# Patient Record
Sex: Female | Born: 1955 | Race: White | Hispanic: No | Marital: Married | Smoking: Never smoker
Health system: Southern US, Community
[De-identification: ages and names within clinical notes are randomized; demographics above are authoritative.]

## PROBLEM LIST (undated history)

## (undated) DIAGNOSIS — R0989 Other specified symptoms and signs involving the circulatory and respiratory systems: Secondary | ICD-10-CM

## (undated) DIAGNOSIS — J3089 Other allergic rhinitis: Secondary | ICD-10-CM

## (undated) DIAGNOSIS — I119 Hypertensive heart disease without heart failure: Secondary | ICD-10-CM

## (undated) DIAGNOSIS — E7800 Pure hypercholesterolemia, unspecified: Secondary | ICD-10-CM

## (undated) DIAGNOSIS — I10 Essential (primary) hypertension: Secondary | ICD-10-CM

## (undated) DIAGNOSIS — I5032 Chronic diastolic (congestive) heart failure: Secondary | ICD-10-CM

## (undated) DIAGNOSIS — D638 Anemia in other chronic diseases classified elsewhere: Secondary | ICD-10-CM

## (undated) HISTORY — PX: ABDOMINAL HYSTERECTOMY: SHX81

## (undated) HISTORY — PX: SHOULDER ARTHROSCOPY: SHX128

---

## 2017-05-12 ENCOUNTER — Observation Stay (HOSPITAL_BASED_OUTPATIENT_CLINIC_OR_DEPARTMENT_OTHER)
Admission: EM | Admit: 2017-05-12 | Discharge: 2017-05-13 | Disposition: A | Discharge status: Home / Self Care | Payer: 59 | Attending: Internal Medicine | Admitting: Internal Medicine

## 2017-05-12 ENCOUNTER — Encounter (HOSPITAL_BASED_OUTPATIENT_CLINIC_OR_DEPARTMENT_OTHER): Payer: Self-pay | Admitting: Emergency Medicine

## 2017-05-12 ENCOUNTER — Emergency Department (HOSPITAL_BASED_OUTPATIENT_CLINIC_OR_DEPARTMENT_OTHER): Payer: 59

## 2017-05-12 DIAGNOSIS — I119 Hypertensive heart disease without heart failure: Secondary | ICD-10-CM

## 2017-05-12 DIAGNOSIS — I110 Hypertensive heart disease with heart failure: Secondary | ICD-10-CM | POA: Diagnosis not present

## 2017-05-12 DIAGNOSIS — I509 Heart failure, unspecified: Secondary | ICD-10-CM

## 2017-05-12 DIAGNOSIS — R079 Chest pain, unspecified: Secondary | ICD-10-CM | POA: Diagnosis present

## 2017-05-12 DIAGNOSIS — D649 Anemia, unspecified: Secondary | ICD-10-CM | POA: Insufficient documentation

## 2017-05-12 DIAGNOSIS — K219 Gastro-esophageal reflux disease without esophagitis: Secondary | ICD-10-CM | POA: Diagnosis not present

## 2017-05-12 DIAGNOSIS — Z79899 Other long term (current) drug therapy: Secondary | ICD-10-CM | POA: Diagnosis not present

## 2017-05-12 DIAGNOSIS — R739 Hyperglycemia, unspecified: Secondary | ICD-10-CM | POA: Insufficient documentation

## 2017-05-12 DIAGNOSIS — R011 Cardiac murmur, unspecified: Secondary | ICD-10-CM | POA: Diagnosis not present

## 2017-05-12 DIAGNOSIS — R946 Abnormal results of thyroid function studies: Secondary | ICD-10-CM | POA: Diagnosis not present

## 2017-05-12 DIAGNOSIS — J9601 Acute respiratory failure with hypoxia: Secondary | ICD-10-CM | POA: Diagnosis not present

## 2017-05-12 DIAGNOSIS — I5031 Acute diastolic (congestive) heart failure: Secondary | ICD-10-CM | POA: Diagnosis not present

## 2017-05-12 DIAGNOSIS — E785 Hyperlipidemia, unspecified: Secondary | ICD-10-CM | POA: Diagnosis not present

## 2017-05-12 DIAGNOSIS — I517 Cardiomegaly: Secondary | ICD-10-CM

## 2017-05-12 DIAGNOSIS — I10 Essential (primary) hypertension: Secondary | ICD-10-CM | POA: Diagnosis present

## 2017-05-12 DIAGNOSIS — R0603 Acute respiratory distress: Secondary | ICD-10-CM | POA: Insufficient documentation

## 2017-05-12 HISTORY — DX: Pure hypercholesterolemia, unspecified: E78.00

## 2017-05-12 HISTORY — DX: Other allergic rhinitis: J30.89

## 2017-05-12 HISTORY — DX: Hypertensive heart disease without heart failure: I11.9

## 2017-05-12 HISTORY — DX: Essential (primary) hypertension: I10

## 2017-05-12 LAB — CBC
HCT: 31.9 % — ABNORMAL LOW (ref 36.0–46.0)
Hemoglobin: 10.6 g/dL — ABNORMAL LOW (ref 12.0–15.0)
MCH: 29.2 pg (ref 26.0–34.0)
MCHC: 33.2 g/dL (ref 30.0–36.0)
MCV: 87.9 fL (ref 78.0–100.0)
PLATELETS: 394 10*3/uL (ref 150–400)
RBC: 3.63 MIL/uL — AB (ref 3.87–5.11)
RDW: 13.5 % (ref 11.5–15.5)
WBC: 7.2 10*3/uL (ref 4.0–10.5)

## 2017-05-12 LAB — BASIC METABOLIC PANEL
ANION GAP: 10 (ref 5–15)
BUN: 24 mg/dL — ABNORMAL HIGH (ref 6–20)
CHLORIDE: 102 mmol/L (ref 101–111)
CO2: 26 mmol/L (ref 22–32)
Calcium: 10.1 mg/dL (ref 8.9–10.3)
Creatinine, Ser: 0.81 mg/dL (ref 0.44–1.00)
GFR calc non Af Amer: >60 mL/min (ref >60)
GLUCOSE: 112 mg/dL — AB (ref 65–99)
Potassium: 3.5 mmol/L (ref 3.5–5.1)
Sodium: 138 mmol/L (ref 135–145)

## 2017-05-12 LAB — D-DIMER, QUANTITATIVE: D-Dimer, Quant: 0.42 ug/mL-FEU (ref 0.00–0.50)

## 2017-05-12 LAB — BRAIN NATRIURETIC PEPTIDE: B NATRIURETIC PEPTIDE 5: 92.3 pg/mL (ref 0.0–100.0)

## 2017-05-12 LAB — TROPONIN I

## 2017-05-12 MED ORDER — FUROSEMIDE 10 MG/ML IJ SOLN
20.0000 mg | Freq: Once | INTRAMUSCULAR | Status: AC
  Administered 2017-05-12: 20 mg via INTRAVENOUS
  Filled 2017-05-12: qty 2

## 2017-05-12 NOTE — ED Triage Notes (Addendum)
Sent from [facility]. Intermittent centralized chest pain x 1 week with fatigue, exertional SOB. Reports nausea this morning. 3 hour flight 1 month ago, non smoker.

## 2017-05-12 NOTE — ED Notes (Signed)
ED Provider at bedside. 

## 2017-05-12 NOTE — ED Provider Notes (Signed)
[facility] Provider Note   CSN: [id number] Arrival date & time: 05/12/17  1714     History   Chief Complaint Chief Complaint  Patient presents with  . Chest Pain    HPI Hannah Colon is a 61 y.o. female.  Patient is a 61 yo female with PMH HTN, HLD who presents from PCP office with c/o SOB and CP. States that has over the past 1.5 weeks she has noticed that she has been getting more "tired and winded" and at the same time started having central intermittent chest pain that would go up to her throat that she describes as a burning sensation. Her chest pain is brought on after drinking coffee or alcohol and relieved with tums. Her SOB has been progressively worsening, she has been unable to walk short distances without significant dyspnea that is relieved by rest. States has some difficulty laying flat. No palpitations. No wheezing. Has no known h/o lung conditions, does not use inhalers and not on oxygen at home.      Past Medical History:  Diagnosis Date  . High cholesterol   . Hypertension     There are no active problems to display for this patient.   Past Surgical History:  Procedure Laterality Date  . ABDOMINAL HYSTERECTOMY    . SHOULDER ARTHROSCOPY Right     OB History    No data available       Home Medications    Prior to Admission medications   Medication Sig Start Date End Date Taking? Authorizing Provider  atorvastatin (LIPITOR) 20 MG tablet Take 20 mg by mouth daily.   Yes [provider]  hydrochlorothiazide (HYDRODIURIL) 25 MG tablet Take 25 mg by mouth daily.   Yes [provider]  losartan (COZAAR) 100 MG tablet Take 100 mg by mouth daily.   Yes [provider]    Family History No family history on file.  Maternal uncle - MI Maternal uncle - CVA Mother - heart murmur, asthma MGM - asthma   Social History Social History  Substance Use Topics  . Smoking status: Never Smoker  . Smokeless tobacco:  Never Used  . Alcohol use No  EtOH 4 drinks per week Recreational drug use - none    Allergies   Penicillins   Review of Systems Review of Systems  Constitutional: Positive for fatigue. Negative for chills, diaphoresis and fever.  HENT: Negative for congestion, rhinorrhea and sinus pressure.   Respiratory: Positive for shortness of breath. Negative for chest tightness and wheezing.   Cardiovascular: Positive for chest pain. Negative for palpitations.  Gastrointestinal: Negative for abdominal distention, abdominal pain, constipation, diarrhea, nausea and vomiting.  Genitourinary: Negative for dysuria and hematuria.     Physical Exam Updated Vital Signs BP 138/74   Pulse 78   Temp 98.5 F (36.9 C) (Oral)   Resp 18   Ht 5' (1.524 m)   Wt 63.5 kg (140 lb)   SpO2 97%   BMI 27.34 kg/m   Physical Exam  Constitutional: She is oriented to person, place, and time. She appears well-developed and well-nourished. No distress.  HENT:  Head: Normocephalic and atraumatic.  Nose: Nose normal.  Mouth/Throat: Oropharynx is clear and moist.  Eyes: Pupils are equal, round, and reactive to light. Conjunctivae and EOM are normal.  Neck: Normal range of motion. Neck supple. JVD present.  Cardiovascular: Normal rate, regular rhythm, normal heart sounds and intact distal pulses.   No murmur heard. Pulmonary/Chest: Effort normal and  breath sounds normal. She has no wheezes.  On 2L O2 via nasal cannula. + crackles bilaterally.  Abdominal: Soft. Bowel sounds are normal. She exhibits no distension and no mass. There is no tenderness. There is no rebound and no guarding.  Musculoskeletal: Normal range of motion. She exhibits no edema.  Neurological: She is alert and oriented to person, place, and time.  Skin: Skin is warm and dry. Capillary refill takes less than 2 seconds.  Psychiatric: She has a normal mood and affect.     ED Treatments / Results  Labs (all labs ordered are listed, but  only abnormal results are displayed) Labs Reviewed  BASIC METABOLIC PANEL - Abnormal; Notable for the following:       Result Value   Glucose, Bld 112 (*)    BUN 24 (*)    All other components within normal limits  CBC - Abnormal; Notable for the following:    RBC 3.63 (*)    Hemoglobin 10.6 (*)    HCT 31.9 (*)    All other components within normal limits  TROPONIN I  D-DIMER, QUANTITATIVE (NOT AT [facility])  BRAIN NATRIURETIC PEPTIDE  TROPONIN I  TROPONIN I  TROPONIN I  BASIC METABOLIC PANEL    EKG  EKG Interpretation None       Radiology Dg Chest 2 View  Result Date: 05/12/2017 CLINICAL DATA:  Chest pain and shortness of breath for 1 week. EXAM: CHEST  2 VIEW COMPARISON:  None. FINDINGS: Upper limits normal heart size noted. Mild interstitial prominence is present. There is no evidence of focal airspace disease, pulmonary edema, suspicious pulmonary nodule/mass, pleural effusion, or pneumothorax. No acute bony abnormalities are identified. IMPRESSION: Mild nonspecific interstitial prominence of uncertain chronicity. Upper limits normal heart size. Electronically Signed   By: Harmon PierJeffrey  Hu M.D.   On: 05/12/2017 18:03    Procedures Procedures (including critical care time)  Medications Ordered in ED Medications  furosemide (LASIX) injection 20 mg (not administered)     Initial Impression / Assessment and Plan / ED Course  I have reviewed the triage vital signs and the nursing notes.  Pertinent labs & imaging results that were available during my care of the patient were reviewed by me and considered in my medical decision making (see chart for details).   Patient is a 61 yo F here with SOB and CP.  She is hypoxic, desat to 89%, requiring 2L O2 via nasal cannula. Has b/l crackles, JVD, new heart murmur and elevated BNP c/w new onset CHF. EKG showing LVH and troponin neg. No concern for ACS. Chest pain appears to be reflux given burning quality and brought on by stomach  irritants (i.e. Caffeine an ETOH) and relieved by antacids. Ddimer neg so no concern for PE. Hospitalist team consulted for admission and accepted. Patient and husband initially requesting transfer via private vehicle so was offered choice of admission with transfer via [facility] or discharge home with outpatient follow up. Patient and husband opted for transfer via [facility] and admission to hospital since she is unable to ambulate to restroom here without dyspnea.   Final Clinical Impressions(s) / ED Diagnoses   Final diagnoses:  Acute congestive heart failure, unspecified heart failure type (HCC)  Gastroesophageal reflux disease, esophagitis presence not specified    New Prescriptions New Prescriptions   No medications on file      Leland HerYoo, Elsia J, DO 05/12/17 2054    Tilden Fossaees, Elizabeth, MD 05/14/17 1538

## 2017-05-12 NOTE — H&P (Signed)
History and Physical    Hannah KandBarbara Shimamoto ZOX:[id number] DOB: [birth year] DOA: 05/12/2017  PCP: Burnis MedinFulbright, Virginia E, PA-C Consultants:  [facility]; [facility] Patient coming from: Home - lives with husband and daughter; Jackey LogeOK: Husband, [phone]  Chief Complaint: SOB  HPI: Hannah Colon is a 61 y.o. female with medical history significant of HTN and HLD presenting with about 10 days of SOB.  Her chest started hurting when she would breathe real hard. Lives in an apartment and by the time she gets to the car or by the time she gets from the car into a building she is out of breath.  Se has to sit to catch her breath for 10-15 minutes for the last 3 days.  Very tired for about 1 1/2 weeks.  Came home from work last Thursday and went straight to bed.  She does describe substernal chest tightness when she breathes deeply or while she is exerting herself.  [facility]  She did not wear the O2 to ambulate to the bathroom and felt the need for it when she got back to bed.  Chest pressure has been on and off throughout.  She takes Tums when it bothers her with intermittent relief.  Went to PCP today and [facility]   ED Course: 89% on RA, new heart murmur, elevated BNP c/w new HCF.  EKG with LVH, troponin negative.  No concern for ACS.  Chest pain thought to be reflux.  D-dimer negative.  Review of Systems: As per HPI; otherwise review of systems reviewed and negative.   Ambulatory Status:   Ambulates without assistance  Past Medical History:  Diagnosis Date  . Environmental and seasonal allergies   . High cholesterol   . Hypertension     Past Surgical History:  Procedure Laterality Date  . ABDOMINAL HYSTERECTOMY    . SHOULDER ARTHROSCOPY Right     Social History   Social History  . Marital status: Married    Spouse name: N/A  . Number of children: N/A  . Years of education: N/A   Occupational History  . accounting    Social History Main Topics  .  Smoking status: Never Smoker  . Smokeless tobacco: Never Used  . Alcohol use 2.4 - 3.0 oz/week    4 - 5 Glasses of wine per week  . Drug use: No  . Sexual activity: Not on file   Other Topics Concern  . Not on file   Social History Narrative  . No narrative on file    Allergies  Allergen Reactions  . Penicillins Hives and Rash    Family History  Problem Relation Age of Onset  . Dementia Mother   . Asthma Maternal Grandmother   . Heart failure Maternal Uncle     Prior to Admission medications   Medication Sig Start Date End Date Taking? Authorizing Provider  atorvastatin (LIPITOR) 20 MG tablet Take 20 mg by mouth daily.   Yes [provider]  hydrochlorothiazide (HYDRODIURIL) 25 MG tablet Take 25 mg by mouth daily.   Yes [provider]  losartan (COZAAR) 100 MG tablet Take 100 mg by mouth daily.   Yes [provider]    Physical Exam: Vitals:   05/12/17 1800 05/12/17 1830 05/12/17 1930 05/12/17 2258  BP: 138/74 134/72 (!) 152/71 126/68  Pulse: 78 78 89 94  Resp: 18 (!) 22 14 16   Temp:    98.2 F (36.8 C)  TempSrc:  Oral  SpO2: 97% 99% 97% 95%  Weight:    62.6 kg (138 lb 0.1 oz)  Height:    5' (1.524 m)     General: Appears calm and comfortable and is NAD, on [place] O2 Eyes:  EOMI, normal lids, iris ENT:  grossly normal hearing, lips & tongue, mmm; appropriate dentition Neck:  no LAD, masses or thyromegaly; no carotid bruits.  Minimal radiation of murmur into right neck. Cardiovascular:  RRR, very loud 4-5/6 systolic murmur, no r/g. No LE edema.  Respiratory:   CTA bilaterally with no wheezes/rales/rhonchi.  Normal respiratory effort. Abdomen:  soft, NT, ND, NABS Back:   normal alignment, no CVAT Skin:  no rash or induration seen on limited exam Musculoskeletal:  grossly normal tone BUE/BLE, good ROM, no bony abnormality Lower extremity:  No LE edema.  Limited foot exam with no ulcerations.  2+ distal pulses. Psychiatric:  grossly  normal mood and affect, speech fluent and appropriate, AOx3 Neurologic:  CN 2-12 grossly intact, moves all extremities in coordinated fashion, sensation intact    Radiological Exams on Admission: Dg Chest 2 View  Result Date: 05/12/2017 CLINICAL DATA:  Chest pain and shortness of breath for 1 week. EXAM: CHEST  2 VIEW COMPARISON:  None. FINDINGS: Upper limits normal heart size noted. Mild interstitial prominence is present. There is no evidence of focal airspace disease, pulmonary edema, suspicious pulmonary nodule/mass, pleural effusion, or pneumothorax. No acute bony abnormalities are identified. IMPRESSION: Mild nonspecific interstitial prominence of uncertain chronicity. Upper limits normal heart size. Electronically Signed   By: Harmon Pier M.D.   On: 05/12/2017 18:03    EKG: Independently reviewed.  NSR with rate 92; LVH with no evidence of acute ischemia   Labs on Admission: I have personally reviewed the available labs and imaging studies at the time of the admission.  Pertinent labs:   Glucose 112 BUN 24/Creatinine 0.81/GFR >60 Hgb 10.6 D-dimer 0.42 Troponin <0.03 BNP 92.3   Assessment/Plan Principal Problem:   Acute respiratory failure with hypoxia (HCC) Active Problems:   Chest pain on exertion   Essential hypertension   Hyperlipidemia   Hyperglycemia   Heart murmur   Acute respiratory failure with hypoxia in conjunction with chest pain and prominent new murmur -Patient without smoking history or prior h/o respiratory failure presenting with worsening SOB and hypoxia  -Normal WBC count, no fever; will not give antibiotics at this time -Borderline BNP -Initial EKG with LVH -CXR with possible increased interstitial prominence -Also with chest pain that may be exertional in nature, substernal, comes and goes spontaneously -Very loud murmur that patient reports has never been described in the past -Ddx includes ACS/CAD; CHF (more likely diastolic dysfunction,  particularly given lack of LE edema and high-normal BNP); and valvular heart disease -Will place in observation status with telemetry -Will request echocardiogram -Will start ASA -Will continue Losartan -No beta blocker due to possible CHF decompensation -CHF order set utilized; may need CHF team consult but will hold until Echo results are available -Cardiology consultation in AM - request placed through [employer] message, patient NPO after midnight -Was given Lasix 20 mg x 1 in ER and will repeat with 20 mg q12h for now -Continue [place] O2 for now -Normal kidney function at this time, will follow -Repeat EKG in AM -Will r/o with serial troponins although doubt ACS based on symptoms and 10 days' duration of symptoms  HTN -Continue Losartan and HCTZ -Given concurrent Lasix dosing, closely monitor renal function and consider holding  HCTZ if creatinine rises  HLD -Continue Lipitor 20 mg for now -Check FLP  Hyperglycemia -May be stress response -Will follow with fasting AM labs and check A1c  DVT prophylaxis:  Lovenox Code Status: Full - confirmed with patient/family Family Communication: Husband present throughout evaluation Disposition Plan:  Home once clinically improved Consults called: Cardiology via [employer] message  Admission status: It is my clinical opinion that referral for OBSERVATION is reasonable and necessary in this patient based on the above information provided. The aforementioned taken together are felt to place the patient at high risk for further clinical deterioration. However it is anticipated that the patient may be medically stable for discharge from the hospital within 24 to 48 hours.    Jonah Blue MD [facility]  If note is complete, please contact covering daytime or nighttime physician. [email or web address] Password [email or web address]  05/13/2017, 12:27 AM

## 2017-05-13 ENCOUNTER — Observation Stay (HOSPITAL_BASED_OUTPATIENT_CLINIC_OR_DEPARTMENT_OTHER): Payer: 59

## 2017-05-13 ENCOUNTER — Encounter (HOSPITAL_COMMUNITY): Payer: Self-pay | Admitting: Cardiovascular Disease

## 2017-05-13 ENCOUNTER — Other Ambulatory Visit: Payer: Self-pay | Admitting: Student

## 2017-05-13 DIAGNOSIS — I5031 Acute diastolic (congestive) heart failure: Secondary | ICD-10-CM

## 2017-05-13 DIAGNOSIS — E7800 Pure hypercholesterolemia, unspecified: Secondary | ICD-10-CM | POA: Diagnosis not present

## 2017-05-13 DIAGNOSIS — R011 Cardiac murmur, unspecified: Secondary | ICD-10-CM | POA: Diagnosis present

## 2017-05-13 DIAGNOSIS — E785 Hyperlipidemia, unspecified: Secondary | ICD-10-CM | POA: Diagnosis present

## 2017-05-13 DIAGNOSIS — I10 Essential (primary) hypertension: Secondary | ICD-10-CM | POA: Diagnosis present

## 2017-05-13 DIAGNOSIS — R079 Chest pain, unspecified: Secondary | ICD-10-CM

## 2017-05-13 DIAGNOSIS — I361 Nonrheumatic tricuspid (valve) insufficiency: Secondary | ICD-10-CM

## 2017-05-13 DIAGNOSIS — J9601 Acute respiratory failure with hypoxia: Secondary | ICD-10-CM | POA: Diagnosis present

## 2017-05-13 DIAGNOSIS — R739 Hyperglycemia, unspecified: Secondary | ICD-10-CM | POA: Diagnosis present

## 2017-05-13 DIAGNOSIS — I517 Cardiomegaly: Secondary | ICD-10-CM | POA: Diagnosis not present

## 2017-05-13 DIAGNOSIS — I110 Hypertensive heart disease with heart failure: Secondary | ICD-10-CM | POA: Diagnosis not present

## 2017-05-13 DIAGNOSIS — K219 Gastro-esophageal reflux disease without esophagitis: Secondary | ICD-10-CM | POA: Diagnosis not present

## 2017-05-13 DIAGNOSIS — I119 Hypertensive heart disease without heart failure: Secondary | ICD-10-CM

## 2017-05-13 HISTORY — DX: Hypertensive heart disease without heart failure: I11.9

## 2017-05-13 LAB — HIV ANTIBODY (ROUTINE TESTING W REFLEX): HIV SCREEN 4TH GENERATION: NONREACTIVE

## 2017-05-13 LAB — LIPID PANEL
Cholesterol: 173 mg/dL (ref 0–200)
HDL: 48 mg/dL (ref >40)
LDL CALC: 70 mg/dL (ref 0–99)
TRIGLYCERIDES: 275 mg/dL — AB (ref <150)
Total CHOL/HDL Ratio: 3.6 RATIO
VLDL: 55 mg/dL — ABNORMAL HIGH (ref 0–40)

## 2017-05-13 LAB — TROPONIN I

## 2017-05-13 LAB — HEMOGLOBIN A1C
HEMOGLOBIN A1C: 5.2 % (ref 4.8–5.6)
MEAN PLASMA GLUCOSE: 102.54 mg/dL

## 2017-05-13 LAB — T4, FREE: FREE T4: 0.92 ng/dL (ref 0.61–1.12)

## 2017-05-13 LAB — ECHOCARDIOGRAM COMPLETE
HEIGHTINCHES: 60 in
WEIGHTICAEL: 2229.29 [oz_av]

## 2017-05-13 LAB — TSH: TSH: 5.387 u[IU]/mL — AB (ref 0.350–4.500)

## 2017-05-13 MED ORDER — FLUTICASONE PROPIONATE 50 MCG/ACT NA SUSP
1.0000 | Freq: Every day | NASAL | Status: DC
  Administered 2017-05-13: 1 via NASAL
  Filled 2017-05-13: qty 16

## 2017-05-13 MED ORDER — AZELASTINE HCL 0.1 % NA SOLN
1.0000 | Freq: Two times a day (BID) | NASAL | Status: DC
  Administered 2017-05-13 (×2): 1 via NASAL
  Filled 2017-05-13: qty 30

## 2017-05-13 MED ORDER — FUROSEMIDE 10 MG/ML IJ SOLN
20.0000 mg | Freq: Two times a day (BID) | INTRAMUSCULAR | Status: DC
  Administered 2017-05-13: 20 mg via INTRAVENOUS
  Filled 2017-05-13: qty 2

## 2017-05-13 MED ORDER — ENOXAPARIN SODIUM 40 MG/0.4ML ~~LOC~~ SOLN
40.0000 mg | SUBCUTANEOUS | Status: DC
  Administered 2017-05-13: 40 mg via SUBCUTANEOUS
  Filled 2017-05-13: qty 0.4

## 2017-05-13 MED ORDER — SODIUM CHLORIDE 0.9% FLUSH: 3.0000 mL | INTRAVENOUS | Status: DC | PRN

## 2017-05-13 MED ORDER — SODIUM CHLORIDE 0.9% FLUSH
3.0000 mL | Freq: Two times a day (BID) | INTRAVENOUS | Status: DC
  Administered 2017-05-13 (×2): 3 mL via INTRAVENOUS

## 2017-05-13 MED ORDER — ADULT MULTIVITAMIN W/MINERALS CH
1.0000 | ORAL_TABLET | Freq: Every day | ORAL | Status: DC
  Administered 2017-05-13: 1 via ORAL
  Filled 2017-05-13: qty 1

## 2017-05-13 MED ORDER — ATORVASTATIN CALCIUM 20 MG PO TABS: 20.0000 mg | ORAL_TABLET | Freq: Every day | ORAL | Status: DC

## 2017-05-13 MED ORDER — METOPROLOL TARTRATE 25 MG PO TABS: 25.0000 mg | ORAL_TABLET | Freq: Two times a day (BID) | ORAL | 30 refills | Status: DC

## 2017-05-13 MED ORDER — METOPROLOL TARTRATE 25 MG PO TABS
25.0000 mg | ORAL_TABLET | Freq: Two times a day (BID) | ORAL | Status: DC
  Administered 2017-05-13: 25 mg via ORAL
  Filled 2017-05-13: qty 1

## 2017-05-13 MED ORDER — LOSARTAN POTASSIUM 50 MG PO TABS
100.0000 mg | ORAL_TABLET | Freq: Every day | ORAL | Status: DC
  Administered 2017-05-13: 100 mg via ORAL
  Filled 2017-05-13: qty 2

## 2017-05-13 MED ORDER — HYDROCHLOROTHIAZIDE 25 MG PO TABS
25.0000 mg | ORAL_TABLET | Freq: Every day | ORAL | Status: DC
  Administered 2017-05-13: 25 mg via ORAL
  Filled 2017-05-13: qty 1

## 2017-05-13 MED ORDER — ONDANSETRON HCL 4 MG/2ML IJ SOLN: 4.0000 mg | Freq: Four times a day (QID) | INTRAMUSCULAR | Status: DC | PRN

## 2017-05-13 MED ORDER — SODIUM CHLORIDE 0.9 % IV SOLN: 250.0000 mL | INTRAVENOUS | Status: DC | PRN

## 2017-05-13 MED ORDER — ASPIRIN 81 MG PO TBEC: 81.0000 mg | DELAYED_RELEASE_TABLET | Freq: Every day | ORAL | 0 refills | Status: AC

## 2017-05-13 MED ORDER — ASPIRIN EC 81 MG PO TBEC
81.0000 mg | DELAYED_RELEASE_TABLET | Freq: Every day | ORAL | Status: DC
  Administered 2017-05-13: 81 mg via ORAL
  Filled 2017-05-13: qty 1

## 2017-05-13 MED ORDER — ACETAMINOPHEN 325 MG PO TABS: 650.0000 mg | ORAL_TABLET | ORAL | Status: DC | PRN

## 2017-05-13 NOTE — Discharge Summary (Signed)
Hannah Colon, is a 61 y.o. female  DOB 01-10-1956  MRN [id number].  Admission date:  05/12/2017  Admitting Physician  Briscoe Deutscher, MD  Discharge Date:  05/13/2017   Primary MD  Burnis Medin, PA-C  Recommendations for primary care physician for things to follow:  - Agent had outpatient stress test scheduled by cardiology.   Admission Diagnosis  Gastroesophageal reflux disease, esophagitis presence not specified [K21.9] Acute congestive heart failure, unspecified heart failure type (HCC) [I50.9]   Discharge Diagnosis  Gastroesophageal reflux disease, esophagitis presence not specified [K21.9] Acute congestive heart failure, unspecified heart failure type (HCC) [I50.9]    Principal Problem:   Acute respiratory failure with hypoxia (HCC) Active Problems:   Chest pain on exertion   Hyperlipidemia   Hyperglycemia   Heart murmur   Acute diastolic heart failure (HCC)   LVH (left ventricular hypertrophy)   Hypertensive heart disease      Past Medical History:  Diagnosis Date  . Environmental and seasonal allergies   . High cholesterol   . Hypertension   . Hypertensive heart disease 05/13/2017    Past Surgical History:  Procedure Laterality Date  . ABDOMINAL HYSTERECTOMY    . SHOULDER ARTHROSCOPY Right        History of present illness and  Hospital Course:     Kindly see H&P for history of present illness and admission details, please review complete Labs, Consult reports and Test reports for all details in brief  HPI  from the history and physical done on the day of admission 05/12/2017  HPI: Hannah Colon is a 61 y.o. female with medical history significant of HTN and HLD presenting with about 10 days of SOB.  Her chest started hurting when she would breathe real hard. Lives in an apartment and by the time she gets to the car or by the time she gets from the car into a  building she is out of breath.  Se has to sit to catch her breath for 10-15 minutes for the last 3 days.  Very tired for about 1 1/2 weeks.  Came home from work last Thursday and went straight to bed.  She does describe substernal chest tightness when she breathes deeply or while she is exerting herself.  She was 89% at [facility].  She did not wear the O2 to ambulate to the bathroom and felt the need for it when she got back to bed.  Chest pressure has been on and off throughout.  She takes Tums when it bothers her with intermittent relief.  Went to PCP today and they sent her to [facility].   ED Course: 89% on RA, new heart murmur, elevated BNP c/w new HCF.  EKG with LVH, troponin negative.  No concern for ACS.  Chest pain thought to be reflux.  D-dimer negative.  Review of Systems: As per HPI; otherwise review of systems reviewed and negative.   Hospital Course   Chest pain - Cardiology input greatly appreciated, she is troponins negative 3, EKG  not concerning for ischemia, her 2-D echo showing normal systolic function, in addition to cardiology to start on low-dose beta blocker, continue with aspirin, and they have scheduled for stress test as an outpatient.  Hypertension - Opinion with home medication losartan and hydrochlorothiazide, started on beta blocker  Hyperlipidemia - LDL 70, continue with atorvastatin  Elevated TSH - Mildly elevated at 5.3, free T4 within normal limit, no indication to treat, can be followed as an outpatient.      Discharge Condition:  stable   Follow UP  Follow-up Information    Chilton Siandolph, Tiffany, MD Follow up on 05/19/2017.   Specialty:  Cardiology Why:  Appointment for Exercise Myoview Stress Test on 05/19/2017 at 7:30AM (nothing to eat or drink after midnight and do not take Lopressor 24 hours prior to the test). Follow-up with Dr. Duke Salviaandolph has been arranged for 06/10/2017 at 8:20AM.  Contact information: [address] [place]  [id number] [phone]             Discharge Instructions  and  Discharge Medications     Discharge Instructions    Discharge instructions    Complete by:  As directed    Follow with Primary MD Hannah Colon, [place] E, PA-C in 7 days   Get CBC, CMP, checked  by Primary MD next visit.    Activity: As tolerated with Full fall precautions use walker/cane & assistance as needed   Disposition Home    Diet: Heart Healthy  , with feeding assistance and aspiration precautions.  For Heart failure patients - Check your Weight same time everyday, if you gain over 2 pounds, or you develop in leg swelling, experience more shortness of breath or chest pain, call your Primary MD immediately. Follow Cardiac Low Salt Diet and 1.5 lit/day fluid restriction.   On your next visit with your primary care physician please Get Medicines reviewed and adjusted.   Please request your Prim.MD to go over all Hospital Tests and Procedure/Radiological results at the follow up, please get all Hospital records sent to your Prim MD by signing hospital release before you go home.   If you experience worsening of your admission symptoms, develop shortness of breath, life threatening emergency, suicidal or homicidal thoughts you must seek medical attention immediately by calling 911 or calling your MD immediately  if symptoms less severe.  You Must read complete instructions/literature along with all the possible adverse reactions/side effects for all the Medicines you take and that have been prescribed to you. Take any new Medicines after you have completely understood and accpet all the possible adverse reactions/side effects.   Do not drive, operating heavy machinery, perform activities at heights, swimming or participation in water activities or provide baby sitting services if your were admitted for syncope or siezures until you have seen by Primary MD or a Neurologist and advised to do so again.  Do not  drive when taking Pain medications.    Do not take more than prescribed Pain, Sleep and Anxiety Medications  Special Instructions: If you have smoked or chewed Tobacco  in the last 2 yrs please stop smoking, stop any regular Alcohol  and or any Recreational drug use.  Wear Seat belts while driving.   Please note  You were cared for by a hospitalist during your hospital stay. If you have any questions about your discharge medications or the care you received while you were in the hospital after you are discharged, you can call the unit and asked  to speak with the hospitalist on call if the hospitalist that took care of you is not available. Once you are discharged, your primary care physician will handle any further medical issues. Please note that NO REFILLS for any discharge medications will be authorized once you are discharged, as it is imperative that you return to your primary care physician (or establish a relationship with a primary care physician if you do not have one) for your aftercare needs so that they can reassess your need for medications and monitor your lab values.   Increase activity slowly    Complete by:  As directed      Allergies as of 05/13/2017      Reactions   Penicillins Hives, Rash      Medication List    TAKE these medications   aspirin 81 MG EC tablet Take 1 tablet (81 mg total) by mouth daily.   atorvastatin 20 MG tablet Commonly known as:  LIPITOR Take 20 mg by mouth daily.   azelastine 0.1 % nasal spray Commonly known as:  ASTELIN Place 1 spray into both nostrils 2 (two) times daily.   fluticasone 50 MCG/ACT nasal spray Commonly known as:  FLONASE Place 1 spray into both nostrils daily.   hydrochlorothiazide 25 MG tablet Commonly known as:  HYDRODIURIL Take 25 mg by mouth daily.   losartan 100 MG tablet Commonly known as:  COZAAR Take 100 mg by mouth daily.   metoprolol tartrate 25 MG tablet Commonly known as:  LOPRESSOR Take 1 tablet  (25 mg total) by mouth 2 (two) times daily.   multivitamin with minerals Tabs tablet Take 1 tablet by mouth daily.         Diet and Activity recommendation: See Discharge Instructions above   Consults obtained - cardiology   Major procedures and Radiology Reports - PLEASE review detailed and final reports for all details, in brief -      Dg Chest 2 View  Result Date: 05/12/2017 CLINICAL DATA:  Chest pain and shortness of breath for 1 week. EXAM: CHEST  2 VIEW COMPARISON:  None. FINDINGS: Upper limits normal heart size noted. Mild interstitial prominence is present. There is no evidence of focal airspace disease, pulmonary edema, suspicious pulmonary nodule/mass, pleural effusion, or pneumothorax. No acute bony abnormalities are identified. IMPRESSION: Mild nonspecific interstitial prominence of uncertain chronicity. Upper limits normal heart size. Electronically Signed   By: Harmon Pier M.D.   On: 05/12/2017 18:03    Micro Results     No results found for this or any previous visit (from the past 240 hour(s)).     Today   Subjective:   Sheril Hammond today has no headache,no further chestpain,no new weakness tingling or numbness, feels much better wants to go home today.   Objective:   Blood pressure 104/77, pulse (!) 104, temperature 97.8 F (36.6 C), temperature source Oral, resp. rate 18, height 5' (1.524 m), weight 63.2 kg (139 lb 5.3 oz), SpO2 97 %.   Intake/Output Summary (Last 24 hours) at 05/13/17 1343 Last data filed at 05/13/17 0600  Gross per 24 hour  Intake                0 ml  Output                0 ml  Net                0 ml    Exam Awake Alert, Oriented x 3, No  new F.N deficits, Normal affect Symmetrical Chest wall movement, Good air movement bilaterally, CTAB RRR,No Gallops,Rubs or new Murmurs, No Parasternal Heave +ve B.Sounds, Abd Soft, Non tender,No rebound -guarding or rigidity. No Cyanosis, Clubbing or edema, No new Rash or  bruise  Data Review   CBC w Diff: Lab Results  Component Value Date   WBC 7.2 05/12/2017   HGB 10.6 (L) 05/12/2017   HCT 31.9 (L) 05/12/2017   PLT 394 05/12/2017    CMP: Lab Results  Component Value Date   NA 138 05/12/2017   K 3.5 05/12/2017   CL 102 05/12/2017   CO2 26 05/12/2017   BUN 24 (H) 05/12/2017   CREATININE 0.81 05/12/2017  .   Total Time in preparing paper work, data evaluation and todays exam - 35 minutes  ELGERGAWY, DAWOOD M.D on 05/13/2017 at 1:43 PM  [facility]   Office  [phone]

## 2017-05-13 NOTE — Consult Note (Signed)
Cardiology Consultation:   Patient ID: Hannah Colon; [id number]; [birth year]   Admit date: 05/12/2017 Date of Consult: 05/13/2017  Primary Care Provider: Burnis MedinFulbright, [place] E, PA-C [facility] Cardiologist: [facility] Dr. Duke Salviaandolph   Patient Profile:   Hannah KandBarbara Colon is a 61 y.o. female with past medical history  of HTN and HLD who is being seen today for the evaluation of chest pain at the request of Dr. Randol KernElgergawy.  History of Present Illness:   [facility]. [facility] for evaluation of chest pain and dyspnea on exertion. She reports having dyspnea on exertion for the past 1.5 weeks, which occurs with walking up the stairs to her apartment or from her car into work. She denies any associated chest discomfort or palpitations with this. No recent orthopnea, PND, or lower extremity edema. She has experienced intermittent episodes of chest pain which occurs at rest. Her pain can last for seconds up to an hour. Pain is typically relieved with TUMS.   She denies any prior history of CAD or prior MI's. No known family history of CAD. Her maternal uncle did have CHF. She denies any prior tobacco use. Does consume 1 glass of wine most days.  O2 saturations were initially at 89% [facility] and she was placed on 2L [place] with improvement into the upper 90's. She denies any pain at this current time. Reports breathing is at baseline.   Initial labs show WBC of 7.2, Hgb 10.6, platelets 394. Na+ 138, K+ 3.5, creatinine 0.81. BNP 92.3. D-dimer 0.42. Initial troponin negative with repeat values pending. CXR showing no acute abnormalities. EKG shows NSR, HR 92, with LVH and nonspecific ST changes along inferior and lateral leads (likely secondary to repol abnormalities (no prior tracings are available for comparison).    Past Medical History:  Diagnosis Date  . Environmental and seasonal allergies   . High cholesterol   . Hypertension     Past Surgical History:  Procedure  Laterality Date  . ABDOMINAL HYSTERECTOMY    . SHOULDER ARTHROSCOPY Right      Inpatient Medications: Scheduled Meds: . aspirin EC  81 mg Oral Daily  . atorvastatin  20 mg Oral q1800  . azelastine  1 spray Each Nare BID  . enoxaparin (LOVENOX) injection  40 mg Subcutaneous Q24H  . fluticasone  1 spray Each Nare Daily  . furosemide  20 mg Intravenous Q12H  . hydrochlorothiazide  25 mg Oral Daily  . losartan  100 mg Oral Daily  . multivitamin with minerals  1 tablet Oral Daily  . sodium chloride flush  3 mL Intravenous Q12H   Continuous Infusions: . sodium chloride     PRN Meds: sodium chloride, acetaminophen, ondansetron (ZOFRAN) IV, sodium chloride flush  Allergies:    Allergies  Allergen Reactions  . Penicillins Hives and Rash    Social History:   Social History   Social History  . Marital status: Married    Spouse name: N/A  . Number of children: N/A  . Years of education: N/A   Occupational History  . accounting    Social History Main Topics  . Smoking status: Never Smoker  . Smokeless tobacco: Never Used  . Alcohol use 2.4 - 3.0 oz/week    4 - 5 Glasses of wine per week  . Drug use: No  . Sexual activity: Not on file   Other Topics Concern  . Not on file   Social History Narrative  .  No narrative on file    Family History:    Family History  Problem Relation Age of Onset  . Dementia Mother   . Asthma Maternal Grandmother   . Heart failure Maternal Uncle      ROS:  Please see the history of present illness.  Review of Systems  Constitution: Negative for chills, decreased appetite and fever.  Cardiovascular: Positive for chest pain and dyspnea on exertion. Negative for irregular heartbeat, leg swelling, near-syncope, palpitations and syncope.  Respiratory: Positive for shortness of breath. Negative for cough, hemoptysis and wheezing.   Endocrine: Positive for heat intolerance.  Musculoskeletal: Negative for muscle weakness and myalgias.    Gastrointestinal: Negative for constipation and diarrhea.  Neurological: Negative for headaches and light-headedness.    All other ROS reviewed and negative.     Physical Exam/Data:   Vitals:   05/12/17 1830 05/12/17 1930 05/12/17 2258 05/13/17 0504  BP: 134/72 (!) 152/71 126/68 99/64  Pulse: 78 89 94 76  Resp: (!) 22 14 16 16   Temp:   98.2 F (36.8 C) 97.8 F (36.6 C)  TempSrc:   Oral Oral  SpO2: 99% 97% 95% 97%  Weight:   138 lb 0.1 oz (62.6 kg) 139 lb 5.3 oz (63.2 kg)  Height:   5' (1.524 m)     Intake/Output Summary (Last 24 hours) at 05/13/17 0830 Last data filed at 05/13/17 0600  Gross per 24 hour  Intake                0 ml  Output                0 ml  Net                0 ml   Filed Weights   05/12/17 1723 05/12/17 2258 05/13/17 0504  Weight: 140 lb (63.5 kg) 138 lb 0.1 oz (62.6 kg) 139 lb 5.3 oz (63.2 kg)   Body mass index is 27.21 kg/m.   General:  Well nourished, well developed Caucasian female appearing in no acute distress.  HEENT: normal Lymph: no adenopathy Neck: no JVD Endocrine:  No thryomegaly Vascular: No carotid bruits; FA pulses 2+ bilaterally without bruits  Cardiac:  normal S1, S2; RRR; 2/6 SEM best appreciated along RUSB.  Lungs:  clear to auscultation bilaterally, no wheezing, rhonchi or rales  Abd: soft, nontender, no hepatomegaly  Ext: no edema Musculoskeletal:  No deformities, BUE and BLE strength normal and equal Skin: warm and dry  Neuro:  CNs 2-12 intact, no focal abnormalities noted Psych:  Normal affect   EKG:  The EKG was personally reviewed and demonstrates:  NSR, HR 92, with LVH and nonspecific ST changes along inferior and lateral leads (likely secondary to repol abnormalities (no prior tracings are available for comparison).    Relevant CV Studies:  None on File  Laboratory Data:  Chemistry  Recent Labs Lab 05/12/17 1734  NA 138  K 3.5  CL 102  CO2 26  GLUCOSE 112*  BUN 24*  CREATININE 0.81  CALCIUM 10.1   GFRNONAA >60  GFRAA >60  ANIONGAP 10    No results for input(s): PROT, ALBUMIN, AST, ALT, ALKPHOS, BILITOT in the last 168 hours. Hematology  Recent Labs Lab 05/12/17 1734  WBC 7.2  RBC 3.63*  HGB 10.6*  HCT 31.9*  MCV 87.9  MCH 29.2  MCHC 33.2  RDW 13.5  PLT 394   Cardiac Enzymes  Recent Labs Lab 05/12/17 1724  TROPONINI <  0.03   No results for input(s): TROPIPOC in the last 168 hours.  BNP  Recent Labs Lab 05/12/17 1734  BNP 92.3    DDimer   Recent Labs Lab 05/12/17 1739  DDIMER 0.42    Radiology/Studies:  Dg Chest 2 View  Result Date: 05/12/2017 CLINICAL DATA:  Chest pain and shortness of breath for 1 week. EXAM: CHEST  2 VIEW COMPARISON:  None. FINDINGS: Upper limits normal heart size noted. Mild interstitial prominence is present. There is no evidence of focal airspace disease, pulmonary edema, suspicious pulmonary nodule/mass, pleural effusion, or pneumothorax. No acute bony abnormalities are identified. IMPRESSION: Mild nonspecific interstitial prominence of uncertain chronicity. Upper limits normal heart size. Electronically Signed   By: Harmon Pier M.D.   On: 05/12/2017 18:03    Assessment and Plan:   1. Dyspnea on Exertion/ Atypical Chest Pain - the patient reports having dyspnea on exertion for the past 1.5 weeks, which occurs with walking up the stairs to her apartment or from her car into work. She denies any associated chest discomfort or palpitations with this. No recent orthopnea, PND, or lower extremity edema. Has experienced chest discomfort at rest which can last for an hour at a time and is typically relieved with TUMS.  -  BNP negative at 92.3. D-dimer 0.42. Initial troponin negative. EKG shows NSR, HR 92, with LVH and nonspecific ST changes along inferior and lateral leads (likely secondary to repol abnormalities (no prior tracings are available for comparison). She does have a SEM on examination.  - STAT Troponin has been ordered as these  were not cycled overnight. Overall, her chest pain seems most consistent with GERD but her new-onset dyspnea on exertion is concerning. An echo has been ordered. I have talked with the technician who will perform her study next so that more information is available. If echo is without acute abnormalities and cardiac enzymes remain negative, consider an outpatient NST. If EF significantly reduced or WMA are noted, she will need to remain inpatient for further ischemic evaluation.   2. HTN - BP has been variable at 99/64 - 161/74 since admission. - continue PTA Losartan 100mg  daily and HCTZ 25mg  daily.   3. HLD - repeat FLP pending. - continue Atorvastatin 20mg  daily.   4. Elevated TSH - TSH at 5.387. She denies any history of hypothyroidism.  - will check Free T4 with AM labs.   5. Anemia - Hgb at 10.6. She denies any evidence of active bleeding.  - per admitting team.  Signed, Ellsworth Lennox, PA-C  05/13/2017 8:30 AM

## 2017-05-13 NOTE — Progress Notes (Signed)
  Echocardiogram 2D Echocardiogram has been performed.  Hannah Colon 05/13/2017, 9:04 AM

## 2017-05-13 NOTE — Care Management Note (Signed)
Case Management Note  Patient Details  Name: Binnie KandBarbara Munter MRN: [id number] Date of Birth: 06-14-1956  Subjective/Objective: 61 y/o f admitted w/Acute resp failure. From home.CM referral for home screen-cardio already following, has pcp, no readmissions. No CM needs.                   Action/Plan:d/c home.   Expected Discharge Date:                  Expected Discharge Plan:  Home/Self Care  In-House Referral:     Discharge planning Services  CM Consult  Post Acute Care Choice:    Choice offered to:     DME Arranged:    DME Agency:     HH Arranged:    HH Agency:     Status of Service:  Completed, signed off  If discussed at [employer] Length of Stay Meetings, dates discussed:    Additional Comments:  Lanier ClamMahabir, Patrisia Faeth, RN 05/13/2017, 11:09 AM

## 2017-05-13 NOTE — Progress Notes (Signed)
D/c to home w/ Dtr ambulating per request.D/c instructions given w/ verbal understanding.

## 2017-05-19 ENCOUNTER — Ambulatory Visit (HOSPITAL_COMMUNITY)
Admission: RE | Admit: 2017-05-19 | Discharge: 2017-05-19 | Disposition: A | Discharge status: Home / Self Care | Payer: 59 | Source: Ambulatory Visit | Attending: Cardiology | Admitting: Cardiology

## 2017-05-19 DIAGNOSIS — R0602 Shortness of breath: Secondary | ICD-10-CM | POA: Insufficient documentation

## 2017-05-19 DIAGNOSIS — R079 Chest pain, unspecified: Secondary | ICD-10-CM | POA: Diagnosis not present

## 2017-05-19 DIAGNOSIS — I2510 Atherosclerotic heart disease of native coronary artery without angina pectoris: Secondary | ICD-10-CM | POA: Diagnosis not present

## 2017-05-19 DIAGNOSIS — I10 Essential (primary) hypertension: Secondary | ICD-10-CM | POA: Insufficient documentation

## 2017-05-19 DIAGNOSIS — R0609 Other forms of dyspnea: Secondary | ICD-10-CM | POA: Insufficient documentation

## 2017-05-19 IMAGING — NM NM MISC PROCEDURE
9 series · 54 of 54 positions shown · non-contrast
Comparison: none

[Series 1: rest sax · 6.4mm · 6.40mm/px · 6 of 21 frames shown]
[frame 2/21]
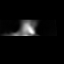
[frame 6/21]
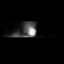
[frame 9/21]
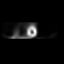
[frame 13/21]
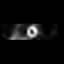
[frame 16/21]
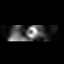
[frame 20/21]
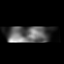

[Series 1: wbr_r-proj_st wbr rest · 6.40mm/px · 6 of 64 frames shown]
[frame 6/64]
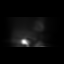
[frame 16/64]
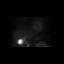
[frame 27/64]
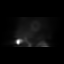
[frame 38/64]
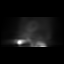
[frame 48/64]
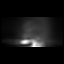
[frame 59/64]
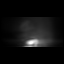

[Series 1: wbr rest · 6.40mm/px · 6 of 64 frames shown]
[frame 6/64]
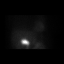
[frame 16/64]
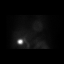
[frame 27/64]
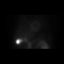
[frame 38/64]
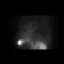
[frame 48/64]
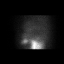
[frame 59/64]
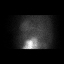

[Series 2: wbr stress-gsp · 6.40mm/px · 6 of 512 frames shown]
[frame 43/512]
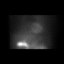
[frame 128/512]
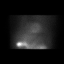
[frame 214/512]
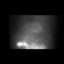
[frame 299/512]
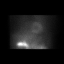
[frame 384/512]
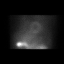
[frame 470/512]
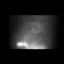

[Series 2: stress sax gs · 6.4mm · 6.40mm/px · 6 of 176 frames shown]
[frame 15/176]
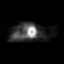
[frame 44/176]
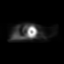
[frame 74/176]
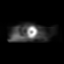
[frame 103/176]
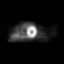
[frame 132/176]
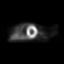
[frame 162/176]
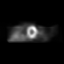

[Series 2: wbr_s-proj_st wbr stress-gsp · 6.40mm/px · 6 of 512 frames shown]
[frame 43/512]
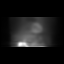
[frame 128/512]
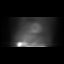
[frame 214/512]
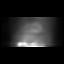
[frame 299/512]
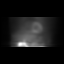
[frame 384/512]
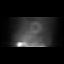
[frame 470/512]
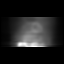

[Series 2: stress sax · 6.4mm · 6.40mm/px · 6 of 22 frames shown]
[frame 2/22]
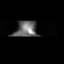
[frame 6/22]
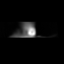
[frame 10/22]
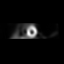
[frame 13/22]
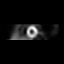
[frame 17/22]
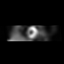
[frame 21/22]
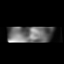

[Series 3: wbr stress-sum-em · 6.40mm/px · 6 of 64 frames shown]
[frame 6/64]
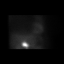
[frame 16/64]
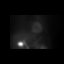
[frame 27/64]
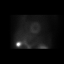
[frame 38/64]
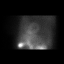
[frame 48/64]
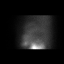
[frame 59/64]
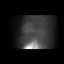

[Series 3: wbr_s-proj_st wbr stress-sum-em · 6.40mm/px · 6 of 64 frames shown]
[frame 6/64]
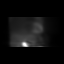
[frame 16/64]
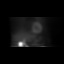
[frame 27/64]
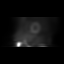
[frame 38/64]
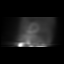
[frame 48/64]
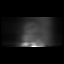
[frame 59/64]
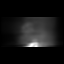

[54 of 54 positions shown; findings below may reference images not displayed]

Canned report from images found in remote index.

Refer to host system for actual result text.

## 2017-05-19 MED ORDER — AMINOPHYLLINE 25 MG/ML IV SOLN
75.0000 mg | Freq: Once | INTRAVENOUS | Status: AC
  Administered 2017-05-19: 75 mg via INTRAVENOUS

## 2017-05-19 MED ORDER — REGADENOSON 0.4 MG/5ML IV SOLN
0.4000 mg | Freq: Once | INTRAVENOUS | Status: AC
  Administered 2017-05-19: 0.4 mg via INTRAVENOUS

## 2017-05-19 MED ORDER — TECHNETIUM TC 99M TETROFOSMIN IV KIT
31.0000 | PACK | Freq: Once | INTRAVENOUS | Status: AC | PRN
  Administered 2017-05-19: 31 via INTRAVENOUS
  Filled 2017-05-19: qty 31

## 2017-05-19 MED ORDER — TECHNETIUM TC 99M TETROFOSMIN IV KIT
10.2000 | PACK | Freq: Once | INTRAVENOUS | Status: AC | PRN
  Administered 2017-05-19: 10.2 via INTRAVENOUS
  Filled 2017-05-19: qty 11

## 2017-05-20 ENCOUNTER — Encounter: Payer: Self-pay | Admitting: Cardiovascular Disease

## 2017-05-20 LAB — MYOCARDIAL PERFUSION IMAGING
CHL CUP NUCLEAR SRS: 0
CHL CUP NUCLEAR SSS: 1
CSEPPHR: 101 {beats}/min
LV dias vol: 101 mL (ref 46–106)
LV sys vol: 31 mL
Rest HR: 80 {beats}/min
SDS: 1
TID: 1.19

## 2017-05-21 ENCOUNTER — Emergency Department (HOSPITAL_COMMUNITY): Payer: 59

## 2017-05-21 ENCOUNTER — Observation Stay (HOSPITAL_COMMUNITY)
Admission: EM | Admit: 2017-05-21 | Discharge: 2017-05-23 | Disposition: A | Discharge status: Home / Self Care | Payer: 59 | Attending: Internal Medicine | Admitting: Internal Medicine

## 2017-05-21 ENCOUNTER — Encounter (HOSPITAL_COMMUNITY): Payer: Self-pay | Admitting: Nurse Practitioner

## 2017-05-21 DIAGNOSIS — Z8249 Family history of ischemic heart disease and other diseases of the circulatory system: Secondary | ICD-10-CM | POA: Insufficient documentation

## 2017-05-21 DIAGNOSIS — R0602 Shortness of breath: Secondary | ICD-10-CM

## 2017-05-21 DIAGNOSIS — Z7982 Long term (current) use of aspirin: Secondary | ICD-10-CM | POA: Insufficient documentation

## 2017-05-21 DIAGNOSIS — Z90710 Acquired absence of both cervix and uterus: Secondary | ICD-10-CM | POA: Diagnosis not present

## 2017-05-21 DIAGNOSIS — Z880 Allergy status to penicillin: Secondary | ICD-10-CM | POA: Diagnosis not present

## 2017-05-21 DIAGNOSIS — Z825 Family history of asthma and other chronic lower respiratory diseases: Secondary | ICD-10-CM | POA: Insufficient documentation

## 2017-05-21 DIAGNOSIS — I5033 Acute on chronic diastolic (congestive) heart failure: Secondary | ICD-10-CM | POA: Diagnosis not present

## 2017-05-21 DIAGNOSIS — R079 Chest pain, unspecified: Secondary | ICD-10-CM | POA: Insufficient documentation

## 2017-05-21 DIAGNOSIS — Z79899 Other long term (current) drug therapy: Secondary | ICD-10-CM | POA: Diagnosis not present

## 2017-05-21 DIAGNOSIS — I2510 Atherosclerotic heart disease of native coronary artery without angina pectoris: Secondary | ICD-10-CM | POA: Diagnosis not present

## 2017-05-21 DIAGNOSIS — E7800 Pure hypercholesterolemia, unspecified: Secondary | ICD-10-CM | POA: Insufficient documentation

## 2017-05-21 DIAGNOSIS — I119 Hypertensive heart disease without heart failure: Secondary | ICD-10-CM | POA: Diagnosis not present

## 2017-05-21 DIAGNOSIS — Z820 Family history of epilepsy and other diseases of the nervous system: Secondary | ICD-10-CM | POA: Insufficient documentation

## 2017-05-21 DIAGNOSIS — J189 Pneumonia, unspecified organism: Secondary | ICD-10-CM | POA: Diagnosis present

## 2017-05-21 DIAGNOSIS — I10 Essential (primary) hypertension: Secondary | ICD-10-CM | POA: Diagnosis not present

## 2017-05-21 DIAGNOSIS — J9601 Acute respiratory failure with hypoxia: Principal | ICD-10-CM | POA: Insufficient documentation

## 2017-05-21 DIAGNOSIS — I5031 Acute diastolic (congestive) heart failure: Secondary | ICD-10-CM | POA: Diagnosis present

## 2017-05-21 LAB — BASIC METABOLIC PANEL
ANION GAP: 11 (ref 5–15)
BUN: 15 mg/dL (ref 6–20)
CALCIUM: 9.3 mg/dL (ref 8.9–10.3)
CO2: 24 mmol/L (ref 22–32)
Chloride: 98 mmol/L — ABNORMAL LOW (ref 101–111)
Creatinine, Ser: 0.76 mg/dL (ref 0.44–1.00)
GFR calc Af Amer: >60 mL/min (ref >60)
GFR calc non Af Amer: >60 mL/min (ref >60)
Glucose, Bld: 121 mg/dL — ABNORMAL HIGH (ref 65–99)
POTASSIUM: 3.7 mmol/L (ref 3.5–5.1)
Sodium: 133 mmol/L — ABNORMAL LOW (ref 135–145)

## 2017-05-21 LAB — CBC
HEMATOCRIT: 30.9 % — AB (ref 36.0–46.0)
HEMOGLOBIN: 10.4 g/dL — AB (ref 12.0–15.0)
MCH: 29.1 pg (ref 26.0–34.0)
MCHC: 33.7 g/dL (ref 30.0–36.0)
MCV: 86.6 fL (ref 78.0–100.0)
Platelets: 399 10*3/uL (ref 150–400)
RBC: 3.57 MIL/uL — ABNORMAL LOW (ref 3.87–5.11)
RDW: 13.5 % (ref 11.5–15.5)
WBC: 18.7 10*3/uL — ABNORMAL HIGH (ref 4.0–10.5)

## 2017-05-21 LAB — POCT I-STAT TROPONIN I: Troponin i, poc: 0 ng/mL (ref 0.00–0.08)

## 2017-05-21 MED ORDER — IOPAMIDOL (ISOVUE-370) INJECTION 76%
100.0000 mL | Freq: Once | INTRAVENOUS | Status: AC | PRN
  Administered 2017-05-21: 65 mL via INTRAVENOUS

## 2017-05-21 MED ORDER — IOPAMIDOL (ISOVUE-370) INJECTION 76%
INTRAVENOUS | Status: AC
  Filled 2017-05-21: qty 100

## 2017-05-21 NOTE — ED Triage Notes (Signed)
Pt states while sitting on her recliner this afternoon, she noticed she is experiencing increased chest pain and shortness of breath. Recently hospitalized for similar symptoms. The chest pain 6/10 she describes is mid chest radiating to the upper back.

## 2017-05-21 NOTE — ED Provider Notes (Signed)
[facility]-EMERGENCY DEPT Provider Note   CSN: [id number] Arrival date & time: 05/21/17  1925     History   Chief Complaint Chief Complaint  Patient presents with  . Chest Pain  . Shortness of Breath    HPI Hannah Colon is a 61 y.o. female.  Patient is a 61 year old female with history of hypertension, high cholesterol, and seasonal allergies. She presents today for evaluation of dyspnea. She has been having issues with tightness in her chest and shortness of breath for the past week. She was seen here and was admitted overnight, then discharged. She underwent an echocardiogram and stress test following discharge, both of which have been unremarkable. This evening she began feeling tight in the chest again and having difficulty breathing and presents for evaluation of this. She denies any fevers, chills, or productive cough. She denies any swelling in her legs or calf pain.   The history is provided by the patient.  Shortness of Breath  This is a new problem. The average episode lasts 1 week. The problem occurs continuously.The problem has been gradually worsening. Associated symptoms include chest pain. Pertinent negatives include no fever, no cough, no sputum production, no leg pain and no leg swelling. She has tried nothing for the symptoms. The treatment provided no relief. Associated medical issues do not include asthma or COPD.    Past Medical History:  Diagnosis Date  . Environmental and seasonal allergies   . High cholesterol   . Hypertension   . Hypertensive heart disease 05/13/2017    Patient Active Problem List   Diagnosis Date Noted  . Acute respiratory failure with hypoxia (HCC) 05/13/2017  . Chest pain on exertion 05/13/2017  . Hyperlipidemia 05/13/2017  . Hyperglycemia 05/13/2017  . Heart murmur 05/13/2017  . Hypertensive heart disease 05/13/2017  . Acute diastolic heart failure (HCC)   . LVH (left ventricular hypertrophy)   . Respiratory distress 05/12/2017     Past Surgical History:  Procedure Laterality Date  . ABDOMINAL HYSTERECTOMY    . SHOULDER ARTHROSCOPY Right     OB History    No data available       Home Medications    Prior to Admission medications   Medication Sig Start Date End Date Taking? Authorizing Provider  aspirin EC 81 MG EC tablet Take 1 tablet (81 mg total) by mouth daily. 05/14/17  Yes Elgergawy, Leana Roe, MD  atorvastatin (LIPITOR) 20 MG tablet Take 20 mg by mouth daily.   Yes [provider]  azelastine (ASTELIN) 0.1 % nasal spray Place 1 spray into both nostrils 2 (two) times daily.   Yes [provider]  fluticasone (FLONASE) 50 MCG/ACT nasal spray Place 1 spray into both nostrils daily.   Yes [provider]  hydrochlorothiazide (HYDRODIURIL) 25 MG tablet Take 25 mg by mouth daily.   Yes [provider]  losartan (COZAAR) 100 MG tablet Take 50 mg by mouth daily.    Yes [provider]  metoprolol tartrate (LOPRESSOR) 25 MG tablet Take 1 tablet (25 mg total) by mouth 2 (two) times daily. 05/13/17  Yes Elgergawy, Leana Roe, MD  Multiple Vitamin (MULTIVITAMIN WITH MINERALS) TABS tablet Take 1 tablet by mouth daily.   Yes [provider]    Family History Family History  Problem Relation Age of Onset  . Dementia Mother   . Asthma Maternal Grandmother   . Heart failure Maternal Uncle     Social History Social History  Substance Use Topics  . Smoking  status: Never Smoker  . Smokeless tobacco: Never Used  . Alcohol use 2.4 - 3.0 oz/week    4 - 5 Glasses of wine per week     Allergies   Penicillins   Review of Systems Review of Systems  Constitutional: Negative for fever.  Respiratory: Positive for shortness of breath. Negative for cough and sputum production.   Cardiovascular: Positive for chest pain. Negative for leg swelling.  All other systems reviewed and are negative.    Physical Exam Updated Vital Signs BP 133/78 (BP Location: Left  Arm)   Pulse (!) 116   Temp 98.9 F (37.2 C) (Oral)   Resp 20   SpO2 (!) 85%   Physical Exam  Constitutional: She is oriented to person, place, and time. She appears well-developed and well-nourished. No distress.  HENT:  Head: Normocephalic and atraumatic.  Neck: Normal range of motion. Neck supple.  Cardiovascular: Normal rate and regular rhythm.  Exam reveals no gallop and no friction rub.   No murmur heard. Pulmonary/Chest: Effort normal and breath sounds normal. No respiratory distress. She has no wheezes.  Abdominal: Soft. Bowel sounds are normal. She exhibits no distension. There is no tenderness.  Musculoskeletal: Normal range of motion. She exhibits no edema.  There is swelling, tenderness, or edema. Homans sign is absent bilaterally.  Neurological: She is alert and oriented to person, place, and time.  Skin: Skin is warm and dry. She is not diaphoretic.  Nursing note and vitals reviewed.    ED Treatments / Results  Labs (all labs ordered are listed, but only abnormal results are displayed) Labs Reviewed  BASIC METABOLIC PANEL - Abnormal; Notable for the following:       Result Value   Sodium 133 (*)    Chloride 98 (*)    Glucose, Bld 121 (*)    All other components within normal limits  CBC - Abnormal; Notable for the following:    WBC 18.7 (*)    RBC 3.57 (*)    Hemoglobin 10.4 (*)    HCT 30.9 (*)    All other components within normal limits  I-STAT TROPONIN, ED  POCT I-STAT TROPONIN I    EKG  EKG Interpretation  Date/Time:  Saturday May 21 2017 19:29:58 EDT Ventricular Rate:  115 PR Interval:    QRS Duration: 98 QT Interval:  317 QTC Calculation: 439 R Axis:   53 Text Interpretation:  Sinus tachycardia , rate faster since last tracing Probable LVH with secondary repol abnrm ST depression, consider ischemia, diffuse lds , new since last tracing Baseline wander in lead(s) II aVF Confirmed by Linwood Dibbles [phone]) on 05/21/2017 8:15:32 PM        Radiology Dg Chest 2 View  Result Date: 05/21/2017 CLINICAL DATA:  Shortness of breath and chest pain EXAM: CHEST  2 VIEW COMPARISON:  None. FINDINGS: The lungs are clear. The heart size and pulmonary vascularity are normal. No adenopathy. There is anterior wedging of the T10 vertebral body, age uncertain. There is midthoracic dextroscoliosis. IMPRESSION: No edema or consolidation. Heart size is within normal limits. There is age uncertain anterior wedging of the T10 vertebral body. Electronically Signed   By: Bretta Bang III M.D.   On: 05/21/2017 20:04    Procedures Procedures (including critical care time)  Medications Ordered in ED Medications - No data to display   Initial Impression / Assessment and Plan / ED Course  I have reviewed the triage vital signs and the nursing notes.  Pertinent  labs & imaging results that were available during my care of the patient were reviewed by me and considered in my medical decision making (see chart for details).  Patient with persistent dyspnea ongoing for the past week. She had extensive cardiac workup this week, however no cause was found. It became worse today and she presents for evaluation of this. She is hypoxic with oxygen saturations of 85% upon presentation. This improves with supplemental oxygen by nasal cannula, however she desaturates when the oxygen is turned off.  My initial concern was for a pulmonary embolism. For this reason, she underwent a CT scan, however no PE was found. It did show bilateral airspace opacities raising the suspicion for pulmonary edema and less likely infection. She does have a white count of 19,000, the significance of which I am uncertain, however this does go along with an infectious cause.  At this point, she remains hypoxic and I do not feel as though she can be safely discharged. I've spoken with Dr. Antionette Char from the hospitalist service who will evaluate and likely admit.  Final Clinical  Impressions(s) / ED Diagnoses   Final diagnoses:  None    New Prescriptions New Prescriptions   No medications on file     Geoffery Lyons, MD 05/22/17 [phone]

## 2017-05-21 NOTE — ED Notes (Signed)
Unable to collect labs at this time patient going to xray 

## 2017-05-22 ENCOUNTER — Encounter (HOSPITAL_COMMUNITY): Payer: Self-pay | Admitting: Family Medicine

## 2017-05-22 DIAGNOSIS — J9601 Acute respiratory failure with hypoxia: Secondary | ICD-10-CM

## 2017-05-22 DIAGNOSIS — R079 Chest pain, unspecified: Secondary | ICD-10-CM | POA: Diagnosis not present

## 2017-05-22 DIAGNOSIS — I5031 Acute diastolic (congestive) heart failure: Secondary | ICD-10-CM | POA: Diagnosis not present

## 2017-05-22 DIAGNOSIS — R0602 Shortness of breath: Secondary | ICD-10-CM | POA: Insufficient documentation

## 2017-05-22 DIAGNOSIS — J189 Pneumonia, unspecified organism: Secondary | ICD-10-CM | POA: Diagnosis not present

## 2017-05-22 LAB — CBC WITH DIFFERENTIAL/PLATELET
BASOS PCT: 1 %
Basophils Absolute: 0.1 10*3/uL (ref 0.0–0.1)
EOS PCT: 2 %
Eosinophils Absolute: 0.2 10*3/uL (ref 0.0–0.7)
HEMATOCRIT: 31.3 % — AB (ref 36.0–46.0)
Hemoglobin: 10.5 g/dL — ABNORMAL LOW (ref 12.0–15.0)
Lymphocytes Relative: 13 %
Lymphs Abs: 1.3 10*3/uL (ref 0.7–4.0)
MCH: 28.6 pg (ref 26.0–34.0)
MCHC: 33.5 g/dL (ref 30.0–36.0)
MCV: 85.3 fL (ref 78.0–100.0)
MONO ABS: 0.5 10*3/uL (ref 0.1–1.0)
MONOS PCT: 5 %
NEUTROS ABS: 8.2 10*3/uL — AB (ref 1.7–7.7)
Neutrophils Relative %: 79 %
Platelets: 392 10*3/uL (ref 150–400)
RBC: 3.67 MIL/uL — ABNORMAL LOW (ref 3.87–5.11)
RDW: 13.5 % (ref 11.5–15.5)
WBC: 10.3 10*3/uL (ref 4.0–10.5)

## 2017-05-22 LAB — STREP PNEUMONIAE URINARY ANTIGEN: Strep Pneumo Urinary Antigen: NEGATIVE

## 2017-05-22 LAB — PROCALCITONIN: PROCALCITONIN: 0.15 ng/mL

## 2017-05-22 LAB — TROPONIN I: Troponin I: <0.03 ng/mL (ref <0.03)

## 2017-05-22 LAB — BRAIN NATRIURETIC PEPTIDE: B NATRIURETIC PEPTIDE 5: 78.1 pg/mL (ref 0.0–100.0)

## 2017-05-22 LAB — HIV ANTIBODY (ROUTINE TESTING W REFLEX): HIV SCREEN 4TH GENERATION: NONREACTIVE

## 2017-05-22 LAB — MRSA PCR SCREENING: MRSA BY PCR: NEGATIVE

## 2017-05-22 MED ORDER — LOSARTAN POTASSIUM 50 MG PO TABS
50.0000 mg | ORAL_TABLET | Freq: Every day | ORAL | Status: DC
  Administered 2017-05-22 – 2017-05-23 (×2): 50 mg via ORAL
  Filled 2017-05-22 (×2): qty 1

## 2017-05-22 MED ORDER — ASPIRIN EC 81 MG PO TBEC
81.0000 mg | DELAYED_RELEASE_TABLET | Freq: Every day | ORAL | Status: DC
  Administered 2017-05-22 – 2017-05-23 (×2): 81 mg via ORAL
  Filled 2017-05-22 (×2): qty 1

## 2017-05-22 MED ORDER — FUROSEMIDE 10 MG/ML IJ SOLN
20.0000 mg | Freq: Once | INTRAMUSCULAR | Status: AC
  Administered 2017-05-22: 20 mg via INTRAVENOUS
  Filled 2017-05-22: qty 4

## 2017-05-22 MED ORDER — LEVOFLOXACIN IN D5W 750 MG/150ML IV SOLN
750.0000 mg | Freq: Every day | INTRAVENOUS | Status: DC
  Administered 2017-05-22 (×2): 750 mg via INTRAVENOUS
  Filled 2017-05-22 (×3): qty 150

## 2017-05-22 MED ORDER — ACETAMINOPHEN 325 MG PO TABS
650.0000 mg | ORAL_TABLET | ORAL | Status: DC | PRN
  Administered 2017-05-22 – 2017-05-23 (×2): 650 mg via ORAL
  Filled 2017-05-22 (×2): qty 2

## 2017-05-22 MED ORDER — ATORVASTATIN CALCIUM 20 MG PO TABS
20.0000 mg | ORAL_TABLET | Freq: Every day | ORAL | Status: DC
  Administered 2017-05-22 – 2017-05-23 (×2): 20 mg via ORAL
  Filled 2017-05-22 (×2): qty 1

## 2017-05-22 MED ORDER — SODIUM CHLORIDE 0.9 % IV SOLN: 250.0000 mL | INTRAVENOUS | Status: DC | PRN

## 2017-05-22 MED ORDER — METOPROLOL TARTRATE 25 MG PO TABS
25.0000 mg | ORAL_TABLET | Freq: Two times a day (BID) | ORAL | Status: DC
Start: 2017-05-22 — End: 2017-05-23
  Administered 2017-05-22 – 2017-05-23 (×4): 25 mg via ORAL
  Filled 2017-05-22 (×4): qty 1

## 2017-05-22 MED ORDER — FUROSEMIDE 10 MG/ML IJ SOLN
20.0000 mg | Freq: Every day | INTRAMUSCULAR | Status: DC
  Administered 2017-05-22 – 2017-05-23 (×2): 20 mg via INTRAVENOUS
  Filled 2017-05-22 (×2): qty 2

## 2017-05-22 MED ORDER — AZELASTINE HCL 0.1 % NA SOLN
1.0000 | Freq: Two times a day (BID) | NASAL | Status: DC
  Administered 2017-05-22 – 2017-05-23 (×3): 1 via NASAL
  Filled 2017-05-22 (×2): qty 30

## 2017-05-22 MED ORDER — ONDANSETRON HCL 4 MG/2ML IJ SOLN
4.0000 mg | Freq: Four times a day (QID) | INTRAMUSCULAR | Status: DC | PRN
  Administered 2017-05-22: 4 mg via INTRAVENOUS
  Filled 2017-05-22: qty 2

## 2017-05-22 MED ORDER — ADULT MULTIVITAMIN W/MINERALS CH
1.0000 | ORAL_TABLET | Freq: Every day | ORAL | Status: DC
  Administered 2017-05-22 – 2017-05-23 (×2): 1 via ORAL
  Filled 2017-05-22 (×2): qty 1

## 2017-05-22 MED ORDER — SODIUM CHLORIDE 0.9% FLUSH: 3.0000 mL | INTRAVENOUS | Status: DC | PRN

## 2017-05-22 MED ORDER — SODIUM CHLORIDE 0.9% FLUSH
3.0000 mL | Freq: Two times a day (BID) | INTRAVENOUS | Status: DC
  Administered 2017-05-22 – 2017-05-23 (×4): 3 mL via INTRAVENOUS

## 2017-05-22 MED ORDER — ENOXAPARIN SODIUM 40 MG/0.4ML ~~LOC~~ SOLN
40.0000 mg | Freq: Every day | SUBCUTANEOUS | Status: DC
  Administered 2017-05-22 (×2): 40 mg via SUBCUTANEOUS
  Filled 2017-05-22 (×2): qty 0.4

## 2017-05-22 MED ORDER — FLUTICASONE PROPIONATE 50 MCG/ACT NA SUSP
1.0000 | Freq: Every day | NASAL | Status: DC
  Administered 2017-05-22 – 2017-05-23 (×2): 1 via NASAL
  Filled 2017-05-22 (×2): qty 16

## 2017-05-22 NOTE — H&P (Signed)
History and Physical    Hannah Colon ZOX:[id number] DOB: March 15, 1956 DOA: 05/21/2017  PCP: Burnis Medin, PA-C   Patient coming from: Home  Chief Complaint: Chest pain, SOB   HPI: Hannah Colon is a 61 y.o. female with medical history significant for hypertension, seasonal allergy, and recent diagnosis of diastolic CHF, now presenting to the emergency department for evaluation of dyspnea and chest pain. Patient reports that she was in her usual state of health and having an uneventful day yesterday until the insidious development of dyspnea with cough. She remained well otherwise until this evening, while seated in her recliner, when she noted discomfort in the central chest with radiation to the back and worse shortness of breath. She reports a cough over the past couple days, occasionally productive of thick white sputum. Chest discomfort has been constant, moderate in intensity, worse with deep inspiration, and better with supplemental O2 in ED. She was just observed in the hospital 10 days ago for these symptoms, had serial cardiac enzymes were negative and echocardiogram revealing mild valvular disease and grade 1 diastolic dysfunction with preserved EF. She underwent a nuclear medicine stress test just 2 days ago which was normal. Denies fevers or chills.   ED Course: Upon arrival to the ED, patient is found to be afebrile, saturating mid 80s on room air, tachycardic, and with vitals otherwise stable. EKG features a sinus tachycardia with rate 115 and LVH by voltage criteria with repolarization abnormality. Chest x-ray is negative for acute cardiopulmonary disease and CTA chest was obtained, negative for PE, but notable for diffuse interstitial prominence with hazy bilateral airspace opacities concerning for pulmonary edema, or possibly infection. Chemistry panel reveals a sodium of 133 and CBC is notable for a stable normocytic anemia with hemoglobin of 10.4 and a new leukocytosis to  18,700. Troponin is undetectable. Patient was started on supplemental oxygen the emergency department and desatted to the mid 80s with attempts at removing. She will be admitted to the telemetry unit for ongoing evaluation and management of acute hypoxic respiratory failure suspected to be multifactorial with contributions from acute on chronic diastolic CHF and atypical pneumonia.  Review of Systems:  All other systems reviewed and apart from HPI, are negative.  Past Medical History:  Diagnosis Date  . Environmental and seasonal allergies   . High cholesterol   . Hypertension   . Hypertensive heart disease 05/13/2017    Past Surgical History:  Procedure Laterality Date  . ABDOMINAL HYSTERECTOMY    . SHOULDER ARTHROSCOPY Right      reports that she has never smoked. She has never used smokeless tobacco. She reports that she drinks about 2.4 - 3.0 oz of alcohol per week . She reports that she does not use drugs.  Allergies  Allergen Reactions  . Penicillins Hives and Rash    Has patient had a PCN reaction causing immediate rash, facial/tongue/throat swelling, SOB or lightheadedness with hypotension: No Has patient had a PCN reaction causing severe rash involving mucus membranes or skin necrosis: No Has patient had a PCN reaction that required hospitalization: No Has patient had a PCN reaction occurring within the last 10 years: Yes If all of the above answers are "NO", then may proceed with Cephalosporin use.     Family History  Problem Relation Age of Onset  . Dementia Mother   . Asthma Maternal Grandmother   . Heart failure Maternal Uncle      Prior to Admission medications   Medication Sig Start Date  End Date Taking? Authorizing Provider  aspirin EC 81 MG EC tablet Take 1 tablet (81 mg total) by mouth daily. 05/14/17  Yes Elgergawy, Leana Roe, MD  atorvastatin (LIPITOR) 20 MG tablet Take 20 mg by mouth daily.   Yes [provider]  azelastine (ASTELIN) 0.1 % nasal  spray Place 1 spray into both nostrils 2 (two) times daily.   Yes [provider]  fluticasone (FLONASE) 50 MCG/ACT nasal spray Place 1 spray into both nostrils daily.   Yes [provider]  hydrochlorothiazide (HYDRODIURIL) 25 MG tablet Take 25 mg by mouth daily.   Yes [provider]  losartan (COZAAR) 100 MG tablet Take 50 mg by mouth daily.    Yes [provider]  metoprolol tartrate (LOPRESSOR) 25 MG tablet Take 1 tablet (25 mg total) by mouth 2 (two) times daily. 05/13/17  Yes Elgergawy, Leana Roe, MD  Multiple Vitamin (MULTIVITAMIN WITH MINERALS) TABS tablet Take 1 tablet by mouth daily.   Yes [provider]    Physical Exam: Vitals:   05/21/17 1930 05/21/17 2257  BP: 133/78 136/75  Pulse: (!) 116 65  Resp: 20 18  Temp: 98.9 F (37.2 C)   TempSrc: Oral   SpO2: (!) 85% 94%      Constitutional: NAD, calm, dyspneic with speech Eyes: PERTLA, lids and conjunctivae normal ENMT: Mucous membranes are moist. Posterior pharynx clear of any exudate or lesions.   Neck: normal, supple, no masses, no thyromegaly Respiratory: Coarse rales in bases and mid-lung zones, no wheezing. No accessory muscle use.  Cardiovascular: S1 & S2 heard, regular rate and rhythm, grade 4 holosystolic murmur throughout precordium. No extremity edema.  Abdomen: No distension, no tenderness, no masses palpated. Bowel sounds active.  Musculoskeletal: no clubbing / cyanosis. No joint deformity upper and lower extremities.   Skin: no significant rashes, lesions, ulcers. Warm, dry, well-perfused. Neurologic: CN 2-12 grossly intact. Sensation intact, DTR normal. Strength 5/5 in all 4 limbs.  Psychiatric: Alert and oriented x 3. Calm, cooperative.     Labs on Admission: I have personally reviewed following labs and imaging studies  CBC:  Recent Labs Lab 05/21/17 2006  WBC 18.7*  HGB 10.4*  HCT 30.9*  MCV 86.6  PLT 399   Basic Metabolic Panel:  Recent  Labs Lab 05/21/17 2006  NA 133*  K 3.7  CL 98*  CO2 24  GLUCOSE 121*  BUN 15  CREATININE 0.76  CALCIUM 9.3   GFR: Estimated Creatinine Clearance: 62 mL/min (by C-G formula based on SCr of 0.76 mg/dL). Liver Function Tests: No results for input(s): AST, ALT, ALKPHOS, BILITOT, PROT, ALBUMIN in the last 168 hours. No results for input(s): LIPASE, AMYLASE in the last 168 hours. No results for input(s): AMMONIA in the last 168 hours. Coagulation Profile: No results for input(s): INR, PROTIME in the last 168 hours. Cardiac Enzymes: No results for input(s): CKTOTAL, CKMB, CKMBINDEX, TROPONINI in the last 168 hours. BNP (last 3 results) No results for input(s): PROBNP in the last 8760 hours. HbA1C: No results for input(s): HGBA1C in the last 72 hours. CBG: No results for input(s): GLUCAP in the last 168 hours. Lipid Profile: No results for input(s): CHOL, HDL, LDLCALC, TRIG, CHOLHDL, LDLDIRECT in the last 72 hours. Thyroid Function Tests: No results for input(s): TSH, T4TOTAL, FREET4, T3FREE, THYROIDAB in the last 72 hours. Anemia Panel: No results for input(s): VITAMINB12, FOLATE, FERRITIN, TIBC, IRON, RETICCTPCT in the last 72 hours. Urine analysis: No results found for: COLORURINE, APPEARANCEUR,  LABSPEC, PHURINE, GLUCOSEU, HGBUR, BILIRUBINUR, KETONESUR, PROTEINUR, UROBILINOGEN, NITRITE, LEUKOCYTESUR Sepsis Labs: @LABRCNTIP (procalcitonin:4,lacticidven:4) )No results found for this or any previous visit (from the past 240 hour(s)).   Radiological Exams on Admission: Dg Chest 2 View  Result Date: 05/21/2017 CLINICAL DATA:  Shortness of breath and chest pain EXAM: CHEST  2 VIEW COMPARISON:  None. FINDINGS: The lungs are clear. The heart size and pulmonary vascularity are normal. No adenopathy. There is anterior wedging of the T10 vertebral body, age uncertain. There is midthoracic dextroscoliosis. IMPRESSION: No edema or consolidation. Heart size is within normal limits. There is  age uncertain anterior wedging of the T10 vertebral body. Electronically Signed   By: Bretta Bang III M.D.   On: 05/21/2017 20:04   Ct Angio Chest Pe W And/or Wo Contrast  Result Date: 05/21/2017 CLINICAL DATA:  Acute onset of mid chest pain and upper back pain. Shortness of breath. Initial encounter. EXAM: CT ANGIOGRAPHY CHEST WITH CONTRAST TECHNIQUE: Multidetector CT imaging of the chest was performed using the standard protocol during bolus administration of intravenous contrast. Multiplanar CT image reconstructions and MIPs were obtained to evaluate the vascular anatomy. CONTRAST:  65 mL of Isovue 370 IV contrast COMPARISON:  Chest radiograph performed earlier today at 7:53 p.m. FINDINGS: Cardiovascular:  There is no evidence of pulmonary embolus. The heart is normal in size. Diffuse coronary artery calcifications are seen. Mild calcification is noted at the mitral valve. The thoracic aorta is grossly unremarkable. The great vessels are within normal limits. Mediastinum/Nodes: Visualized mediastinal nodes remain borderline normal in size. No pericardial effusion is identified. The thyroid gland is grossly unremarkable. No axillary lymphadenopathy is appreciated. Lungs/Pleura: Diffuse interstitial prominence is noted, with hazy bilateral airspace opacities. This is concerning for pulmonary edema. Infection is considered less likely. No pleural effusion or pneumothorax is seen. No masses are identified. Upper Abdomen: The visualized portions of the liver and spleen are unremarkable. The visualized portions of the pancreas and left adrenal gland are within normal limits. Musculoskeletal: No acute osseous abnormalities are identified. There is chronic compression deformity of vertebral body T10. The visualized musculature is unremarkable in appearance. Review of the MIP images confirms the above findings. IMPRESSION: 1. No evidence of pulmonary embolus. 2. Diffuse interstitial prominence, with hazy  bilateral airspace opacities. This is concerning for pulmonary edema. Infection is considered less likely. 3. Diffuse coronary artery calcifications noted. Mild calcification at the mitral valve. 4. Chronic compression deformity of vertebral body T10. Electronically Signed   By: Roanna Raider M.D.   On: 05/21/2017 23:45    EKG: Independently reviewed. Sinus tachycardia (rate 115), LVH with repolarization abnormality.   Assessment/Plan  1. Acute hypoxic respiratory failure  - Pt presents with cough and dyspnea, worsening over the past day  - Found to be saturating mid-80's on rm air while at rest, with new leukocytosis and diffuse hazy airspace opacities bilaterally on CTA chest  - Given the new cough and leukocytosis, atypical PNA is suspected - Also concern for acute on chronic diastolic CHF as below  - Continue supplemental O2 and treat possible acute CHF and PNA as below    2. Acute on chronic diastolic CHF  - Pt presents with chest discomfort and dyspnea, noted to have diffuse hazy airspace opacities on CTA, possibly reflecting pulmonary edema  - Had echo earlier this month with grade 1 diastolic dysfunction, mild MR and TR, preserved EF  - Plan to give 20 mg IV Lasix x1 now, SLIV, follow daily wts and I/O's,  continue losartan and Lopressor    3. CAP  - Pt presents with cough and dyspnea, found to have new leukocytosis and hypoxia  - Check cultures, trend procalcitonin, start empiric Levaquin  - Continue supplemntal O2 prn   4. Hypertension  - BP is at goal  - Hold HCTZ in light of hyponatremia, continue Lopressor and losartan    5. Hyponatremia  - Serum sodium is 133 on admission  - Hold HCTZ, diuresing, repeat chem panel in am    6. Chest pain  - Just had normal stress test 2 days ago - Initial troponin undetectable   - Suspected secondary to #2 and/or #3 above  - She will be monitored on telemetry, will obtain serial troponin measurements, and continue Lopressor, ASA,  Lipitor, losartan    DVT prophylaxis: sq Lovenox Code Status: Full  Family Communication: Discussed with patient Disposition Plan: Admit to telemetry Consults called: None Admission status: Inpatient    Briscoe Deutscher, MD [facility] Pager [phone]  If 7PM-7AM, please contact night-coverage [email or web address] Password [facility]  05/22/2017, 12:25 AM

## 2017-05-22 NOTE — ED Notes (Signed)
Please call RN for report at 0110. Number [phone]

## 2017-05-22 NOTE — Progress Notes (Signed)
  PROGRESS NOTE  Patient admitted earlier this morning. See H&P. Hannah Colon is a 61 yo female with medical history significant for hypertension, seasonal allergy, and recent diagnosis of diastolic CHF, now presenting to the emergency department for evaluation of dyspnea and chest pain. She was recently discharged from the hospital with similar symptoms, had negative outpatient stress test. Over the past week, she has been weak, had some shortness of breath, dry cough, as well as central chest pain with radiation to her back. In the emergency department, CTA chest was obtained, it was negative for pulmonary embolism, but notable for diffuse interstitial prominence with hazy bilateral airspace opacities concerning for pulmonary edema, or possibly infection although less likely. She had a leukocytosis of 18.7. She was started on nasal cannula O2 for acute hypoxemic respiratory failure, started on Lasix for acute on chronic diastolic heart failure, as well as empiric Levaquin for community-acquired pneumonia.  Currently without any chest pain  Blood cultures pending Unable to provide sputum for culture as cough is dry Obtain MRSA PCR as patient recently hospitalized. If positive, will start vanco to cover HCAP. As leukocytosis has improved, will continue levaquin only for now.  Continue lasix 20mg  IV daily, I/Os, daily weight  Also on BB, ACE inhibitor, aspirin, lipitor.     Noralee Stain, DO [facility] [email or web address] Password [email or web address] 05/22/2017, 12:20 PM

## 2017-05-23 DIAGNOSIS — J9601 Acute respiratory failure with hypoxia: Secondary | ICD-10-CM | POA: Diagnosis not present

## 2017-05-23 LAB — BASIC METABOLIC PANEL
ANION GAP: 8 (ref 5–15)
BUN: 19 mg/dL (ref 6–20)
CALCIUM: 9.3 mg/dL (ref 8.9–10.3)
CHLORIDE: 101 mmol/L (ref 101–111)
CO2: 26 mmol/L (ref 22–32)
Creatinine, Ser: 0.86 mg/dL (ref 0.44–1.00)
GFR calc non Af Amer: >60 mL/min (ref >60)
Glucose, Bld: 107 mg/dL — ABNORMAL HIGH (ref 65–99)
Potassium: 4.4 mmol/L (ref 3.5–5.1)
SODIUM: 135 mmol/L (ref 135–145)

## 2017-05-23 LAB — CBC WITH DIFFERENTIAL/PLATELET
BASOS PCT: 1 %
Basophils Absolute: 0.1 10*3/uL (ref 0.0–0.1)
Eosinophils Absolute: 0.4 10*3/uL (ref 0.0–0.7)
Eosinophils Relative: 5 %
HEMATOCRIT: 31.4 % — AB (ref 36.0–46.0)
HEMOGLOBIN: 10.6 g/dL — AB (ref 12.0–15.0)
Lymphocytes Relative: 20 %
Lymphs Abs: 1.5 10*3/uL (ref 0.7–4.0)
MCH: 29 pg (ref 26.0–34.0)
MCHC: 33.8 g/dL (ref 30.0–36.0)
MCV: 85.8 fL (ref 78.0–100.0)
MONOS PCT: 12 %
Monocytes Absolute: 0.9 10*3/uL (ref 0.1–1.0)
NEUTROS ABS: 4.6 10*3/uL (ref 1.7–7.7)
NEUTROS PCT: 62 %
Platelets: 388 10*3/uL (ref 150–400)
RBC: 3.66 MIL/uL — AB (ref 3.87–5.11)
RDW: 13.6 % (ref 11.5–15.5)
WBC: 7.5 10*3/uL (ref 4.0–10.5)

## 2017-05-23 MED ORDER — FUROSEMIDE 20 MG PO TABS: 20.0000 mg | ORAL_TABLET | Freq: Every day | ORAL | 0 refills | Status: DC

## 2017-05-23 MED ORDER — LEVOFLOXACIN 750 MG PO TABS: 750.0000 mg | ORAL_TABLET | Freq: Every day | ORAL | 0 refills | Status: AC

## 2017-05-23 NOTE — Discharge Summary (Addendum)
Physician Discharge Summary  Hannah Colon ZOX:[id number] DOB: 1956/09/04 DOA: 05/21/2017  PCP: Burnis Medin, PA-C  Admit date: 05/21/2017 Discharge date: 05/23/2017  Admitted From: Home Disposition:  Home  Recommendations for Outpatient Follow-up:  1. Follow up with PCP in 1 week. Please obtain BMP in 1 week to check potassium level 2. Follow up with Cardiology as scheduled with Dr. Duke Salvia on 06/10/2017 3. Please follow up on the following pending results: Final blood culture results  Home Health: No  Equipment/Devices: None   Discharge Condition: Stable CODE STATUS: Full  Diet recommendation: Heart healthy   Brief/Interim Summary: Hannah Colon is a 61 yo female with medical history significant for hypertension, seasonal allergy, and recent diagnosis of diastolic CHF, now presenting to the emergency department for evaluation of dyspnea and chest pain. She was recently discharged from the hospital with similar symptoms, had negative outpatient stress test. Over the past week, she has been weak, had some shortness of breath, dry cough, as well as central chest pain with radiation to her back. In the emergency department, CTA chest was obtained, it was negative for pulmonary embolism, but notable for diffuse interstitial prominence with hazy bilateral airspace opacities concerning for pulmonary edema, or possibly infection although less likely. She had a leukocytosis of 18.7. She was started on nasal cannula O2 for acute hypoxemic respiratory failure, started on Lasix for acute on chronic diastolic heart failure, as well as empiric Levaquin for community-acquired pneumonia. She continued to improve and was weaned off of supplemental O2. She was discharged home in stable condition.   Discharge Diagnoses:  Principal Problem:   Acute respiratory failure with hypoxia (HCC) Active Problems:   Chest pain on exertion   Acute diastolic heart failure (HCC)   CAP (community acquired  pneumonia)  Acute hypoxemic respiratory failure -Weaned off nasal cannula O2. On room air this morning  Acute on chronic diastolic heart failure -Echocardiogram 05/13/2017 with EF 65-70%, grade 1 diastolic dysfunction -Improved with IV lasix. We'll discharge home with oral Lasix and stop HCTZ  -Continue heart healthy diet, reviewed low-sodium and fluid restriction diet  Possible HCAP -Recent hospitalization on 05/12/2017  -MRSA PCR negative, strep pneumo negative, HIV NR  -Blood culture no growth to date  -Discharge on Levaquin  HTN -Continue lopressor, losartan   CAD -Continue lipitor    Discharge Instructions  Discharge Instructions    (HEART FAILURE PATIENTS) Call MD:  Anytime you have any of the following symptoms: 1) 3 pound weight gain in 24 hours or 5 pounds in 1 week 2) shortness of breath, with or without a dry hacking cough 3) swelling in the hands, feet or stomach 4) if you have to sleep on extra pillows at night in order to breathe.    Complete by:  As directed    Call MD for:  difficulty breathing, headache or visual disturbances    Complete by:  As directed    Call MD for:  extreme fatigue    Complete by:  As directed    Call MD for:  hives    Complete by:  As directed    Call MD for:  persistant dizziness or light-headedness    Complete by:  As directed    Call MD for:  persistant nausea and vomiting    Complete by:  As directed    Call MD for:  severe uncontrolled pain    Complete by:  As directed    Call MD for:  temperature >100.4  Complete by:  As directed    Diet - low sodium heart healthy    Complete by:  As directed    Discharge instructions    Complete by:  As directed    You were cared for by a hospitalist during your hospital stay. If you have any questions about your discharge medications or the care you received while you were in the hospital after you are discharged, you can call the unit and asked to speak with the hospitalist on call if the  hospitalist that took care of you is not available. Once you are discharged, your primary care physician will handle any further medical issues. Please note that NO REFILLS for any discharge medications will be authorized once you are discharged, as it is imperative that you return to your primary care physician (or establish a relationship with a primary care physician if you do not have one) for your aftercare needs so that they can reassess your need for medications and monitor your lab values.   Increase activity slowly    Complete by:  As directed      Allergies as of 05/23/2017      Reactions   Penicillins Hives, Rash   Has patient had a PCN reaction causing immediate rash, facial/tongue/throat swelling, SOB or lightheadedness with hypotension: No Has patient had a PCN reaction causing severe rash involving mucus membranes or skin necrosis: No Has patient had a PCN reaction that required hospitalization: No Has patient had a PCN reaction occurring within the last 10 years: Yes If all of the above answers are "NO", then may proceed with Cephalosporin use.      Medication List    STOP taking these medications   hydrochlorothiazide 25 MG tablet Commonly known as:  HYDRODIURIL     TAKE these medications   aspirin 81 MG EC tablet Take 1 tablet (81 mg total) by mouth daily.   atorvastatin 20 MG tablet Commonly known as:  LIPITOR Take 20 mg by mouth daily.   azelastine 0.1 % nasal spray Commonly known as:  ASTELIN Place 1 spray into both nostrils 2 (two) times daily.   fluticasone 50 MCG/ACT nasal spray Commonly known as:  FLONASE Place 1 spray into both nostrils daily.   furosemide 20 MG tablet Commonly known as:  LASIX Take 1 tablet (20 mg total) by mouth daily.   levofloxacin 750 MG tablet Commonly known as:  LEVAQUIN Take 1 tablet (750 mg total) by mouth daily.   losartan 100 MG tablet Commonly known as:  COZAAR Take 50 mg by mouth daily.   metoprolol tartrate 25  MG tablet Commonly known as:  LOPRESSOR Take 1 tablet (25 mg total) by mouth 2 (two) times daily.   multivitamin with minerals Tabs tablet Take 1 tablet by mouth daily.      Follow-up Information    [place], [place], [place]. Schedule an appointment as soon as possible for a visit in 1 week(s).   Specialty:  Family Medicine Contact information: [phone] [place] [address] [place] [address] [phone]        Chilton Si, MD. Go on 06/10/2017.   Specialty:  Cardiology Contact information: [address] [place] [address] [place] [address] [phone]          Allergies  Allergen Reactions  . Penicillins Hives and Rash    Has patient had a PCN reaction causing immediate rash, facial/tongue/throat swelling, SOB or lightheadedness with hypotension: No Has patient had a PCN reaction causing  severe rash involving mucus membranes or skin necrosis: No Has patient had a PCN reaction that required hospitalization: No Has patient had a PCN reaction occurring within the last 10 years: Yes If all of the above answers are "NO", then may proceed with Cephalosporin use.     Consultations:  None   Procedures/Studies: Dg Chest 2 View  Result Date: 05/21/2017 CLINICAL DATA:  Shortness of breath and chest pain EXAM: CHEST  2 VIEW COMPARISON:  None. FINDINGS: The lungs are clear. The heart size and pulmonary vascularity are normal. No adenopathy. There is anterior wedging of the T10 vertebral body, age uncertain. There is midthoracic dextroscoliosis. IMPRESSION: No edema or consolidation. Heart size is within normal limits. There is age uncertain anterior wedging of the T10 vertebral body. Electronically Signed   By: Bretta Bang III M.D.   On: 05/21/2017 20:04   Dg Chest 2 View  Result Date: 05/12/2017 CLINICAL DATA:  Chest pain and shortness of breath for 1 week. EXAM: CHEST  2 VIEW COMPARISON:  None. FINDINGS: Upper limits normal heart size noted. Mild interstitial  prominence is present. There is no evidence of focal airspace disease, pulmonary edema, suspicious pulmonary nodule/mass, pleural effusion, or pneumothorax. No acute bony abnormalities are identified. IMPRESSION: Mild nonspecific interstitial prominence of uncertain chronicity. Upper limits normal heart size. Electronically Signed   By: Harmon Pier M.D.   On: 05/12/2017 18:03   Ct Angio Chest Pe W And/or Wo Contrast  Result Date: 05/21/2017 CLINICAL DATA:  Acute onset of mid chest pain and upper back pain. Shortness of breath. Initial encounter. EXAM: CT ANGIOGRAPHY CHEST WITH CONTRAST TECHNIQUE: Multidetector CT imaging of the chest was performed using the standard protocol during bolus administration of intravenous contrast. Multiplanar CT image reconstructions and MIPs were obtained to evaluate the vascular anatomy. CONTRAST:  65 mL of Isovue 370 IV contrast COMPARISON:  Chest radiograph performed earlier today at 7:53 p.m. FINDINGS: Cardiovascular:  There is no evidence of pulmonary embolus. The heart is normal in size. Diffuse coronary artery calcifications are seen. Mild calcification is noted at the mitral valve. The thoracic aorta is grossly unremarkable. The great vessels are within normal limits. Mediastinum/Nodes: Visualized mediastinal nodes remain borderline normal in size. No pericardial effusion is identified. The thyroid gland is grossly unremarkable. No axillary lymphadenopathy is appreciated. Lungs/Pleura: Diffuse interstitial prominence is noted, with hazy bilateral airspace opacities. This is concerning for pulmonary edema. Infection is considered less likely. No pleural effusion or pneumothorax is seen. No masses are identified. Upper Abdomen: The visualized portions of the liver and spleen are unremarkable. The visualized portions of the pancreas and left adrenal gland are within normal limits. Musculoskeletal: No acute osseous abnormalities are identified. There is chronic compression  deformity of vertebral body T10. The visualized musculature is unremarkable in appearance. Review of the MIP images confirms the above findings. IMPRESSION: 1. No evidence of pulmonary embolus. 2. Diffuse interstitial prominence, with hazy bilateral airspace opacities. This is concerning for pulmonary edema. Infection is considered less likely. 3. Diffuse coronary artery calcifications noted. Mild calcification at the mitral valve. 4. Chronic compression deformity of vertebral body T10. Electronically Signed   By: Roanna Raider M.D.   On: 05/21/2017 23:45    Discharge Exam: Vitals:   05/22/17 2018 05/23/17 0455  BP: (!) 115/57 113/61  Pulse: 89 79  Resp: 17 18  Temp: 98.1 F (36.7 C) 97.8 F (36.6 C)  SpO2: 91% 92%   Vitals:   05/22/17 1411 05/22/17 2018 05/23/17  0455 05/23/17 0456  BP: 128/65 (!) 115/57 113/61   Pulse: 88 89 79   Resp: 16 17 18    Temp: (!) 97.4 F (36.3 C) 98.1 F (36.7 C) 97.8 F (36.6 C)   TempSrc: Oral Oral Oral   SpO2: 95% 91% 92%   Weight:    62 kg (136 lb 11 oz)  Height:        General: Pt is alert, awake, not in acute distress Cardiovascular: RRR, S1/S2 +, no rubs, no gallops, +murmur  Respiratory: CTA bilaterally, no wheezing, no rhonchi Abdominal: Soft, NT, ND, bowel sounds + Extremities: no edema, no cyanosis    The results of significant diagnostics from this hospitalization (including imaging, microbiology, ancillary and laboratory) are listed below for reference.     Microbiology: Recent Results (from the past 240 hour(s))  Culture, blood (routine x 2) Call MD if unable to obtain prior to antibiotics being given     Status: None (Preliminary result)   Collection Time: 05/22/17  2:08 AM  Result Value Ref Range Status   Specimen Description BLOOD RIGHT ARM  Final   Special Requests   Final    BOTTLES DRAWN AEROBIC AND ANAEROBIC Blood Culture adequate volume   Culture   Final    NO GROWTH 1 DAY Performed at [facility], [address] [place], [place] [address]    Report Status PENDING  Incomplete  Culture, blood (routine x 2) Call MD if unable to obtain prior to antibiotics being given     Status: None (Preliminary result)   Collection Time: 05/22/17  2:15 AM  Result Value Ref Range Status   Specimen Description BLOOD BLOOD LEFT HAND  Final   Special Requests   Final    BOTTLES DRAWN AEROBIC ONLY Blood Culture adequate volume   Culture   Final    NO GROWTH 1 DAY Performed at [facility], [address], [place], [place] [address]    Report Status PENDING  Incomplete  MRSA PCR Screening     Status: None   Collection Time: 05/22/17 12:26 PM  Result Value Ref Range Status   MRSA by PCR NEGATIVE NEGATIVE Final    Comment:        The GeneXpert MRSA Assay (FDA approved for NASAL specimens only), is one component of a comprehensive MRSA colonization surveillance program. It is not intended to diagnose MRSA infection nor to guide or monitor treatment for MRSA infections.      Labs: BNP (last 3 results)  Recent Labs  05/12/17 1734 05/22/17 0033  BNP 92.3 78.1   Basic Metabolic Panel:  Recent Labs Lab 05/21/17 2006 05/23/17 0437  NA 133* 135  K 3.7 4.4  CL 98* 101  CO2 24 26  GLUCOSE 121* 107*  BUN 15 19  CREATININE 0.76 0.86  CALCIUM 9.3 9.3   Liver Function Tests: No results for input(s): AST, ALT, ALKPHOS, BILITOT, PROT, ALBUMIN in the last 168 hours. No results for input(s): LIPASE, AMYLASE in the last 168 hours. No results for input(s): AMMONIA in the last 168 hours. CBC:  Recent Labs Lab 05/21/17 2006 05/22/17 0528 05/23/17 0437  WBC 18.7* 10.3 7.5  NEUTROABS  --  8.2* 4.6  HGB 10.4* 10.5* 10.6*  HCT 30.9* 31.3* 31.4*  MCV 86.6 85.3 85.8  PLT 399 392 388   Cardiac Enzymes:  Recent Labs Lab 05/22/17 0528 05/22/17 1116  TROPONINI <0.03 <0.03   BNP: Invalid input(s): POCBNP CBG: No results  for input(s): GLUCAP in the last 168 hours. D-Dimer No results for  input(s): DDIMER in the last 72 hours. Hgb A1c No results for input(s): HGBA1C in the last 72 hours. Lipid Profile No results for input(s): CHOL, HDL, LDLCALC, TRIG, CHOLHDL, LDLDIRECT in the last 72 hours. Thyroid function studies No results for input(s): TSH, T4TOTAL, T3FREE, THYROIDAB in the last 72 hours.  Invalid input(s): FREET3 Anemia work up No results for input(s): VITAMINB12, FOLATE, FERRITIN, TIBC, IRON, RETICCTPCT in the last 72 hours. Urinalysis No results found for: COLORURINE, APPEARANCEUR, LABSPEC, PHURINE, GLUCOSEU, HGBUR, BILIRUBINUR, KETONESUR, PROTEINUR, UROBILINOGEN, NITRITE, LEUKOCYTESUR Sepsis Labs Invalid input(s): PROCALCITONIN,  WBC,  LACTICIDVEN Microbiology Recent Results (from the past 240 hour(s))  Culture, blood (routine x 2) Call MD if unable to obtain prior to antibiotics being given     Status: None (Preliminary result)   Collection Time: 05/22/17  2:08 AM  Result Value Ref Range Status   Specimen Description BLOOD RIGHT ARM  Final   Special Requests   Final    BOTTLES DRAWN AEROBIC AND ANAEROBIC Blood Culture adequate volume   Culture   Final    NO GROWTH 1 DAY Performed at [facility], [address], [place], [place] [address]    Report Status PENDING  Incomplete  Culture, blood (routine x 2) Call MD if unable to obtain prior to antibiotics being given     Status: None (Preliminary result)   Collection Time: 05/22/17  2:15 AM  Result Value Ref Range Status   Specimen Description BLOOD BLOOD LEFT HAND  Final   Special Requests   Final    BOTTLES DRAWN AEROBIC ONLY Blood Culture adequate volume   Culture   Final    NO GROWTH 1 DAY Performed at [facility], [address] [place], [place] [address]    Report Status PENDING  Incomplete  MRSA PCR Screening     Status: None   Collection Time: 05/22/17 12:26 PM  Result Value Ref Range Status   MRSA by PCR NEGATIVE NEGATIVE Final    Comment:        The GeneXpert MRSA Assay  (FDA approved for NASAL specimens only), is one component of a comprehensive MRSA colonization surveillance program. It is not intended to diagnose MRSA infection nor to guide or monitor treatment for MRSA infections.      Time coordinating discharge: 40 minutes  SIGNED:  Noralee Stain, DO [facility] Pager [phone]  If 7PM-7AM, please contact night-coverage [email or web address] Password [facility] 05/23/2017, 11:33 AM

## 2017-05-23 NOTE — Discharge Instructions (Signed)
DASH Eating Plan DASH stands for "Dietary Approaches to Stop Hypertension." The DASH eating plan is a healthy eating plan that has been shown to reduce high blood pressure (hypertension). It may also reduce your risk for type 2 diabetes, heart disease, and stroke. The DASH eating plan may also help with weight loss. What are tips for following this plan? General guidelines  Avoid eating more than 2,300 mg (milligrams) of salt (sodium) a day. If you have hypertension, you may need to reduce your sodium intake to 1,500 mg a day.  Limit alcohol intake to no more than 1 drink a day for nonpregnant women and 2 drinks a day for men. One drink equals 12 oz of beer, 5 oz of wine, or 1 oz of hard liquor.  Work with your health care provider to maintain a healthy body weight or to lose weight. Ask what an ideal weight is for you.  Get at least 30 minutes of exercise that causes your heart to beat faster (aerobic exercise) most days of the week. Activities may include walking, swimming, or biking.  Work with your health care provider or diet and nutrition specialist (dietitian) to adjust your eating plan to your individual calorie needs. Reading food labels  Check food labels for the amount of sodium per serving. Choose foods with less than 5 percent of the Daily Value of sodium. Generally, foods with less than 300 mg of sodium per serving fit into this eating plan.  To find whole grains, look for the word "whole" as the first word in the ingredient list. Shopping  Buy products labeled as "low-sodium" or "no salt added."  Buy fresh foods. Avoid canned foods and premade or frozen meals. Cooking  Avoid adding salt when cooking. Use salt-free seasonings or herbs instead of table salt or sea salt. Check with your health care provider or pharmacist before using salt substitutes.  Do not fry foods. Cook foods using healthy methods such as baking, boiling, grilling, and broiling instead.  Cook with  heart-healthy oils, such as olive, canola, soybean, or sunflower oil. Meal planning   Eat a balanced diet that includes: ? 5 or more servings of fruits and vegetables each day. At each meal, try to fill half of your plate with fruits and vegetables. ? Up to 6-8 servings of whole grains each day. ? Less than 6 oz of lean meat, poultry, or fish each day. A 3-oz serving of meat is about the same size as a deck of cards. One egg equals 1 oz. ? 2 servings of low-fat dairy each day. ? A serving of nuts, seeds, or beans 5 times each week. ? Heart-healthy fats. Healthy fats called Omega-3 fatty acids are found in foods such as flaxseeds and coldwater fish, like sardines, salmon, and mackerel.  Limit how much you eat of the following: ? Canned or prepackaged foods. ? Food that is high in trans fat, such as fried foods. ? Food that is high in saturated fat, such as fatty meat. ? Sweets, desserts, sugary drinks, and other foods with added sugar. ? Full-fat dairy products.  Do not salt foods before eating.  Try to eat at least 2 vegetarian meals each week.  Eat more home-cooked food and less restaurant, buffet, and fast food.  When eating at a restaurant, ask that your food be prepared with less salt or no salt, if possible. What foods are recommended? The items listed may not be a complete list. Talk with your dietitian about what   dietary choices are best for you. Grains Whole-grain or whole-wheat bread. Whole-grain or whole-wheat pasta. Brown rice. Oatmeal. Quinoa. Bulgur. Whole-grain and low-sodium cereals. Pita bread. Low-fat, low-sodium crackers. Whole-wheat flour tortillas. Vegetables Fresh or frozen vegetables (raw, steamed, roasted, or grilled). Low-sodium or reduced-sodium tomato and vegetable juice. Low-sodium or reduced-sodium tomato sauce and tomato paste. Low-sodium or reduced-sodium canned vegetables. Fruits All fresh, dried, or frozen fruit. Canned fruit in natural juice (without  added sugar). Meat and other protein foods Skinless chicken or turkey. Ground chicken or turkey. Pork with fat trimmed off. Fish and seafood. Egg whites. Dried beans, peas, or lentils. Unsalted nuts, nut butters, and seeds. Unsalted canned beans. Lean cuts of beef with fat trimmed off. Low-sodium, lean deli meat. Dairy Low-fat (1%) or fat-free (skim) milk. Fat-free, low-fat, or reduced-fat cheeses. Nonfat, low-sodium ricotta or cottage cheese. Low-fat or nonfat yogurt. Low-fat, low-sodium cheese. Fats and oils Soft margarine without trans fats. Vegetable oil. Low-fat, reduced-fat, or light mayonnaise and salad dressings (reduced-sodium). Canola, safflower, olive, soybean, and sunflower oils. Avocado. Seasoning and other foods Herbs. Spices. Seasoning mixes without salt. Unsalted popcorn and pretzels. Fat-free sweets. What foods are not recommended? The items listed may not be a complete list. Talk with your dietitian about what dietary choices are best for you. Grains Baked goods made with fat, such as croissants, muffins, or some breads. Dry pasta or rice meal packs. Vegetables Creamed or fried vegetables. Vegetables in a cheese sauce. Regular canned vegetables (not low-sodium or reduced-sodium). Regular canned tomato sauce and paste (not low-sodium or reduced-sodium). Regular tomato and vegetable juice (not low-sodium or reduced-sodium). Pickles. Olives. Fruits Canned fruit in a light or heavy syrup. Fried fruit. Fruit in cream or butter sauce. Meat and other protein foods Fatty cuts of meat. Ribs. Fried meat. Bacon. Sausage. Bologna and other processed lunch meats. Salami. Fatback. Hotdogs. Bratwurst. Salted nuts and seeds. Canned beans with added salt. Canned or smoked fish. Whole eggs or egg yolks. Chicken or turkey with skin. Dairy Whole or 2% milk, cream, and half-and-half. Whole or full-fat cream cheese. Whole-fat or sweetened yogurt. Full-fat cheese. Nondairy creamers. Whipped toppings.  Processed cheese and cheese spreads. Fats and oils Butter. Stick margarine. Lard. Shortening. Ghee. Bacon fat. Tropical oils, such as coconut, palm kernel, or palm oil. Seasoning and other foods Salted popcorn and pretzels. Onion salt, garlic salt, seasoned salt, table salt, and sea salt. Worcestershire sauce. Tartar sauce. Barbecue sauce. Teriyaki sauce. Soy sauce, including reduced-sodium. Steak sauce. Canned and packaged gravies. Fish sauce. Oyster sauce. Cocktail sauce. Horseradish that you find on the shelf. Ketchup. Mustard. Meat flavorings and tenderizers. Bouillon cubes. Hot sauce and Tabasco sauce. Premade or packaged marinades. Premade or packaged taco seasonings. Relishes. Regular salad dressings. Where to find more information:  National Heart, Lung, and Blood Institute: www.nhlbi.nih.gov  American Heart Association: www.heart.org Summary  The DASH eating plan is a healthy eating plan that has been shown to reduce high blood pressure (hypertension). It may also reduce your risk for type 2 diabetes, heart disease, and stroke.  With the DASH eating plan, you should limit salt (sodium) intake to 2,300 mg a day. If you have hypertension, you may need to reduce your sodium intake to 1,500 mg a day.  When on the DASH eating plan, aim to eat more fresh fruits and vegetables, whole grains, lean proteins, low-fat dairy, and heart-healthy fats.  Work with your health care provider or diet and nutrition specialist (dietitian) to adjust your eating plan to your individual   calorie needs. This information is not intended to replace advice given to you by your health care provider. Make sure you discuss any questions you have with your health care provider. Document Released: 09/09/2011 Document Revised: 09/13/2016 Document Reviewed: 09/13/2016 Elsevier Interactive Patient Education  2017 [employer]  

## 2017-05-23 NOTE — Evaluation (Signed)
Physical Therapy Evaluation Patient Details Name: Hannah Colon MRN: [id number] DOB: Nov 26, 1955 Today's Date: 05/23/2017   History of Present Illness   61 y.o. female with medical history significant for hypertension, seasonal allergy, and recent diagnosis of diastolic CHF, now presenting to the emergency department for evaluation of dyspnea and chest pain.  Clinical Impression  Pt is independent with mobility, she ambulated 200' without an assistive device, no loss of balance, SaO2 94-98% on RA. From PT standpoint, she is ready to DC home. PT signing off as she has no PT needs.     Follow Up Recommendations No PT follow up    Equipment Recommendations  None recommended by PT    Recommendations for Other Services       Precautions / Restrictions Precautions Precautions: None Precaution Comments: no falls in past 1 year Restrictions Weight Bearing Restrictions: No      Mobility  Bed Mobility Overal bed mobility: Independent                Transfers Overall transfer level: Independent                  Ambulation/Gait Ambulation/Gait assistance: Independent Ambulation Distance (Feet): 200 Feet Assistive device: None Gait Pattern/deviations: WFL(Within Functional Limits)   Gait velocity interpretation: at or above normal speed for age/gender General Gait Details: steady, no LOB, SaO2 94-98% on RA, no dyspnea  Stairs            Wheelchair Mobility    Modified Rankin (Stroke Patients Only)       Balance Overall balance assessment: Independent                                           Pertinent Vitals/Pain Pain Assessment: No/denies pain    Home Living Family/patient expects to be discharged to:: Private residence Living Arrangements: Spouse/significant other Available Help at Discharge: Family;Available PRN/intermittently                  Prior Function Level of Independence: Independent         Comments:  works in accounts payable     Higher education careers adviser        Extremity/Trunk Assessment   Upper Extremity Assessment Upper Extremity Assessment: Overall WFL for tasks assessed    Lower Extremity Assessment Lower Extremity Assessment: Overall WFL for tasks assessed    Cervical / Trunk Assessment Cervical / Trunk Assessment: Normal  Communication   Communication: No difficulties  Cognition Arousal/Alertness: Awake/alert Behavior During Therapy: WFL for tasks assessed/performed Overall Cognitive Status: Within Functional Limits for tasks assessed                                        General Comments      Exercises     Assessment/Plan    PT Assessment Patent does not need any further PT services  PT Problem List         PT Treatment Interventions      PT Goals (Current goals can be found in the Care Plan section)  Acute Rehab PT Goals Patient Stated Goal: return to work PT Goal Formulation: All assessment and education complete, DC therapy    Frequency     Barriers to discharge        Co-evaluation  AM-PAC PT "6 Clicks" Daily Activity  Outcome Measure Difficulty turning over in bed (including adjusting bedclothes, sheets and blankets)?: None Difficulty moving from lying on back to sitting on the side of the bed? : None Difficulty sitting down on and standing up from a chair with arms (e.g., wheelchair, bedside commode, etc,.)?: None Help needed moving to and from a bed to chair (including a wheelchair)?: None Help needed walking in hospital room?: None Help needed climbing 3-5 steps with a railing? : None 6 Click Score: 24    End of Session Equipment Utilized During Treatment: Gait belt Activity Tolerance: Patient tolerated treatment well Patient left: in bed;with call bell/phone within reach;with family/visitor present Nurse Communication: Mobility status      Time: 8676-1950 PT Time Calculation (min) (ACUTE ONLY): 11  min   Charges:   PT Evaluation $PT Eval Low Complexity: 1 Low     PT G CodesTamala Ser 05/23/2017, 10:31 AM [phone]

## 2017-05-25 NOTE — Progress Notes (Signed)
   05/23/17 1030  Balance  Overall balance assessment Independent  PT - End of Session  Equipment Utilized During Treatment Gait belt  Activity Tolerance Patient tolerated treatment well  Patient left in bed;with call bell/phone within reach;with family/visitor present  Nurse Communication Mobility status  PT Assessment  PT Recommendation/Assessment Patent does not need any further PT services  No Skilled PT Patient is independent with all acitivity/mobility  AM-PAC PT "6 Clicks" Daily Activity Outcome Measure  Difficulty turning over in bed (including adjusting bedclothes, sheets and blankets)? 4  Difficulty moving from lying on back to sitting on the side of the bed?  4  Difficulty sitting down on and standing up from a chair with arms (e.g., wheelchair, bedside commode, etc,.)? 4  Help needed moving to and from a bed to chair (including a wheelchair)? 4  Help needed walking in hospital room? 4  Help needed climbing 3-5 steps with a railing?  4  6 Click Score 24  Mobility G Code  CH  PT Recommendation  Follow Up Recommendations No PT follow up  PT equipment None recommended by PT  Acute Rehab PT Goals  Patient Stated Goal return to work  PT Goal Formulation All assessment and education complete, DC therapy  PT Time Calculation  PT Start Time (ACUTE ONLY) 1016  PT Stop Time (ACUTE ONLY) 1027  PT Time Calculation (min) (ACUTE ONLY) 11 min  PT G-Codes **NOT FOR INPATIENT CLASS**  Functional Assessment Tool Used AM-PAC 6 Clicks Basic Mobility;Clinical judgement  Functional Limitation Mobility: Walking and moving around  Mobility: Walking and Moving Around Current Status (O8786) CH  Mobility: Walking and Moving Around Goal Status (V6720) CH  PT General Charges  $$ ACUTE PT VISIT 1 Visit  PT Evaluation  $PT Eval Low Complexity 1 Low

## 2017-05-27 LAB — CULTURE, BLOOD (ROUTINE X 2)
CULTURE: NO GROWTH
CULTURE: NO GROWTH
SPECIAL REQUESTS: ADEQUATE
Special Requests: ADEQUATE

## 2017-06-10 ENCOUNTER — Encounter: Payer: Self-pay | Admitting: Cardiovascular Disease

## 2017-06-10 ENCOUNTER — Ambulatory Visit (INDEPENDENT_AMBULATORY_CARE_PROVIDER_SITE_OTHER): Payer: 59 | Admitting: Cardiovascular Disease

## 2017-06-10 VITALS — BP 129/70 | HR 55 | Ht 60.0 in | Wt 136.8 lb

## 2017-06-10 DIAGNOSIS — I5032 Chronic diastolic (congestive) heart failure: Secondary | ICD-10-CM

## 2017-06-10 DIAGNOSIS — E7800 Pure hypercholesterolemia, unspecified: Secondary | ICD-10-CM

## 2017-06-10 DIAGNOSIS — I110 Hypertensive heart disease with heart failure: Secondary | ICD-10-CM

## 2017-06-10 DIAGNOSIS — Z5181 Encounter for therapeutic drug level monitoring: Secondary | ICD-10-CM

## 2017-06-10 LAB — BASIC METABOLIC PANEL
BUN / CREAT RATIO: 22 (ref 12–28)
BUN: 16 mg/dL (ref 8–27)
CO2: 23 mmol/L (ref 20–29)
CREATININE: 0.73 mg/dL (ref 0.57–1.00)
Calcium: 9.8 mg/dL (ref 8.7–10.3)
Chloride: 102 mmol/L (ref 96–106)
GFR, EST AFRICAN AMERICAN: 104 mL/min/{1.73_m2} (ref >59)
GFR, EST NON AFRICAN AMERICAN: 90 mL/min/{1.73_m2} (ref >59)
GLUCOSE: 84 mg/dL (ref 65–99)
Potassium: 4.4 mmol/L (ref 3.5–5.2)
SODIUM: 139 mmol/L (ref 134–144)

## 2017-06-10 NOTE — Progress Notes (Signed)
Cardiology Office Note   Date:  06/10/2017   ID:  Hannah Colon, DOB Apr 07, 1956, MRN [id number]  PCP:  Burnis MedinFulbright, Virginia E, PA-C  Cardiologist:   Chilton Siiffany McMinn, MD   Chief Complaint  Patient presents with  . Hospitalization Follow-up  . Dizziness    randomly,     History of Present Illness: Hannah Colon is a 61 y.o. female with chronic diastolic heart failure, hypertension, hyperlipidemia, and left ventricular hypertrophy who presents for follow-up. She was seen in the hospital 05/2017 With chest pain and shortness of breath. She hadn't noted any lower extremity edema but did report orthopnea. Cardiac enzymes M.D.-dimer were negative. EKG was not concerning for ischemia. She had an echocardiogram that revealed LVEF 65-70% with grade 1 diastolic dysfunction and moderate LVH.  She had a very mild outflow tract gradient. She was started on metoprolol and continued with hydrochlorothiazide. She Again presented to the ED 05/22/17 with chest pain that occurred while sitting in a recliner.  She had a Lexiscan Myoview that was negative for ischemia.  Her chest discomfort was thought to be due to gastroesophageal reflux disease.  Her WBC was 18 and she was also thought to have an atypical pneumonia. She was switched from HCTZ to Lasix.  Since leaving the hospital she has been feeling better.  She has been taking care of her husband who had shoulder surgery.  Her breathing has been much better and she denies any lower extremity edema, orthopnea, or PND. Her weight has been stable and she has been monitoring the salt in her diet. She hasn't started back exercising much lately because he's been busy taking care of her husband. She did start back at work last week and notes that she's been more tired than usual.   Past Medical History:  Diagnosis Date  . Environmental and seasonal allergies   . High cholesterol   . Hypertension   . Hypertensive heart disease 05/13/2017    Past Surgical  History:  Procedure Laterality Date  . ABDOMINAL HYSTERECTOMY    . SHOULDER ARTHROSCOPY Right      Current Outpatient Prescriptions  Medication Sig Dispense Refill  . aspirin EC 81 MG EC tablet Take 1 tablet (81 mg total) by mouth daily. 30 tablet 0  . atorvastatin (LIPITOR) 20 MG tablet Take 20 mg by mouth daily.    Marland Kitchen. azelastine (ASTELIN) 0.1 % nasal spray Place 1 spray into both nostrils 2 (two) times daily.    . fluticasone (FLONASE) 50 MCG/ACT nasal spray Place 1 spray into both nostrils daily.    . furosemide (LASIX) 20 MG tablet Take 1 tablet (20 mg total) by mouth daily. 30 tablet 0  . losartan (COZAAR) 100 MG tablet Take 50 mg by mouth daily.     . metoprolol tartrate (LOPRESSOR) 25 MG tablet Take 1 tablet (25 mg total) by mouth 2 (two) times daily. 60 tablet 30  . Multiple Vitamin (MULTIVITAMIN WITH MINERALS) TABS tablet Take 1 tablet by mouth daily.     No current facility-administered medications for this visit.     Allergies:   Penicillins    Social History:  The patient  reports that she has never smoked. She has never used smokeless tobacco. She reports that she drinks about 2.4 - 3.0 oz of alcohol per week . She reports that she does not use drugs.   Family History:  The patient's family history includes Asthma in her maternal grandmother; Dementia in her mother; Heart failure in her  maternal uncle.    ROS:  Please see the history of present illness.   Otherwise, review of systems are positive for none.   All other systems are reviewed and negative.    PHYSICAL EXAM: VS:  BP 129/70   Pulse (!) 55   Ht 5' (1.524 m)   Wt 62.1 kg (136 lb 12.8 oz)   BMI 26.72 kg/m  , BMI Body mass index is 26.72 kg/m. GENERAL:  Well appearing HEENT:  Pupils equal round and reactive, fundi not visualized, oral mucosa unremarkable NECK:  No jugular venous distention, waveform within normal limits, carotid upstroke brisk and symmetric, no bruits, no thyromegalyHEENT LUNGS:  Clear to  auscultation bilaterally HEART:  RRR.  PMI not displaced or sustained,S1 and S2 within normal limits, no S3, no S4, no clicks, no rubs, no murmurs ABD:  Flat, positive bowel sounds normal in frequency in pitch, no bruits, no rebound, no guarding, no midline pulsatile mass, no hepatomegaly, no splenomegaly EXT:  2 plus pulses throughout, no edema, no cyanosis no clubbing SKIN:  No rashes no nodules NEURO:  Cranial nerves II through XII grossly intact, motor grossly intact throughout PSYCH:  Cognitively intact, oriented to person place and time   EKG:  EKG is not ordered today.   Echo 05/13/17: Study Conclusions  - Left ventricle: The cavity size was normal. There was moderate   concentric hypertrophy. Systolic function was vigorous. The   estimated ejection fraction was in the range of 65% to 70%. Wall   motion was normal; there were no regional wall motion   abnormalities. Doppler parameters are consistent with abnormal   left ventricular relaxation (grade 1 diastolic dysfunction).   Doppler parameters are consistent with elevated ventricular   end-diastolic filling pressure. - Aortic valve: Mean gradient (S): 8 mm Hg. Valve area (VTI): 1.69   cm^2. Valve area (Vmax): 1.92 cm^2. Valve area (Vmean): 1.72   cm^2. - Aortic root: The aortic root was normal in size. - Mitral valve: Calcified annulus. Mildly thickened leaflets .   There was mild regurgitation. - Left atrium: The atrium was at the upper limits of normal in   size. - Right ventricle: Systolic function was normal. - Right atrium: The atrium was normal in size. - Tricuspid valve: There was mild regurgitation. - Pulmonic valve: There was no regurgitation. - Pulmonary arteries: Systolic pressure was within the normal   range. - Inferior vena cava: The vessel was normal in size. - Pericardium, extracardiac: There was no pericardial effusion.  Lexiscan Myoview 05/19/17:   The left ventricular ejection fraction is  hyperdynamic (>65%).  Nuclear stress EF: 70%.  The study is normal.  This is a low risk study   Recent Labs: 05/13/2017: TSH 5.387 05/22/2017: B Natriuretic Peptide 78.1 05/23/2017: BUN 19; Creatinine, Ser 0.86; Hemoglobin 10.6; Platelets 388; Potassium 4.4; Sodium 135    Lipid Panel    Component Value Date/Time   CHOL 173 05/13/2017 0121   TRIG 275 (H) 05/13/2017 0121   HDL 48 05/13/2017 0121   CHOLHDL 3.6 05/13/2017 0121   VLDL 55 (H) 05/13/2017 0121   LDLCALC 70 05/13/2017 0121      Wt Readings from Last 3 Encounters:  06/10/17 62.1 kg (136 lb 12.8 oz)  05/23/17 62 kg (136 lb 11 oz)  05/19/17 63 kg (139 lb)      ASSESSMENT AND PLAN:  # Chronic diastolic heart failure:  Ms. Cumbo is doing much better.  She is no longer having heart failure  symptoms.  Continue to limit salt intake to <2g. Continue metoprolol and losartan.  If her fatigue persists we will try another beta blocker.  Continue lasix.   # Hypertension: BP well-controlled.  Continue losartan, metoprolol and lasix.  Check BMP today.  # Hyperlipidemia: LDL 70 05/2017.  Continue atorvastatin.     Current medicines are reviewed at length with the patient today.  The patient does not have concerns regarding medicines.  The following changes have been made:  no change  Labs/ tests ordered today include:   Orders Placed This Encounter  Procedures  . Basic metabolic panel     Disposition:   FU with Dakarai Mcglocklin C. Duke Salvia, MD, [facility] in 2 months.     This note was written with the assistance of speech recognition software.  Please excuse any transcriptional errors.  Signed, Balian Schaller C. Duke Salvia, MD, [facility]  06/10/2017 8:38 AM     [facility]

## 2017-06-10 NOTE — Patient Instructions (Signed)
Medication Instructions:  Your physician recommends that you continue on your current medications as directed. Please refer to the Current Medication list given to you today.  Labwork: BMET TODAY   Testing/Procedures: NONE  Follow-Up: Your physician recommends that you schedule a follow-up appointment in: 2 MONTH OV  If you need a refill on your cardiac medications before your next appointment, please call your pharmacy.

## 2017-06-15 ENCOUNTER — Other Ambulatory Visit: Payer: Self-pay | Admitting: *Deleted

## 2017-06-15 MED ORDER — FUROSEMIDE 20 MG PO TABS: 20.0000 mg | ORAL_TABLET | Freq: Every day | ORAL | 11 refills | Status: DC

## 2017-08-10 ENCOUNTER — Ambulatory Visit (INDEPENDENT_AMBULATORY_CARE_PROVIDER_SITE_OTHER): Payer: 59 | Admitting: Cardiovascular Disease

## 2017-08-10 ENCOUNTER — Encounter: Payer: Self-pay | Admitting: Cardiovascular Disease

## 2017-08-10 VITALS — BP 147/75 | HR 51 | Ht 60.0 in | Wt 138.0 lb

## 2017-08-10 DIAGNOSIS — I5032 Chronic diastolic (congestive) heart failure: Secondary | ICD-10-CM

## 2017-08-10 DIAGNOSIS — I10 Essential (primary) hypertension: Secondary | ICD-10-CM | POA: Diagnosis not present

## 2017-08-10 DIAGNOSIS — E7800 Pure hypercholesterolemia, unspecified: Secondary | ICD-10-CM | POA: Diagnosis not present

## 2017-08-10 NOTE — Progress Notes (Signed)
Cardiology Office Note   Date:  08/10/2017   ID:  Hannah KandBarbara Colon, DOB 01-02-1956, MRN [id number]  PCP:  Burnis MedinFulbright, Virginia E, PA-C  Cardiologist:   Chilton Siiffany [place], MD   Chief Complaint  Patient presents with  . Follow-up    pt denied chest pain and SOB, pt stated she's been doing pretty good     History of Present Illness: Hannah KandBarbara Colon is a 61 y.o. female with chronic diastolic heart failure, hypertension, hyperlipidemia, and left ventricular hypertrophy who presents for follow-up. She was seen in the hospital 05/2017 with chest pain and shortness of breath. She hadn't noted any lower extremity edema but did report orthopnea. Cardiac enzymes M.D.-dimer were negative. EKG was not concerning for ischemia. She had an echocardiogram that revealed LVEF 65-70% with grade 1 diastolic dysfunction and moderate LVH.  She had a very mild outflow tract gradient. She was started on metoprolol and continued with hydrochlorothiazide. She Again presented to the ED 05/22/17 with chest pain that occurred while sitting in a recliner.  She had a Lexiscan Myoview that was negative for ischemia.  Her chest discomfort was thought to be due to gastroesophageal reflux disease.  Her WBC was 18 and she was also thought to have an atypical pneumonia. She was switched from HCTZ to Lasix.  At her last appointment Hannah Colon was doing well.  She continues to feel well physically.  She has been very stressed at work and working long hours.  Her husband also had rotator cuff surgery 05/2017.  He is just returning to work today.  She has not experienced any chest pain or shortness of breath.  She sometimes feels tired.  She has not been exercising much.  She tries to walk for at least one day per week.  She feels okay with exertion.  She denies lower extremity edema, orthopnea, or PND.  Her blood pressure at home has been mostly in the 130s up to 140 over 70s.  She denies palpitations, lightheadedness, or  dizziness. His  Past Medical History:  Diagnosis Date  . Environmental and seasonal allergies   . High cholesterol   . Hypertension   . Hypertensive heart disease 05/13/2017    Past Surgical History:  Procedure Laterality Date  . ABDOMINAL HYSTERECTOMY    . SHOULDER ARTHROSCOPY Right      Current Outpatient Medications  Medication Sig Dispense Refill  . aspirin EC 81 MG EC tablet Take 1 tablet (81 mg total) by mouth daily. 30 tablet 0  . atorvastatin (LIPITOR) 20 MG tablet Take 20 mg by mouth daily.    Marland Kitchen. azelastine (ASTELIN) 0.1 % nasal spray Place 1 spray into both nostrils 2 (two) times daily.    . fluticasone (FLONASE) 50 MCG/ACT nasal spray Place 1 spray into both nostrils daily.    . furosemide (LASIX) 20 MG tablet Take 1 tablet (20 mg total) by mouth daily. 30 tablet 11  . losartan (COZAAR) 100 MG tablet Take 50 mg by mouth daily.     . metoprolol tartrate (LOPRESSOR) 25 MG tablet Take 1 tablet (25 mg total) by mouth 2 (two) times daily. 60 tablet 30  . Multiple Vitamin (MULTIVITAMIN WITH MINERALS) TABS tablet Take 1 tablet by mouth daily.    . VOLTAREN 1 % GEL Apply 1 application daily topically.     No current facility-administered medications for this visit.     Allergies:   Penicillins    Social History:  The patient  reports that  has  never smoked. she has never used smokeless tobacco. She reports that she drinks about 2.4 - 3.0 oz of alcohol per week. She reports that she does not use drugs.   Family History:  The patient's family history includes Asthma in her maternal grandmother; Dementia in her mother; Heart failure in her maternal uncle.    ROS:  Please see the history of present illness.   Otherwise, review of systems are positive for none.   All other systems are reviewed and negative.    PHYSICAL EXAM: VS:  BP (!) 147/75   Pulse (!) 51   Ht 5' (1.524 m)   Wt 62.6 kg (138 lb)   BMI 26.95 kg/m  , BMI Body mass index is 26.95 kg/m. GENERAL:  Well  appearing HEENT: Pupils equal round and reactive, fundi not visualized, oral mucosa unremarkable NECK:  No jugular venous distention, waveform within normal limits, carotid upstroke brisk and symmetric, no bruits, no thyromegaly LUNGS:  Clear to auscultation bilaterally HEART:  RRR.  PMI not displaced or sustained,S1 and S2 within normal limits, no S3, no S4, no clicks, no rubs, no murmurs ABD:  Flat, positive bowel sounds normal in frequency in pitch, no bruits, no rebound, no guarding, no midline pulsatile mass, no hepatomegaly, no splenomegaly EXT:  2 plus pulses throughout, no edema, no cyanosis no clubbing SKIN:  No rashes no nodules NEURO:  Cranial nerves II through XII grossly intact, motor grossly intact throughout PSYCH:  Cognitively intact, oriented to person place and time   EKG:  EKG is not ordered today.   Echo 05/13/17: Study Conclusions  - Left ventricle: The cavity size was normal. There was moderate   concentric hypertrophy. Systolic function was vigorous. The   estimated ejection fraction was in the range of 65% to 70%. Wall   motion was normal; there were no regional wall motion   abnormalities. Doppler parameters are consistent with abnormal   left ventricular relaxation (grade 1 diastolic dysfunction).   Doppler parameters are consistent with elevated ventricular   end-diastolic filling pressure. - Aortic valve: Mean gradient (S): 8 mm Hg. Valve area (VTI): 1.69   cm^2. Valve area (Vmax): 1.92 cm^2. Valve area (Vmean): 1.72   cm^2. - Aortic root: The aortic root was normal in size. - Mitral valve: Calcified annulus. Mildly thickened leaflets .   There was mild regurgitation. - Left atrium: The atrium was at the upper limits of normal in   size. - Right ventricle: Systolic function was normal. - Right atrium: The atrium was normal in size. - Tricuspid valve: There was mild regurgitation. - Pulmonic valve: There was no regurgitation. - Pulmonary arteries:  Systolic pressure was within the normal   range. - Inferior vena cava: The vessel was normal in size. - Pericardium, extracardiac: There was no pericardial effusion.  Lexiscan Myoview 05/19/17:   The left ventricular ejection fraction is hyperdynamic (>65%).  Nuclear stress EF: 70%.  The study is normal.  This is a low risk study   Recent Labs: 05/13/2017: TSH 5.387 05/22/2017: B Natriuretic Peptide 78.1 05/23/2017: Hemoglobin 10.6; Platelets 388 06/10/2017: BUN 16; Creatinine, Ser 0.73; Potassium 4.4; Sodium 139   06/29/17: Sodium 140, potassium 5.0, BUN 17, creatinine 0.67 AST 22, ALT 23 Total cholesterol 159, triglycerides 121, HDL 57, LDL 78 Hemoglobin [id number] 5.3% TSH 1.69  Lipid Panel    Component Value Date/Time   CHOL 173 05/13/2017 0121   TRIG 275 (H) 05/13/2017 0121   HDL 48 05/13/2017 0121  CHOLHDL 3.6 05/13/2017 0121   VLDL 55 (H) 05/13/2017 0121   LDLCALC 70 05/13/2017 0121      Wt Readings from Last 3 Encounters:  08/10/17 62.6 kg (138 lb)  06/10/17 62.1 kg (136 lb 12.8 oz)  05/23/17 62 kg (136 lb 11 oz)      ASSESSMENT AND PLAN:  # Chronic diastolic heart failure:  Hannah Colon is doing well.  She is euvolemic.  She is no longer having heart failure symptoms.  Continue to limit salt intake to <2g. Continue metoprolol, losartan, and furosemide.   # Hypertension: BP above goal.  She attributes this to stress at work and at home.  She is hesitant to increase her medication, as she struggled with dizziness when on higher doses.  She will start back with her exercise routine.  Continue losartan and metoprolol.    # Hyperlipidemia: LDL 78 06/2017.  Continue atorvastatin.     Current medicines are reviewed at length with the patient today.  The patient does not have concerns regarding medicines.  The following changes have been made:  no change  Labs/ tests ordered today include:   No orders of the defined types were placed in this  encounter.    Disposition:   FU with Arling Cerone C. Duke Salvia, MD, [facility] in 6 months.     This note was written with the assistance of speech recognition software.  Please excuse any transcriptional errors.  Signed, Terren Jandreau C. Duke Salvia, MD, [facility]  08/10/2017 8:57 AM    [facility]

## 2017-08-10 NOTE — Patient Instructions (Signed)

## 2017-09-28 ENCOUNTER — Telehealth: Payer: Self-pay | Admitting: Cardiovascular Disease

## 2017-09-28 NOTE — Telephone Encounter (Signed)
New Message     STAT if HR is under 50 or over 120 (normal HR is 60-100 beats per minute)  1) What is your heart rate? 102   2) Do you have a log of your heart rate readings (document readings)?  83 hr 102 , O2- 7p 89 hr 86  144p o2 89 hr 69  1246 o2 89 hr 90 ,  12/24 1036 o2 88 hr 90 , 834p o2 89 hr 105  638 o2 90 hr 102  1239p o2 88 hr 91  3) Do you have any other symptoms? Oxygen level is down to 83, chronic fatigue , this started Friday, she was ok Saturday and Sunday, then it started Monday it started happening again

## 2017-09-28 NOTE — Telephone Encounter (Signed)
Returned call to pt she states that starting Friday her O2 sat has been low and the HR has been in the high 100's. She states that she frequently and intermittently while at rest she feels SOB. Recently she states that she has been fatigued. She states that she has trouble taking deep breath when O2 was 83% she states that she was "not really" SOB when this happened. But she states that it did scare her. She denies any swelling and states that her weight has been at her dry weight. She states that her O2 sat has been ranging 83-95% with HR ranging 89-102. She states that while at her PCP last Monday she had lab done and states that PCP dx her with Lupus. This is a new dx. She states that she has appt to discuss with Rheumatologist 10-18-17.  Right now she does not sound SOB when talking on the phone she took O2 sat while I waited on the and currently O2 sat 95% and HR 82 she states that she does not feel SOB right now and she is going to continue to rest and she states that she will go to the ER if O2 gets low again. [facility].She states that she does not think that she needs to go now but states that she will when she feels SOB and O2 sat goes down again.

## 2017-09-30 ENCOUNTER — Encounter: Payer: Self-pay | Admitting: Cardiology

## 2017-09-30 ENCOUNTER — Ambulatory Visit (INDEPENDENT_AMBULATORY_CARE_PROVIDER_SITE_OTHER): Payer: 59 | Admitting: Cardiology

## 2017-09-30 DIAGNOSIS — R0600 Dyspnea, unspecified: Secondary | ICD-10-CM

## 2017-09-30 DIAGNOSIS — R0609 Other forms of dyspnea: Secondary | ICD-10-CM | POA: Diagnosis not present

## 2017-09-30 DIAGNOSIS — I5031 Acute diastolic (congestive) heart failure: Secondary | ICD-10-CM

## 2017-09-30 DIAGNOSIS — I5032 Chronic diastolic (congestive) heart failure: Secondary | ICD-10-CM

## 2017-09-30 DIAGNOSIS — R079 Chest pain, unspecified: Secondary | ICD-10-CM | POA: Diagnosis not present

## 2017-09-30 DIAGNOSIS — R011 Cardiac murmur, unspecified: Secondary | ICD-10-CM

## 2017-09-30 DIAGNOSIS — I110 Hypertensive heart disease with heart failure: Secondary | ICD-10-CM

## 2017-09-30 MED ORDER — METOPROLOL TARTRATE 50 MG PO TABS: 50.0000 mg | ORAL_TABLET | Freq: Two times a day (BID) | ORAL | 6 refills | Status: DC

## 2017-09-30 NOTE — Assessment & Plan Note (Signed)
Intermittent acute DOE and fatigue

## 2017-09-30 NOTE — Assessment & Plan Note (Signed)
Pt presents with symptoms of DOE and hypoxia (measured on her iphone)

## 2017-09-30 NOTE — Progress Notes (Signed)
09/30/2017 Hannah Colon   1956/01/12  [id number]  Primary Physician Piedad ClimesFulbright, Deloria LairVirginia E, PA-C Primary Cardiologist: Dr Duke Salviaandolph  HPI:  61 y/o Caucasian female, seen by cardiology 05/13/17 for DOE and murmur. Echgo then showed normal LVF with moderate LVH, elevated end diastolic pressure, and small LVOT gradient. She was not felt to have HOCM. Myoview was low risk. She was diuresed and her B/P medications adjusted. She was discharged but re admitted a week later with the same symptoms. CTA was negative for PE but notable for diffuse interstitial prominence. She also had leukocytosis. She was treated with ABs and diuresed further. She saw Dr Duke Salviaandolph in Nov and was doing well.  She is in the office today with complaints or recurrent DOE, exertional fatigue and chest pain. She is able to monitor her O2 sats by her phone and has noted her O2 sats in the high 70's and low 80's when she has symptoms. She took extra Lasix with some improvement. Her wgt remains stable. In the office today she has a prominent systolic murmur heard throughout the precordium. She is tachycardic at rest. O2 sat 94%.    Current Outpatient Medications  Medication Sig Dispense Refill  . aspirin EC 81 MG EC tablet Take 1 tablet (81 mg total) by mouth daily. 30 tablet 0  . atorvastatin (LIPITOR) 20 MG tablet Take 20 mg by mouth daily.    Marland Kitchen. azelastine (ASTELIN) 0.1 % nasal spray Place 1 spray into both nostrils 2 (two) times daily.    . fluticasone (FLONASE) 50 MCG/ACT nasal spray Place 1 spray into both nostrils daily.    . furosemide (LASIX) 20 MG tablet Take 1 tablet (20 mg total) by mouth daily. 30 tablet 11  . losartan (COZAAR) 50 MG tablet Take 1 tablet by mouth daily.    . metoprolol tartrate (LOPRESSOR) 25 MG tablet Take 1 tablet (25 mg total) by mouth 2 (two) times daily. 60 tablet 30  . Multiple Vitamin (MULTIVITAMIN WITH MINERALS) TABS tablet Take 1 tablet by mouth daily.    . VOLTAREN 1 % GEL Apply 1  application daily topically.     No current facility-administered medications for this visit.     Allergies  Allergen Reactions  . Penicillins Hives and Rash    Has patient had a PCN reaction causing immediate rash, facial/tongue/throat swelling, SOB or lightheadedness with hypotension: No Has patient had a PCN reaction causing severe rash involving mucus membranes or skin necrosis: No Has patient had a PCN reaction that required hospitalization: No Has patient had a PCN reaction occurring within the last 10 years: Yes If all of the above answers are "NO", then may proceed with Cephalosporin use.     Past Medical History:  Diagnosis Date  . Environmental and seasonal allergies   . High cholesterol   . Hypertension   . Hypertensive heart disease 05/13/2017    Social History   Socioeconomic History  . Marital status: Married    Spouse name: Not on file  . Number of children: Not on file  . Years of education: Not on file  . Highest education level: Not on file  Social Needs  . Financial resource strain: Not on file  . Food insecurity - worry: Not on file  . Food insecurity - inability: Not on file  . Transportation needs - medical: Not on file  . Transportation needs - non-medical: Not on file  Occupational History  . Occupation: accounting  Tobacco Use  . Smoking  status: Never Smoker  . Smokeless tobacco: Never Used  Substance and Sexual Activity  . Alcohol use: Yes    Alcohol/week: 2.4 - 3.0 oz    Types: 4 - 5 Glasses of wine per week  . Drug use: No  . Sexual activity: Not on file  Other Topics Concern  . Not on file  Social History Narrative  . Not on file     Family History  Problem Relation Age of Onset  . Dementia Mother   . Asthma Maternal Grandmother   . Heart failure Maternal Uncle      Review of Systems: General: negative for chills, fever, night sweats or weight changes.  Cardiovascular: negative for chest pain, dyspnea on exertion, edema,  orthopnea, palpitations, paroxysmal nocturnal dyspnea or shortness of breath Dermatological: negative for rash Respiratory: negative for cough or wheezing Urologic: negative for hematuria Abdominal: negative for nausea, vomiting, diarrhea, bright red blood per rectum, melena, or hematemesis Neurologic: negative for visual changes, syncope, or dizziness The pt is going to be evaluated for possible Lupus by [facility]  All other systems reviewed and are otherwise negative except as noted above.    Blood pressure 119/69, pulse (!) 103, height 5' (1.524 m), weight 138 lb (62.6 kg), SpO2 94 %.  General appearance: alert, cooperative, no distress and mildly obese Neck: no carotid bruit and no JVD Lungs: basilar crackles Heart: regular rate and rhythm and 3/6 systolic murmur heard throughout the precordium, diminnished S2 Extremities: no edema Pulses: 2+ and symmetric Skin: Skin color, texture, turgor normal. No rashes or lesions Neurologic: Grossly normal  EKG NSR, ST, LVH with repol  ASSESSMENT AND PLAN:   Acute diastolic heart failure (HCC) Pt presents with symptoms of DOE and hypoxia (measured on her iphone)  Dyspnea on exertion Intermittent acute DOE and fatigue  Chest pain on exertion Low risk Myoview Aug 2018, this could be secondary to her diastolic CHF  Heart murmur  3/6 systolic outflow murmur-echo Aug 2018 showed normal LVF-65-70% with grade 1 DD  Hypertensive heart disease Moderate LVH on echo   PLAN  Pt was seen by Dr Duke Salviaandolph and myself in the offcie. The pt will take an extra 40 mg of Lasix today and take Lasix 40 mg over the weekend. If she feels better Monday she'll go back to 20 mg. We also increased her Metoprolol to 50 mg BID. She'll need a f/u in a week. BNP and BMP today.  Corine ShelterLuke Miller [place] PA-C 09/30/2017 8:59 AM

## 2017-09-30 NOTE — Assessment & Plan Note (Signed)
Moderate LVH on echo 

## 2017-09-30 NOTE — Assessment & Plan Note (Signed)
Low risk Myoview Aug 2018 

## 2017-09-30 NOTE — Patient Instructions (Signed)
Medication Instructions:  INCREASE- Furosemide 40 mg today, and over the weekend and if feeling better go to 20 mg on Monday. INCREASE- Metoprolol 50 mg twice a day If you need a refill on your cardiac medications before your next appointment, please call your pharmacy.  Labwork: BNP and BMP  Testing/Procedures: None Ordered  Special Instructions:  Happy New Years!!!  Follow-Up: Your physician wants you to follow-up in: January 4th with Corine ShelterLuke Kilroy.    Thank you for choosing [facility]!!

## 2017-09-30 NOTE — Assessment & Plan Note (Signed)
3/6 systolic outflow murmur-echo Aug 2018 showed normal LVF-65-70% with grade 1 DD

## 2017-10-01 LAB — BRAIN NATRIURETIC PEPTIDE: BNP: 125.8 pg/mL — ABNORMAL HIGH (ref 0.0–100.0)

## 2017-10-01 LAB — BASIC METABOLIC PANEL
BUN/Creatinine Ratio: 23 (ref 12–28)
BUN: 15 mg/dL (ref 8–27)
CO2: 22 mmol/L (ref 20–29)
Calcium: 10.2 mg/dL (ref 8.7–10.3)
Chloride: 100 mmol/L (ref 96–106)
Creatinine, Ser: 0.65 mg/dL (ref 0.57–1.00)
GFR calc Af Amer: 112 mL/min/{1.73_m2} (ref >59)
GFR calc non Af Amer: 97 mL/min/{1.73_m2} (ref >59)
Glucose: 88 mg/dL (ref 65–99)
Potassium: 4.4 mmol/L (ref 3.5–5.2)
Sodium: 140 mmol/L (ref 134–144)

## 2017-10-02 ENCOUNTER — Emergency Department (HOSPITAL_COMMUNITY): Payer: 59

## 2017-10-02 ENCOUNTER — Observation Stay (HOSPITAL_COMMUNITY)
Admission: EM | Admit: 2017-10-02 | Discharge: 2017-10-04 | Disposition: A | Discharge status: Home / Self Care | Payer: 59 | Source: Home / Self Care | Attending: Emergency Medicine | Admitting: Emergency Medicine

## 2017-10-02 ENCOUNTER — Encounter (HOSPITAL_COMMUNITY): Payer: Self-pay | Admitting: Emergency Medicine

## 2017-10-02 DIAGNOSIS — J310 Chronic rhinitis: Secondary | ICD-10-CM

## 2017-10-02 DIAGNOSIS — R011 Cardiac murmur, unspecified: Secondary | ICD-10-CM | POA: Diagnosis present

## 2017-10-02 DIAGNOSIS — I340 Nonrheumatic mitral (valve) insufficiency: Secondary | ICD-10-CM | POA: Diagnosis present

## 2017-10-02 DIAGNOSIS — I119 Hypertensive heart disease without heart failure: Secondary | ICD-10-CM | POA: Diagnosis present

## 2017-10-02 DIAGNOSIS — E669 Obesity, unspecified: Secondary | ICD-10-CM | POA: Diagnosis present

## 2017-10-02 DIAGNOSIS — R40241 Glasgow coma scale score 13-15, unspecified time: Secondary | ICD-10-CM | POA: Diagnosis present

## 2017-10-02 DIAGNOSIS — Z880 Allergy status to penicillin: Secondary | ICD-10-CM

## 2017-10-02 DIAGNOSIS — E785 Hyperlipidemia, unspecified: Secondary | ICD-10-CM | POA: Diagnosis present

## 2017-10-02 DIAGNOSIS — E7800 Pure hypercholesterolemia, unspecified: Secondary | ICD-10-CM | POA: Diagnosis present

## 2017-10-02 DIAGNOSIS — Z79899 Other long term (current) drug therapy: Secondary | ICD-10-CM

## 2017-10-02 DIAGNOSIS — Y92230 Patient room in hospital as the place of occurrence of the external cause: Secondary | ICD-10-CM | POA: Diagnosis not present

## 2017-10-02 DIAGNOSIS — J9621 Acute and chronic respiratory failure with hypoxia: Secondary | ICD-10-CM | POA: Diagnosis present

## 2017-10-02 DIAGNOSIS — K219 Gastro-esophageal reflux disease without esophagitis: Secondary | ICD-10-CM | POA: Diagnosis present

## 2017-10-02 DIAGNOSIS — R739 Hyperglycemia, unspecified: Secondary | ICD-10-CM | POA: Diagnosis present

## 2017-10-02 DIAGNOSIS — J9601 Acute respiratory failure with hypoxia: Secondary | ICD-10-CM | POA: Diagnosis present

## 2017-10-02 DIAGNOSIS — K5900 Constipation, unspecified: Secondary | ICD-10-CM | POA: Diagnosis not present

## 2017-10-02 DIAGNOSIS — Z7982 Long term (current) use of aspirin: Secondary | ICD-10-CM

## 2017-10-02 DIAGNOSIS — D509 Iron deficiency anemia, unspecified: Secondary | ICD-10-CM | POA: Diagnosis present

## 2017-10-02 DIAGNOSIS — E876 Hypokalemia: Secondary | ICD-10-CM | POA: Diagnosis present

## 2017-10-02 DIAGNOSIS — B349 Viral infection, unspecified: Secondary | ICD-10-CM | POA: Diagnosis present

## 2017-10-02 DIAGNOSIS — I5033 Acute on chronic diastolic (congestive) heart failure: Secondary | ICD-10-CM | POA: Diagnosis present

## 2017-10-02 DIAGNOSIS — Z825 Family history of asthma and other chronic lower respiratory diseases: Secondary | ICD-10-CM

## 2017-10-02 DIAGNOSIS — D638 Anemia in other chronic diseases classified elsewhere: Secondary | ICD-10-CM | POA: Diagnosis present

## 2017-10-02 DIAGNOSIS — Z91041 Radiographic dye allergy status: Secondary | ICD-10-CM

## 2017-10-02 DIAGNOSIS — I421 Obstructive hypertrophic cardiomyopathy: Secondary | ICD-10-CM | POA: Diagnosis present

## 2017-10-02 DIAGNOSIS — I509 Heart failure, unspecified: Secondary | ICD-10-CM

## 2017-10-02 DIAGNOSIS — Z6826 Body mass index (BMI) 26.0-26.9, adult: Secondary | ICD-10-CM

## 2017-10-02 DIAGNOSIS — J849 Interstitial pulmonary disease, unspecified: Secondary | ICD-10-CM | POA: Diagnosis present

## 2017-10-02 DIAGNOSIS — R700 Elevated erythrocyte sedimentation rate: Secondary | ICD-10-CM | POA: Diagnosis not present

## 2017-10-02 DIAGNOSIS — Z8249 Family history of ischemic heart disease and other diseases of the circulatory system: Secondary | ICD-10-CM

## 2017-10-02 DIAGNOSIS — R0902 Hypoxemia: Secondary | ICD-10-CM | POA: Diagnosis not present

## 2017-10-02 DIAGNOSIS — J302 Other seasonal allergic rhinitis: Secondary | ICD-10-CM | POA: Diagnosis present

## 2017-10-02 DIAGNOSIS — T502X5A Adverse effect of carbonic-anhydrase inhibitors, benzothiadiazides and other diuretics, initial encounter: Secondary | ICD-10-CM | POA: Diagnosis not present

## 2017-10-02 DIAGNOSIS — I110 Hypertensive heart disease with heart failure: Secondary | ICD-10-CM | POA: Diagnosis present

## 2017-10-02 DIAGNOSIS — R578 Other shock: Secondary | ICD-10-CM | POA: Diagnosis not present

## 2017-10-02 DIAGNOSIS — Z91018 Allergy to other foods: Secondary | ICD-10-CM

## 2017-10-02 DIAGNOSIS — Z90710 Acquired absence of both cervix and uterus: Secondary | ICD-10-CM

## 2017-10-02 DIAGNOSIS — I959 Hypotension, unspecified: Secondary | ICD-10-CM | POA: Diagnosis not present

## 2017-10-02 LAB — BASIC METABOLIC PANEL
ANION GAP: 9 (ref 5–15)
BUN: 16 mg/dL (ref 6–20)
CO2: 26 mmol/L (ref 22–32)
Calcium: 9.5 mg/dL (ref 8.9–10.3)
Chloride: 100 mmol/L — ABNORMAL LOW (ref 101–111)
Creatinine, Ser: 0.81 mg/dL (ref 0.44–1.00)
GFR calc Af Amer: >60 mL/min (ref >60)
Glucose, Bld: 119 mg/dL — ABNORMAL HIGH (ref 65–99)
POTASSIUM: 3.6 mmol/L (ref 3.5–5.1)
SODIUM: 135 mmol/L (ref 135–145)

## 2017-10-02 LAB — CBC
HEMATOCRIT: 32.2 % — AB (ref 36.0–46.0)
HEMOGLOBIN: 10.8 g/dL — AB (ref 12.0–15.0)
MCH: 29.8 pg (ref 26.0–34.0)
MCHC: 33.5 g/dL (ref 30.0–36.0)
MCV: 89 fL (ref 78.0–100.0)
Platelets: 418 10*3/uL — ABNORMAL HIGH (ref 150–400)
RBC: 3.62 MIL/uL — ABNORMAL LOW (ref 3.87–5.11)
RDW: 15 % (ref 11.5–15.5)
WBC: 16 10*3/uL — AB (ref 4.0–10.5)

## 2017-10-02 LAB — I-STAT TROPONIN, ED: Troponin i, poc: 0 ng/mL (ref 0.00–0.08)

## 2017-10-02 NOTE — ED Notes (Signed)
Bed: WA20 Expected date:  Expected time:  Means of arrival:  Comments: T1

## 2017-10-02 NOTE — ED Triage Notes (Signed)
Patient c/o chest pain for past two weeks. Pain feels like something is sitting on her chest and dull, non radiating. Pt also having low oxygen saturation in the mid 80s when ambulatory. When pt sat down and took deep breathes oxygen up to 92% on RA. Pt states lasix recently changed to 40mg  and metoprolol to 50mg  BID.

## 2017-10-02 NOTE — ED Notes (Signed)
Bed: WLPT1 Expected date:  Expected time:  Means of arrival:  Comments: 

## 2017-10-03 ENCOUNTER — Encounter (HOSPITAL_COMMUNITY): Payer: Self-pay | Admitting: Internal Medicine

## 2017-10-03 ENCOUNTER — Other Ambulatory Visit: Payer: Self-pay

## 2017-10-03 DIAGNOSIS — J9601 Acute respiratory failure with hypoxia: Secondary | ICD-10-CM | POA: Diagnosis not present

## 2017-10-03 DIAGNOSIS — R739 Hyperglycemia, unspecified: Secondary | ICD-10-CM | POA: Diagnosis not present

## 2017-10-03 DIAGNOSIS — E785 Hyperlipidemia, unspecified: Secondary | ICD-10-CM | POA: Diagnosis not present

## 2017-10-03 DIAGNOSIS — I110 Hypertensive heart disease with heart failure: Secondary | ICD-10-CM | POA: Diagnosis not present

## 2017-10-03 DIAGNOSIS — I5033 Acute on chronic diastolic (congestive) heart failure: Secondary | ICD-10-CM | POA: Diagnosis not present

## 2017-10-03 DIAGNOSIS — J310 Chronic rhinitis: Secondary | ICD-10-CM | POA: Diagnosis not present

## 2017-10-03 DIAGNOSIS — I5032 Chronic diastolic (congestive) heart failure: Secondary | ICD-10-CM | POA: Diagnosis not present

## 2017-10-03 LAB — CBC
HCT: 32.1 % — ABNORMAL LOW (ref 36.0–46.0)
Hemoglobin: 10.7 g/dL — ABNORMAL LOW (ref 12.0–15.0)
MCH: 29.7 pg (ref 26.0–34.0)
MCHC: 33.3 g/dL (ref 30.0–36.0)
MCV: 89.2 fL (ref 78.0–100.0)
PLATELETS: 389 10*3/uL (ref 150–400)
RBC: 3.6 MIL/uL — AB (ref 3.87–5.11)
RDW: 15 % (ref 11.5–15.5)
WBC: 9.6 10*3/uL (ref 4.0–10.5)

## 2017-10-03 LAB — HEMOGLOBIN A1C
Hgb A1c MFr Bld: 5.5 % (ref 4.8–5.6)
MEAN PLASMA GLUCOSE: 111.15 mg/dL

## 2017-10-03 LAB — CREATININE, SERUM
CREATININE: 0.65 mg/dL (ref 0.44–1.00)
GFR calc non Af Amer: >60 mL/min (ref >60)

## 2017-10-03 LAB — BRAIN NATRIURETIC PEPTIDE: B Natriuretic Peptide: 162.9 pg/mL — ABNORMAL HIGH (ref 0.0–100.0)

## 2017-10-03 LAB — TSH: TSH: 2.477 u[IU]/mL (ref 0.350–4.500)

## 2017-10-03 LAB — MAGNESIUM: MAGNESIUM: 1.9 mg/dL (ref 1.7–2.4)

## 2017-10-03 MED ORDER — AZELASTINE HCL 0.1 % NA SOLN
1.0000 | Freq: Two times a day (BID) | NASAL | Status: DC
  Administered 2017-10-03 – 2017-10-04 (×3): 1 via NASAL
  Filled 2017-10-03: qty 30

## 2017-10-03 MED ORDER — METOPROLOL TARTRATE 50 MG PO TABS
50.0000 mg | ORAL_TABLET | Freq: Two times a day (BID) | ORAL | Status: DC
  Administered 2017-10-03: 50 mg via ORAL
  Filled 2017-10-03 (×2): qty 1

## 2017-10-03 MED ORDER — FAMOTIDINE 20 MG PO TABS
20.0000 mg | ORAL_TABLET | Freq: Every day | ORAL | Status: DC
  Administered 2017-10-03 – 2017-10-04 (×2): 20 mg via ORAL
  Filled 2017-10-03 (×2): qty 1

## 2017-10-03 MED ORDER — ACETAMINOPHEN 325 MG PO TABS: 650.0000 mg | ORAL_TABLET | ORAL | Status: DC | PRN

## 2017-10-03 MED ORDER — FUROSEMIDE 10 MG/ML IJ SOLN
40.0000 mg | Freq: Two times a day (BID) | INTRAMUSCULAR | Status: DC
  Administered 2017-10-03: 40 mg via INTRAVENOUS
  Filled 2017-10-03: qty 4

## 2017-10-03 MED ORDER — ADULT MULTIVITAMIN W/MINERALS CH
1.0000 | ORAL_TABLET | Freq: Every day | ORAL | Status: DC
  Administered 2017-10-03 – 2017-10-04 (×2): 1 via ORAL
  Filled 2017-10-03 (×2): qty 1

## 2017-10-03 MED ORDER — SODIUM CHLORIDE 0.9% FLUSH: 3.0000 mL | INTRAVENOUS | Status: DC | PRN

## 2017-10-03 MED ORDER — FUROSEMIDE 10 MG/ML IJ SOLN
40.0000 mg | Freq: Once | INTRAMUSCULAR | Status: DC
  Administered 2017-10-03: 40 mg via INTRAVENOUS
  Filled 2017-10-03: qty 4

## 2017-10-03 MED ORDER — HYDRALAZINE HCL 20 MG/ML IJ SOLN: 10.0000 mg | Freq: Three times a day (TID) | INTRAMUSCULAR | Status: DC | PRN

## 2017-10-03 MED ORDER — FUROSEMIDE 10 MG/ML IJ SOLN
40.0000 mg | Freq: Once | INTRAMUSCULAR | Status: AC
  Administered 2017-10-03: 40 mg via INTRAVENOUS
  Filled 2017-10-03: qty 4

## 2017-10-03 MED ORDER — ATORVASTATIN CALCIUM 20 MG PO TABS
20.0000 mg | ORAL_TABLET | Freq: Every day | ORAL | Status: DC
  Administered 2017-10-04: 20 mg via ORAL
  Filled 2017-10-03 (×2): qty 1

## 2017-10-03 MED ORDER — LOSARTAN POTASSIUM 50 MG PO TABS
50.0000 mg | ORAL_TABLET | Freq: Every day | ORAL | Status: DC
  Filled 2017-10-03: qty 1

## 2017-10-03 MED ORDER — ONDANSETRON HCL 4 MG/2ML IJ SOLN: 4.0000 mg | Freq: Four times a day (QID) | INTRAMUSCULAR | Status: DC | PRN

## 2017-10-03 MED ORDER — ACETAMINOPHEN 325 MG PO TABS: 650.0000 mg | ORAL_TABLET | Freq: Four times a day (QID) | ORAL | Status: DC | PRN

## 2017-10-03 MED ORDER — FLUTICASONE PROPIONATE 50 MCG/ACT NA SUSP
1.0000 | Freq: Every day | NASAL | Status: DC
  Administered 2017-10-03 – 2017-10-04 (×2): 1 via NASAL
  Filled 2017-10-03: qty 16

## 2017-10-03 MED ORDER — SODIUM CHLORIDE 0.9 % IV SOLN: 250.0000 mL | INTRAVENOUS | Status: DC | PRN

## 2017-10-03 MED ORDER — SODIUM CHLORIDE 0.9% FLUSH
3.0000 mL | Freq: Two times a day (BID) | INTRAVENOUS | Status: DC
  Administered 2017-10-03 (×2): 3 mL via INTRAVENOUS

## 2017-10-03 MED ORDER — ASPIRIN EC 81 MG PO TBEC
81.0000 mg | DELAYED_RELEASE_TABLET | Freq: Every day | ORAL | Status: DC
  Administered 2017-10-03 – 2017-10-04 (×2): 81 mg via ORAL
  Filled 2017-10-03 (×2): qty 1

## 2017-10-03 MED ORDER — ENOXAPARIN SODIUM 40 MG/0.4ML ~~LOC~~ SOLN
40.0000 mg | SUBCUTANEOUS | Status: DC
  Administered 2017-10-03: 40 mg via SUBCUTANEOUS
  Filled 2017-10-03: qty 0.4

## 2017-10-03 MED ORDER — NITROGLYCERIN 2 % TD OINT
1.0000 [in_us] | TOPICAL_OINTMENT | Freq: Once | TRANSDERMAL | Status: DC
  Administered 2017-10-03: 1 [in_us] via TOPICAL
  Filled 2017-10-03: qty 1

## 2017-10-03 NOTE — ED Notes (Signed)
Patient ambulated in hallway with pulse oximetry monitored, patient tolerated ambulating with sats between 85-94% on room air.

## 2017-10-03 NOTE — ED Provider Notes (Signed)
[facility]-EMERGENCY DEPT Provider Note   CSN: [id number] Arrival date & time: 10/02/17  2029     History   Chief Complaint Chief Complaint  Patient presents with  . Chest Pain  . Low Oxygen    HPI Hannah Colon is a 61 y.o. female.  Patient is a 61 year old female with past medical history of congestive heart failure presenting for evaluation of shortness of breath and low oxygen saturations. She was seen at the cardiology clinic on Friday and had her dose of Lasix increased. She has been experiencing more dyspnea and some tightness in her upper chest with exertion. She measured her oxygen saturations on her phone and they were found to be in the 80s. She presents for evaluation of this. She denies any fevers, chills, or productive cough.   The history is provided by the patient.  Shortness of Breath  This is a new problem. The average episode lasts 3 days. The problem occurs continuously.The problem has been gradually worsening. Associated symptoms include chest pain. Pertinent negatives include no fever, no cough, no sputum production and no wheezing. Treatments tried: Increasing Lasix dose. The treatment provided no relief.    Past Medical History:  Diagnosis Date  . Environmental and seasonal allergies   . High cholesterol   . Hypertension   . Hypertensive heart disease 05/13/2017    Patient Active Problem List   Diagnosis Date Noted  . CAP (community acquired pneumonia) 05/22/2017  . Dyspnea on exertion   . Acute respiratory failure with hypoxia (HCC) 05/13/2017  . Chest pain on exertion 05/13/2017  . Hyperlipidemia 05/13/2017  . Hyperglycemia 05/13/2017  . Heart murmur 05/13/2017  . Hypertensive heart disease 05/13/2017  . Acute diastolic heart failure (HCC)   . LVH (left ventricular hypertrophy)   . Respiratory distress 05/12/2017    Past Surgical History:  Procedure Laterality Date  . ABDOMINAL HYSTERECTOMY    . SHOULDER  ARTHROSCOPY Right     OB History    No data available       Home Medications    Prior to Admission medications   Medication Sig Start Date End Date Taking? Authorizing Provider  aspirin EC 81 MG EC tablet Take 1 tablet (81 mg total) by mouth daily. 05/14/17   Elgergawy, Leana Roe, MD  atorvastatin (LIPITOR) 20 MG tablet Take 20 mg by mouth daily.    [provider]  azelastine (ASTELIN) 0.1 % nasal spray Place 1 spray into both nostrils 2 (two) times daily.    [provider]  fluticasone (FLONASE) 50 MCG/ACT nasal spray Place 1 spray into both nostrils daily.    [provider]  furosemide (LASIX) 20 MG tablet Take 1 tablet (20 mg total) by mouth daily. 06/15/17 09/30/17  Chilton Si, MD  losartan (COZAAR) 50 MG tablet Take 1 tablet by mouth daily. 09/16/17   [provider]  metoprolol tartrate (LOPRESSOR) 50 MG tablet Take 1 tablet (50 mg total) by mouth 2 (two) times daily. 09/30/17   Abelino Derrick, PA-C  Multiple Vitamin (MULTIVITAMIN WITH MINERALS) TABS tablet Take 1 tablet by mouth daily.    [provider]  VOLTAREN 1 % GEL Apply 1 application daily topically. 07/26/17   [provider]    Family History Family History  Problem Relation Age of Onset  . Dementia Mother   . Asthma Maternal Grandmother   . Heart failure Maternal Uncle     Social History Social History   Tobacco Use  .  Smoking status: Never Smoker  . Smokeless tobacco: Never Used  Substance Use Topics  . Alcohol use: Yes    Alcohol/week: 2.4 - 3.0 oz    Types: 4 - 5 Glasses of wine per week  . Drug use: No     Allergies   Penicillins   Review of Systems Review of Systems  Constitutional: Negative for fever.  Respiratory: Positive for shortness of breath. Negative for cough, sputum production and wheezing.   Cardiovascular: Positive for chest pain.  All other systems reviewed and are negative.    Physical Exam Updated Vital  Signs BP (!) 110/56 (BP Location: Right Arm)   Pulse 80   Temp 99.3 F (37.4 C)   Resp 16   SpO2 93%   Physical Exam  Constitutional: She is oriented to person, place, and time. She appears well-developed and well-nourished. No distress.  HENT:  Head: Normocephalic and atraumatic.  Neck: Normal range of motion. Neck supple.  Cardiovascular: Normal rate and regular rhythm. Exam reveals no gallop and no friction rub.  Murmur heard.  Systolic murmur is present with a grade of 3/6. Pulmonary/Chest: Effort normal. No respiratory distress. She has no wheezes. She has rales in the right lower field and the left lower field.  Abdominal: Soft. Bowel sounds are normal. She exhibits no distension. There is no tenderness.  Musculoskeletal: Normal range of motion.       Right lower leg: Normal. She exhibits no edema.       Left lower leg: Normal. She exhibits no edema.  Neurological: She is alert and oriented to person, place, and time.  Skin: Skin is warm and dry. She is not diaphoretic.  Nursing note and vitals reviewed.    ED Treatments / Results  Labs (all labs ordered are listed, but only abnormal results are displayed) Labs Reviewed  BASIC METABOLIC PANEL - Abnormal; Notable for the following components:      Result Value   Chloride 100 (*)    Glucose, Bld 119 (*)    All other components within normal limits  CBC - Abnormal; Notable for the following components:   WBC 16.0 (*)    RBC 3.62 (*)    Hemoglobin 10.8 (*)    HCT 32.2 (*)    Platelets 418 (*)    All other components within normal limits  BRAIN NATRIURETIC PEPTIDE  I-STAT TROPONIN, ED    EKG  EKG Interpretation None       Radiology Dg Chest 2 View  Result Date: 10/02/2017 CLINICAL DATA:  Central chest pain and shortness of breath EXAM: CHEST  2 VIEW COMPARISON:  May 21, 2017 FINDINGS: The heart size and mediastinal contours are within normal limits. Both lungs are clear. Chronic compression deformity of a  lower thoracic vertebral bodies unchanged. IMPRESSION: No active cardiopulmonary disease. Electronically Signed   By: Sherian ReinWei-Chen  Lin M.D.   On: 10/02/2017 21:10    Procedures Procedures (including critical care time)  Medications Ordered in ED Medications  furosemide (LASIX) injection 40 mg (not administered)     Initial Impression / Assessment and Plan / ED Course  I have reviewed the triage vital signs and the nursing notes.  Pertinent labs & imaging results that were available during my care of the patient were reviewed by me and considered in my medical decision making (see chart for details).  Patient presents with shortness of breath but a history of CHF. Her BNP is mildly elevated. She remains hypoxic despite diuresis with Lasix.  Her workup is otherwise unremarkable. I've discussed the care with Dr. Julian ReilGardner who agrees to admit.  Final Clinical Impressions(s) / ED Diagnoses   Final diagnoses:  None    ED Discharge Orders    None       Geoffery Lyonselo, Vietta Bonifield, MD 10/03/17 [phone]

## 2017-10-03 NOTE — H&P (Signed)
History and Physical    Hannah KandBarbara Velis ZOX:[id number] DOB: [birth year] DOA: 10/02/2017  PCP: Burnis MedinFulbright, Virginia E, PA-C   I have briefly reviewed patients [facility].  Patient coming from: home  Chief Complaint: chest tightness, SOB and low oxygen saturation.  HPI: Hannah Colon is a 61 y/o with PMH significant for diastolic HF, Hypertensive heart disease, HLD and rhinitis; who came to ED complaining of increase SOB, oxygen desaturation and chest tightness/discomfort. Patient reported symptoms has been present for the last 3-4 days and worsening. Patient reported mid chest discomfort, intermittent, non radiated, dull in nature and worse with exertion and laying flat. Patient expressed checking her O2 sat at home and noticing to be in the low 80's range; came to ED for further evaluation and treatment.  She denies productive, cough, fever, chills, nausea, vomiting, abd pain, dysuria, hematuria, focal weakness or any other complaints.   Of note, she contacted her cardiologist office on Friday (09/30/17)with of SOB and increase in her wight and was told to take extra doses of lasix, but not fast improvement acquired.  ED Course: CXR with mild vascular congestion, no acute infiltrates, mild BNP elevation and O2 sat in mid 80's on RA. Neg troponin, no acute ischemic changes on EKG. Patient received IV lasix, NTG paste and TRH was called to admit patient for further evaluation and treatment.   Review of Systems:  All other systems reviewed and apart from HPI, are negative.  Past Medical History:  Diagnosis Date  . Environmental and seasonal allergies   . High cholesterol   . Hypertension   . Hypertensive heart disease 05/13/2017    Past Surgical History:  Procedure Laterality Date  . ABDOMINAL HYSTERECTOMY    . SHOULDER ARTHROSCOPY Right     Social History  reports that  has never smoked. she has never used smokeless tobacco. She reports that she  drinks about 2.4 - 3.0 oz of alcohol per week. She reports that she does not use drugs.  Allergies  Allergen Reactions  . Peanut Oil Swelling  . Penicillins Hives and Rash    Has patient had a PCN reaction causing immediate rash, facial/tongue/throat swelling, SOB or lightheadedness with hypotension: No Has patient had a PCN reaction causing severe rash involving mucus membranes or skin necrosis: No Has patient had a PCN reaction that required hospitalization: No Has patient had a PCN reaction occurring within the last 10 years: Yes If all of the above answers are "NO", then may proceed with Cephalosporin use.     Family History  Problem Relation Age of Onset  . Dementia Mother   . Asthma Maternal Grandmother   . Heart failure Maternal Uncle      Prior to Admission medications   Medication Sig Start Date End Date Taking? Authorizing Provider  acetaminophen (TYLENOL) 500 MG tablet Take 1,000 mg by mouth every 6 (six) hours as needed for mild pain, moderate pain or headache.   Yes [provider]  aspirin EC 81 MG EC tablet Take 1 tablet (81 mg total) by mouth daily. 05/14/17  Yes Elgergawy, Leana Roeawood S, MD  atorvastatin (LIPITOR) 20 MG tablet Take 20 mg by mouth daily after breakfast.    Yes [provider]  azelastine (ASTELIN) 0.1 % nasal spray Place 1 spray into both nostrils 2 (two) times daily.   Yes [provider]  fluticasone (FLONASE) 50 MCG/ACT nasal spray Place 1 spray into both nostrils daily.   Yes [provider]  furosemide (LASIX) 20 MG tablet Take 1 tablet (20 mg total) by mouth daily. 06/15/17 10/03/17 Yes Chilton Si, MD  losartan (COZAAR) 50 MG tablet Take 50 mg by mouth daily after breakfast.  09/16/17  Yes [provider]  metoprolol tartrate (LOPRESSOR) 50 MG tablet Take 1 tablet (50 mg total) by mouth 2 (two) times daily. 09/30/17  Yes Kilroy, Eda Paschal, PA-C  Multiple Vitamin (MULTIVITAMIN WITH MINERALS) TABS tablet  Take 1 tablet by mouth daily.   Yes [provider]  VOLTAREN 1 % GEL Apply 1 application topically daily as needed (hand pain).  07/26/17  Yes [provider]    Physical Exam: Vitals:   10/03/17 0506 10/03/17 0543 10/03/17 0724 10/03/17 0906  BP: (!) 100/57 106/63 (!) 117/57   Pulse: 88 92 96   Resp: (!) 22  18   Temp:   98.2 F (36.8 C)   TempSrc:   Oral   SpO2: 94% 94% 97%   Weight:    60.4 kg (133 lb 1.6 oz)  Height:    5' (1.524 m)    Constitutional: afebrile, currently CP free, no nausea, no vomiting, still SOB and expressing mild orthopnea.  Eyes: PERTLA, lids and conjunctivae normal, no icterus. ENMT: Mucous membranes are moist. Posterior pharynx clear of any exudate or lesions. Normal dentition.  Neck: supple, no masses, no thyromegaly, mild JVD on exam Respiratory: good air movement, except for decrease BS at bases and fine crackles. no wheezing. No accessory muscle use. Wearing 2L [place] supplementation  Cardiovascular: S1 & S2 heard, regular rate and rhythm, loud holosystolic murmurs; no rubs, no gallops. Trace Lower extremity edema. 2+ pedal pulses. No carotid bruits.  Abdomen: No distension, no tenderness, no masses palpated. No hepatosplenomegaly. Bowel sounds normal.  Musculoskeletal: no clubbing / cyanosis. No joint deformity upper and lower extremities. Good ROM, no contractures. Normal muscle tone.  Skin: no rashes, lesions, ulcers. No induration Neurologic: CN 2-12 grossly intact. Sensation intact, DTR normal. Strength 5/5 in all 4 limbs.  Psychiatric: Normal judgment and insight. Alert and oriented x 3. Normal mood.     Labs on Admission: I have personally reviewed following labs and imaging studies  CBC: Recent Labs  Lab 10/02/17 2229  WBC 16.0*  HGB 10.8*  HCT 32.2*  MCV 89.0  PLT 418*   Basic Metabolic Panel: Recent Labs  Lab 09/30/17 0940 10/02/17 2229  NA 140 135  K 4.4 3.6  CL 100 100*  CO2 22 26  GLUCOSE 88 119*  BUN 15  16  CREATININE 0.65 0.81  CALCIUM 10.2 9.5   Radiological Exams on Admission: Dg Chest 2 View  Result Date: 10/02/2017 CLINICAL DATA:  Central chest pain and shortness of breath EXAM: CHEST  2 VIEW COMPARISON:  May 21, 2017 FINDINGS: The heart size and mediastinal contours are within normal limits. Both lungs are clear. Chronic compression deformity of a lower thoracic vertebral bodies unchanged. IMPRESSION: No active cardiopulmonary disease. Electronically Signed   By: Sherian Rein M.D.   On: 10/02/2017 21:10    EKG:  No acute ischemic changes appreciated, normal axis, sinus rhythm.    Assessment/Plan 1-Acute resp failure due to acute on chronic diastolic CHF (congestive heart failure) (HCC) -recent Echo done in August/2018; demonstrated EF 65-70% and moderate concentric hypertrophy. -patient started on IV lasix -will follow daily weights, strict intake and output, and low sodium diet -close follow up of electrolytes, renal function and clinical response  -continue b-blocker and ARB -education provided  about her condition and need for compliance with low sodium diet and medications.  2-Hyperlipidemia -continue Lipitor  3-Hyperglycemia -no prior hx of diabetes -will check A1C -repeat CBG's in am  4-Hypertensive heart disease -continue ARB and B-blocker -lasix will also help with BP control -PRN hydralazine ordered   5-rhinitis -will continue Flonase and Astelin nasal spray  6-heart murmur -continue outpatient follow up with cardiology service   7-GERD -continue Pepcid    Time: 65 minutes   DVT prophylaxis: Lovenox  Code Status: Full  Family Communication: no family at bedside   Disposition Plan: home once medically stable.  Consults called: none  Admission status:  Inpatient, LOS > 2 midnights, Telemetry bed.   Vassie Lollarlos Tyreece Gelles MD [facility] Pager [phone]  If 7PM-7AM, please contact night-coverage [email or web address] Password [email or web address]  10/03/2017,  11:10 AM

## 2017-10-04 DIAGNOSIS — R739 Hyperglycemia, unspecified: Secondary | ICD-10-CM | POA: Diagnosis not present

## 2017-10-04 DIAGNOSIS — E785 Hyperlipidemia, unspecified: Secondary | ICD-10-CM

## 2017-10-04 DIAGNOSIS — I110 Hypertensive heart disease with heart failure: Secondary | ICD-10-CM | POA: Diagnosis not present

## 2017-10-04 DIAGNOSIS — I5033 Acute on chronic diastolic (congestive) heart failure: Secondary | ICD-10-CM | POA: Diagnosis not present

## 2017-10-04 DIAGNOSIS — I5032 Chronic diastolic (congestive) heart failure: Secondary | ICD-10-CM | POA: Diagnosis not present

## 2017-10-04 LAB — BASIC METABOLIC PANEL
ANION GAP: 9 (ref 5–15)
BUN: 19 mg/dL (ref 6–20)
CHLORIDE: 100 mmol/L — AB (ref 101–111)
CO2: 28 mmol/L (ref 22–32)
Calcium: 9.6 mg/dL (ref 8.9–10.3)
Creatinine, Ser: 0.7 mg/dL (ref 0.44–1.00)
GFR calc Af Amer: >60 mL/min (ref >60)
GFR calc non Af Amer: >60 mL/min (ref >60)
GLUCOSE: 109 mg/dL — AB (ref 65–99)
POTASSIUM: 3.8 mmol/L (ref 3.5–5.1)
Sodium: 137 mmol/L (ref 135–145)

## 2017-10-04 MED ORDER — FUROSEMIDE 40 MG PO TABS
40.0000 mg | ORAL_TABLET | Freq: Every day | ORAL | Status: DC
  Administered 2017-10-04: 40 mg via ORAL
  Filled 2017-10-04: qty 1

## 2017-10-04 MED ORDER — LOSARTAN POTASSIUM 25 MG PO TABS: 25.0000 mg | ORAL_TABLET | Freq: Every day | ORAL | 11 refills | Status: DC

## 2017-10-04 MED ORDER — POTASSIUM CHLORIDE ER 10 MEQ PO TBCR: 10.0000 meq | EXTENDED_RELEASE_TABLET | Freq: Every day | ORAL | 2 refills | Status: AC

## 2017-10-04 MED ORDER — FUROSEMIDE 40 MG PO TABS: 40.0000 mg | ORAL_TABLET | Freq: Every day | ORAL | 2 refills | Status: DC

## 2017-10-04 NOTE — Discharge Summary (Signed)
Physician Discharge Summary  Hannah Colon VOZ:[id number] DOB: 27-Feb-1956 DOA: 10/02/2017  PCP: Burnis Medin, PA-C  Admit date: 10/02/2017 Discharge date: 10/04/2017  Time spent: 35* minutes  Recommendations for Outpatient Follow-up:  1. Follow up PCP in 2 weeks 2. Follow up Cardiology in one week   Discharge Diagnoses:  Principal Problem:   Acute on chronic diastolic CHF (congestive heart failure) (HCC) Active Problems:   Acute respiratory failure with hypoxia (HCC)   Hyperlipidemia   Hyperglycemia   Hypertensive heart disease   Discharge Condition: Stable  Diet recommendation: Low salt diet  Filed Weights   10/03/17 0906 10/04/17 0607  Weight: 60.4 kg (133 lb 1.6 oz) 60.7 kg (133 lb 14.4 oz)    History of present illness:   62 y/o with PMH significant for diastolic HF, Hypertensive heart disease, HLD and rhinitis; who came to ED complaining of increase SOB, oxygen desaturation and chest tightness/discomfort. Patient reported symptoms has been present for the last 3-4 days and worsening. Patient reported mid chest discomfort, intermittent, non radiated, dull in nature and worse with exertion and laying flat. Patient expressed checking her O2 sat at home and noticing to be in the low 80's range; came to ED for further evaluation and treatment.  She denies productive, cough, fever, chills, nausea, vomiting, abd pain, dysuria, hematuria, focal weakness or any other complaints.   Of note, she contacted her cardiologist office on Friday (09/30/17)with of SOB and increase in her wight and was told to take extra doses of lasix, but not fast improvement acquired.    Hospital Course:   Acute respiratory failure due to acute on chronic diastolic CHF-result, patient is now on room air O2 sats 95% , improved after IV Lasix .Recent Echo done in August/2018; demonstrated EF 65-70% and moderate concentric hypertrophy. Grade 1 diastolic dysfunction.   Acute on chronic diastolic  CHF-resolved, dose of Lasix will be changed to 40 mg PO daily. Continue metoprolol 50 mg PO BID, will change the dose of losartan 25 mg PO daily. Will also add 10 meq of potassium chloride tablets daily along with Lasix  Hyperlipidemia continue Lipitor  Hyperglycemia  resolved hemoglobin A1c is 5.5.  Hypertensive heart disease continue losartan, metoprolol  Heart murmur/mitral regurgitation followed by cardiology as outpatient   Procedures:  none  Consultations:  none  Discharge Exam: Vitals:   10/04/17 0607 10/04/17 0902  BP: (!) 104/54 112/66  Pulse: 86 93  Resp: 16 18  Temp: 98.2 F (36.8 C)   SpO2: 96% 95%    General: appears in no acute distress Cardiovascular: S1 S2, regular grade 3/6 systolic murmur at the mitral, and aortic area Respiratory: clear to auscultation bilaterally  Discharge Instructions   Discharge Instructions    Diet - low sodium heart healthy   Complete by:  As directed    Increase activity slowly   Complete by:  As directed      Allergies as of 10/04/2017      Reactions   Peanut Oil Swelling   Penicillins Hives, Rash   Has patient had a PCN reaction causing immediate rash, facial/tongue/throat swelling, SOB or lightheadedness with hypotension: No Has patient had a PCN reaction causing severe rash involving mucus membranes or skin necrosis: No Has patient had a PCN reaction that required hospitalization: No Has patient had a PCN reaction occurring within the last 10 years: Yes If all of the above answers are "NO", then may proceed with Cephalosporin use.      Medication List  TAKE these medications   acetaminophen 500 MG tablet Commonly known as:  TYLENOL Take 1,000 mg by mouth every 6 (six) hours as needed for mild pain, moderate pain or headache.   aspirin 81 MG EC tablet Take 1 tablet (81 mg total) by mouth daily.   atorvastatin 20 MG tablet Commonly known as:  LIPITOR Take 20 mg by mouth daily after breakfast.    azelastine 0.1 % nasal spray Commonly known as:  ASTELIN Place 1 spray into both nostrils 2 (two) times daily.   fluticasone 50 MCG/ACT nasal spray Commonly known as:  FLONASE Place 1 spray into both nostrils daily.   furosemide 40 MG tablet Commonly known as:  LASIX Take 1 tablet (40 mg total) by mouth daily. What changed:    medication strength  how much to take   losartan 25 MG tablet Commonly known as:  COZAAR Take 1 tablet (25 mg total) by mouth daily. What changed:    medication strength  how much to take  when to take this   metoprolol tartrate 50 MG tablet Commonly known as:  LOPRESSOR Take 1 tablet (50 mg total) by mouth 2 (two) times daily.   multivitamin with minerals Tabs tablet Take 1 tablet by mouth daily.   potassium chloride 10 MEQ tablet Commonly known as:  K-DUR Take 1 tablet (10 mEq total) by mouth daily. Take along with lasix   VOLTAREN 1 % Gel Generic drug:  diclofenac sodium Apply 1 application topically daily as needed (hand pain).      Allergies  Allergen Reactions  . Peanut Oil Swelling  . Penicillins Hives and Rash    Has patient had a PCN reaction causing immediate rash, facial/tongue/throat swelling, SOB or lightheadedness with hypotension: No Has patient had a PCN reaction causing severe rash involving mucus membranes or skin necrosis: No Has patient had a PCN reaction that required hospitalization: No Has patient had a PCN reaction occurring within the last 10 years: Yes If all of the above answers are "NO", then may proceed with Cephalosporin use.    Follow-up Information    Chilton Siandolph, Tiffany, MD. Schedule an appointment as soon as possible for a visit in 1 week(s).   Specialty:  Cardiology Contact information: [address] [place] [address] [phone]            The results of significant diagnostics from this hospitalization (including imaging, microbiology, ancillary and laboratory) are  listed below for reference.    Significant Diagnostic Studies: Dg Chest 2 View  Result Date: 10/02/2017 CLINICAL DATA:  Central chest pain and shortness of breath EXAM: CHEST  2 VIEW COMPARISON:  May 21, 2017 FINDINGS: The heart size and mediastinal contours are within normal limits. Both lungs are clear. Chronic compression deformity of a lower thoracic vertebral bodies unchanged. IMPRESSION: No active cardiopulmonary disease. Electronically Signed   By: Sherian ReinWei-Chen  Lin M.D.   On: 10/02/2017 21:10    Microbiology: No results found for this or any previous visit (from the past 240 hour(s)).   Labs: Basic Metabolic Panel: Recent Labs  Lab 09/30/17 0940 10/02/17 2229 10/03/17 1113 10/04/17 0515  NA 140 135  --  137  K 4.4 3.6  --  3.8  CL 100 100*  --  100*  CO2 22 26  --  28  GLUCOSE 88 119*  --  109*  BUN 15 16  --  19  CREATININE 0.65 0.81 0.65 0.70  CALCIUM 10.2 9.5  --  9.6  MG  --   --  1.9  --    Liver Function Tests: No results for input(s): AST, ALT, ALKPHOS, BILITOT, PROT, ALBUMIN in the last 168 hours. No results for input(s): LIPASE, AMYLASE in the last 168 hours. No results for input(s): AMMONIA in the last 168 hours. CBC: Recent Labs  Lab 10/02/17 2229 10/03/17 1113  WBC 16.0* 9.6  HGB 10.8* 10.7*  HCT 32.2* 32.1*  MCV 89.0 89.2  PLT 418* 389   Cardiac Enzymes: No results for input(s): CKTOTAL, CKMB, CKMBINDEX, TROPONINI in the last 168 hours. BNP: BNP (last 3 results) Recent Labs    05/22/17 0033 09/30/17 0940 10/03/17 0026  BNP 78.1 125.8* 162.9*        Signed:  Meredeth Ide MD.  [facility] 10/04/2017, 10:45 AM

## 2017-10-04 NOTE — Progress Notes (Signed)
Spoke with pt concerning CHF program and HH needs. Pt states that she have an appointment with PCP on Friday and Heart(cardiologist) on Monday. Pt continued to state there are not HH needs at present time.

## 2017-10-05 ENCOUNTER — Encounter: Payer: Self-pay | Admitting: Cardiovascular Disease

## 2017-10-06 NOTE — Addendum Note (Signed)
Addended by: Lorain ChildesPRESSLEY, Amara Justen A on: 10/06/2017 09:15 AM   Modules accepted: Orders

## 2017-10-07 ENCOUNTER — Inpatient Hospital Stay (HOSPITAL_COMMUNITY)
Admission: EM | Admit: 2017-10-07 | Discharge: 2017-10-13 | DRG: 291 | Disposition: A | Discharge status: Home Health | Payer: 59 | Attending: Internal Medicine | Admitting: Internal Medicine

## 2017-10-07 ENCOUNTER — Other Ambulatory Visit: Payer: Self-pay

## 2017-10-07 ENCOUNTER — Encounter (HOSPITAL_COMMUNITY): Payer: Self-pay

## 2017-10-07 ENCOUNTER — Ambulatory Visit: Payer: 59 | Admitting: Adult Health

## 2017-10-07 ENCOUNTER — Emergency Department (HOSPITAL_COMMUNITY): Payer: 59

## 2017-10-07 ENCOUNTER — Inpatient Hospital Stay (HOSPITAL_COMMUNITY): Payer: 59

## 2017-10-07 ENCOUNTER — Ambulatory Visit: Payer: 59 | Admitting: Cardiology

## 2017-10-07 DIAGNOSIS — J9601 Acute respiratory failure with hypoxia: Secondary | ICD-10-CM

## 2017-10-07 DIAGNOSIS — Z90710 Acquired absence of both cervix and uterus: Secondary | ICD-10-CM | POA: Diagnosis not present

## 2017-10-07 DIAGNOSIS — D649 Anemia, unspecified: Secondary | ICD-10-CM

## 2017-10-07 DIAGNOSIS — Y92230 Patient room in hospital as the place of occurrence of the external cause: Secondary | ICD-10-CM | POA: Diagnosis not present

## 2017-10-07 DIAGNOSIS — D638 Anemia in other chronic diseases classified elsewhere: Secondary | ICD-10-CM | POA: Diagnosis present

## 2017-10-07 DIAGNOSIS — R579 Shock, unspecified: Secondary | ICD-10-CM | POA: Diagnosis not present

## 2017-10-07 DIAGNOSIS — R578 Other shock: Secondary | ICD-10-CM | POA: Diagnosis not present

## 2017-10-07 DIAGNOSIS — R0902 Hypoxemia: Secondary | ICD-10-CM | POA: Diagnosis present

## 2017-10-07 DIAGNOSIS — E7800 Pure hypercholesterolemia, unspecified: Secondary | ICD-10-CM | POA: Diagnosis present

## 2017-10-07 DIAGNOSIS — K5900 Constipation, unspecified: Secondary | ICD-10-CM | POA: Diagnosis not present

## 2017-10-07 DIAGNOSIS — I340 Nonrheumatic mitral (valve) insufficiency: Secondary | ICD-10-CM | POA: Diagnosis present

## 2017-10-07 DIAGNOSIS — I10 Essential (primary) hypertension: Secondary | ICD-10-CM | POA: Diagnosis not present

## 2017-10-07 DIAGNOSIS — R011 Cardiac murmur, unspecified: Secondary | ICD-10-CM | POA: Diagnosis present

## 2017-10-07 DIAGNOSIS — Z880 Allergy status to penicillin: Secondary | ICD-10-CM | POA: Diagnosis not present

## 2017-10-07 DIAGNOSIS — D509 Iron deficiency anemia, unspecified: Secondary | ICD-10-CM | POA: Diagnosis present

## 2017-10-07 DIAGNOSIS — E785 Hyperlipidemia, unspecified: Secondary | ICD-10-CM | POA: Diagnosis present

## 2017-10-07 DIAGNOSIS — Z825 Family history of asthma and other chronic lower respiratory diseases: Secondary | ICD-10-CM | POA: Diagnosis not present

## 2017-10-07 DIAGNOSIS — R079 Chest pain, unspecified: Secondary | ICD-10-CM | POA: Diagnosis present

## 2017-10-07 DIAGNOSIS — I9589 Other hypotension: Secondary | ICD-10-CM | POA: Diagnosis not present

## 2017-10-07 DIAGNOSIS — J849 Interstitial pulmonary disease, unspecified: Secondary | ICD-10-CM

## 2017-10-07 DIAGNOSIS — Z91041 Radiographic dye allergy status: Secondary | ICD-10-CM | POA: Diagnosis not present

## 2017-10-07 DIAGNOSIS — T502X5A Adverse effect of carbonic-anhydrase inhibitors, benzothiadiazides and other diuretics, initial encounter: Secondary | ICD-10-CM | POA: Diagnosis not present

## 2017-10-07 DIAGNOSIS — Z79899 Other long term (current) drug therapy: Secondary | ICD-10-CM | POA: Diagnosis not present

## 2017-10-07 DIAGNOSIS — I5033 Acute on chronic diastolic (congestive) heart failure: Secondary | ICD-10-CM | POA: Diagnosis present

## 2017-10-07 DIAGNOSIS — Z91018 Allergy to other foods: Secondary | ICD-10-CM | POA: Diagnosis not present

## 2017-10-07 DIAGNOSIS — Z8249 Family history of ischemic heart disease and other diseases of the circulatory system: Secondary | ICD-10-CM | POA: Diagnosis not present

## 2017-10-07 DIAGNOSIS — J302 Other seasonal allergic rhinitis: Secondary | ICD-10-CM | POA: Diagnosis present

## 2017-10-07 DIAGNOSIS — I5030 Unspecified diastolic (congestive) heart failure: Secondary | ICD-10-CM | POA: Diagnosis not present

## 2017-10-07 DIAGNOSIS — J9620 Acute and chronic respiratory failure, unspecified whether with hypoxia or hypercapnia: Secondary | ICD-10-CM | POA: Diagnosis present

## 2017-10-07 DIAGNOSIS — Z7982 Long term (current) use of aspirin: Secondary | ICD-10-CM | POA: Diagnosis not present

## 2017-10-07 DIAGNOSIS — I421 Obstructive hypertrophic cardiomyopathy: Secondary | ICD-10-CM | POA: Diagnosis present

## 2017-10-07 DIAGNOSIS — I959 Hypotension, unspecified: Secondary | ICD-10-CM | POA: Diagnosis present

## 2017-10-07 DIAGNOSIS — J9621 Acute and chronic respiratory failure with hypoxia: Secondary | ICD-10-CM | POA: Diagnosis present

## 2017-10-07 DIAGNOSIS — I110 Hypertensive heart disease with heart failure: Secondary | ICD-10-CM | POA: Diagnosis present

## 2017-10-07 DIAGNOSIS — J9600 Acute respiratory failure, unspecified whether with hypoxia or hypercapnia: Secondary | ICD-10-CM

## 2017-10-07 LAB — I-STAT BETA HCG BLOOD, ED (MC, WL, AP ONLY)

## 2017-10-07 LAB — COOXEMETRY PANEL
Carboxyhemoglobin: 1.1 % (ref 0.5–1.5)
Methemoglobin: 1 % (ref 0.0–1.5)
O2 Saturation: 77.2 %
TOTAL HEMOGLOBIN: 10.1 g/dL — AB (ref 12.0–16.0)

## 2017-10-07 LAB — BASIC METABOLIC PANEL
Anion gap: 8 (ref 5–15)
BUN: 15 mg/dL (ref 6–20)
CHLORIDE: 101 mmol/L (ref 101–111)
CO2: 23 mmol/L (ref 22–32)
Calcium: 9.2 mg/dL (ref 8.9–10.3)
Creatinine, Ser: 0.61 mg/dL (ref 0.44–1.00)
GFR calc Af Amer: >60 mL/min (ref >60)
GFR calc non Af Amer: >60 mL/min (ref >60)
GLUCOSE: 162 mg/dL — AB (ref 65–99)
POTASSIUM: 3.7 mmol/L (ref 3.5–5.1)
Sodium: 132 mmol/L — ABNORMAL LOW (ref 135–145)

## 2017-10-07 LAB — CBG MONITORING, ED: Glucose-Capillary: 180 mg/dL — ABNORMAL HIGH (ref 65–99)

## 2017-10-07 LAB — CBC
HEMATOCRIT: 32 % — AB (ref 36.0–46.0)
Hemoglobin: 10.6 g/dL — ABNORMAL LOW (ref 12.0–15.0)
MCH: 29.4 pg (ref 26.0–34.0)
MCHC: 33.1 g/dL (ref 30.0–36.0)
MCV: 88.6 fL (ref 78.0–100.0)
Platelets: 386 10*3/uL (ref 150–400)
RBC: 3.61 MIL/uL — ABNORMAL LOW (ref 3.87–5.11)
RDW: 14.4 % (ref 11.5–15.5)
WBC: 25.2 10*3/uL — AB (ref 4.0–10.5)

## 2017-10-07 LAB — I-STAT TROPONIN, ED
TROPONIN I, POC: 0 ng/mL (ref 0.00–0.08)
Troponin i, poc: 0 ng/mL (ref 0.00–0.08)

## 2017-10-07 LAB — INFLUENZA PANEL BY PCR (TYPE A & B)
Influenza A By PCR: NEGATIVE
Influenza B By PCR: NEGATIVE

## 2017-10-07 LAB — LACTIC ACID, PLASMA
Lactic Acid, Venous: 2.7 mmol/L (ref 0.5–1.9)
Lactic Acid, Venous: 1.2 mmol/L (ref 0.5–1.9)

## 2017-10-07 LAB — TROPONIN I
Troponin I: <0.03 ng/mL (ref <0.03)
Troponin I: 0.04 ng/mL (ref <0.03)

## 2017-10-07 LAB — I-STAT CG4 LACTIC ACID, ED: Lactic Acid, Venous: 1.11 mmol/L (ref 0.5–1.9)

## 2017-10-07 LAB — PROCALCITONIN: Procalcitonin: 0.13 ng/mL

## 2017-10-07 LAB — BRAIN NATRIURETIC PEPTIDE: B Natriuretic Peptide: 98.8 pg/mL (ref 0.0–100.0)

## 2017-10-07 MED ORDER — FLUTICASONE PROPIONATE 50 MCG/ACT NA SUSP
1.0000 | Freq: Every day | NASAL | Status: DC
  Administered 2017-10-08 – 2017-10-13 (×6): 1 via NASAL
  Filled 2017-10-07: qty 16

## 2017-10-07 MED ORDER — VANCOMYCIN HCL IN DEXTROSE 1-5 GM/200ML-% IV SOLN
1000.0000 mg | Freq: Once | INTRAVENOUS | Status: AC
  Administered 2017-10-07: 1000 mg via INTRAVENOUS
  Filled 2017-10-07: qty 200

## 2017-10-07 MED ORDER — DEXTROSE 5 % IV SOLN
2.0000 g | Freq: Once | INTRAVENOUS | Status: AC
  Administered 2017-10-07: 2 g via INTRAVENOUS
  Filled 2017-10-07: qty 2

## 2017-10-07 MED ORDER — GUAIFENESIN-CODEINE 100-10 MG/5ML PO SOLN
5.0000 mL | ORAL | Status: DC | PRN
  Administered 2017-10-07 – 2017-10-08 (×2): 5 mL via ORAL
  Filled 2017-10-07 (×2): qty 5

## 2017-10-07 MED ORDER — METHYLPREDNISOLONE SODIUM SUCC 125 MG IJ SOLR
INTRAMUSCULAR | Status: AC
  Filled 2017-10-07: qty 2

## 2017-10-07 MED ORDER — HYDROCORTISONE NA SUCCINATE PF 100 MG IJ SOLR
50.0000 mg | Freq: Four times a day (QID) | INTRAMUSCULAR | Status: DC
  Administered 2017-10-08 – 2017-10-09 (×7): 50 mg via INTRAVENOUS
  Filled 2017-10-07 (×7): qty 2

## 2017-10-07 MED ORDER — ACETAMINOPHEN 325 MG PO TABS
650.0000 mg | ORAL_TABLET | Freq: Four times a day (QID) | ORAL | Status: DC | PRN
  Administered 2017-10-09: 650 mg via ORAL
  Filled 2017-10-07: qty 2

## 2017-10-07 MED ORDER — ONDANSETRON HCL 4 MG/2ML IJ SOLN: 4.0000 mg | Freq: Four times a day (QID) | INTRAMUSCULAR | Status: DC | PRN

## 2017-10-07 MED ORDER — DEXTROSE 5 % IV SOLN
500.0000 mg | Freq: Every day | INTRAVENOUS | Status: DC
  Administered 2017-10-07 – 2017-10-09 (×3): 500 mg via INTRAVENOUS
  Filled 2017-10-07 (×3): qty 500

## 2017-10-07 MED ORDER — ASPIRIN EC 81 MG PO TBEC
81.0000 mg | DELAYED_RELEASE_TABLET | Freq: Every day | ORAL | Status: DC
  Administered 2017-10-08 – 2017-10-13 (×6): 81 mg via ORAL
  Filled 2017-10-07 (×6): qty 1

## 2017-10-07 MED ORDER — IOPAMIDOL (ISOVUE-370) INJECTION 76%
INTRAVENOUS | Status: AC
  Administered 2017-10-07: 100 mL via INTRAVENOUS
  Filled 2017-10-07: qty 100

## 2017-10-07 MED ORDER — SODIUM CHLORIDE 0.9 % IV BOLUS (SEPSIS)
1000.0000 mL | Freq: Once | INTRAVENOUS | Status: AC
  Administered 2017-10-07: 1000 mL via INTRAVENOUS

## 2017-10-07 MED ORDER — EPINEPHRINE 0.3 MG/0.3ML IJ SOAJ
INTRAMUSCULAR | Status: AC
  Filled 2017-10-07: qty 0.3

## 2017-10-07 MED ORDER — METHYLPREDNISOLONE SODIUM SUCC 125 MG IJ SOLR
60.0000 mg | Freq: Once | INTRAMUSCULAR | Status: AC
  Administered 2017-10-07: 60 mg via INTRAVENOUS

## 2017-10-07 MED ORDER — BENZONATATE 100 MG PO CAPS: 100.0000 mg | ORAL_CAPSULE | Freq: Three times a day (TID) | ORAL | Status: DC | PRN

## 2017-10-07 MED ORDER — ACETAMINOPHEN 650 MG RE SUPP: 650.0000 mg | Freq: Four times a day (QID) | RECTAL | Status: DC | PRN

## 2017-10-07 MED ORDER — IPRATROPIUM-ALBUTEROL 0.5-2.5 (3) MG/3ML IN SOLN
3.0000 mL | Freq: Four times a day (QID) | RESPIRATORY_TRACT | Status: DC
  Filled 2017-10-07 (×2): qty 3

## 2017-10-07 MED ORDER — ONDANSETRON HCL 4 MG/2ML IJ SOLN
4.0000 mg | Freq: Once | INTRAMUSCULAR | Status: AC
  Administered 2017-10-07: 4 mg via INTRAVENOUS
  Filled 2017-10-07: qty 2

## 2017-10-07 MED ORDER — IPRATROPIUM-ALBUTEROL 0.5-2.5 (3) MG/3ML IN SOLN
3.0000 mL | Freq: Once | RESPIRATORY_TRACT | Status: AC
  Administered 2017-10-07: 3 mL via RESPIRATORY_TRACT
  Filled 2017-10-07: qty 3

## 2017-10-07 MED ORDER — METHYLPREDNISOLONE SODIUM SUCC 125 MG IJ SOLR: 60.0000 mg | Freq: Four times a day (QID) | INTRAMUSCULAR | Status: DC

## 2017-10-07 MED ORDER — DEXTROSE 5 % IV SOLN
1.0000 g | Freq: Three times a day (TID) | INTRAVENOUS | Status: DC
  Administered 2017-10-08 – 2017-10-10 (×8): 1 g via INTRAVENOUS
  Filled 2017-10-07 (×9): qty 1

## 2017-10-07 MED ORDER — AZELASTINE HCL 0.1 % NA SOLN
1.0000 | Freq: Two times a day (BID) | NASAL | Status: DC
  Administered 2017-10-08 – 2017-10-13 (×11): 1 via NASAL
  Filled 2017-10-07: qty 30

## 2017-10-07 MED ORDER — VANCOMYCIN HCL 500 MG IV SOLR
500.0000 mg | Freq: Two times a day (BID) | INTRAVENOUS | Status: DC
  Administered 2017-10-08: 500 mg via INTRAVENOUS
  Filled 2017-10-07: qty 500

## 2017-10-07 MED ORDER — ASPIRIN 325 MG PO TABS
325.0000 mg | ORAL_TABLET | Freq: Every day | ORAL | Status: DC
  Administered 2017-10-07: 325 mg via ORAL
  Filled 2017-10-07: qty 1

## 2017-10-07 MED ORDER — HEPARIN SODIUM (PORCINE) 5000 UNIT/ML IJ SOLN
5000.0000 [IU] | Freq: Three times a day (TID) | INTRAMUSCULAR | Status: DC
  Administered 2017-10-07 – 2017-10-13 (×17): 5000 [IU] via SUBCUTANEOUS
  Filled 2017-10-07 (×17): qty 1

## 2017-10-07 MED ORDER — DEXTROSE 5 % IV SOLN
0.0000 ug/min | INTRAVENOUS | Status: DC
  Administered 2017-10-07: 2 ug/min via INTRAVENOUS
  Filled 2017-10-07 (×2): qty 4

## 2017-10-07 MED ORDER — EPINEPHRINE 0.3 MG/0.3ML IJ SOAJ
0.3000 mg | Freq: Once | INTRAMUSCULAR | Status: AC
  Administered 2017-10-07: 0.3 mg via INTRAMUSCULAR

## 2017-10-07 MED ORDER — SODIUM CHLORIDE 0.9 % IV SOLN: 250.0000 mL | INTRAVENOUS | Status: DC | PRN

## 2017-10-07 MED ORDER — ATORVASTATIN CALCIUM 20 MG PO TABS
20.0000 mg | ORAL_TABLET | Freq: Every day | ORAL | Status: DC
  Administered 2017-10-08 – 2017-10-13 (×6): 20 mg via ORAL
  Filled 2017-10-07 (×7): qty 1
  Filled 2017-10-07 (×2): qty 2

## 2017-10-07 MED ORDER — FUROSEMIDE 10 MG/ML IJ SOLN
40.0000 mg | Freq: Once | INTRAMUSCULAR | Status: AC
  Administered 2017-10-07: 40 mg via INTRAVENOUS
  Filled 2017-10-07: qty 4

## 2017-10-07 NOTE — Progress Notes (Deleted)
Cardiology Office Note   Date:  10/07/2017   ID:  Hannah Colon, DOB Dec 17, 1955, MRN [id number]  PCP:  Burnis Medin, PA-C  Cardiologist: Dr. Duke Salvia No chief complaint on file.    History of Present Illness: Hannah Colon is a 62 y.o. female who presents for ongoing assessment and management of hypertension, moderate LVH, elevated LVEDP, small LVOT gradient.  Not found to have HOCM.  This recent Myoview was negative and found to be low risk for coronary artery disease.  The patient was last seen in the office on 09/30/2017 by Corine Shelter, PA  On the last office visit the patient was asymptomatic, euvolemic, and no further testing was completed  Unfortunately, the patient was admitted to the hospital 3 days later with acute on chronic diastolic heart failure, acute respiratory failure.  The patient was treated with IV Lasix, and increased on dose to 40 mg p.o. daily.  She was continued on metoprolol p.o. twice daily and losartan 25 mg p.o. daily.  Potassium was added at 10 mEq daily due to increased dose of Lasix.  Past Medical History:  Diagnosis Date  . Environmental and seasonal allergies   . High cholesterol   . Hypertension   . Hypertensive heart disease 05/13/2017    Past Surgical History:  Procedure Laterality Date  . ABDOMINAL HYSTERECTOMY    . SHOULDER ARTHROSCOPY Right      Current Outpatient Medications  Medication Sig Dispense Refill  . acetaminophen (TYLENOL) 500 MG tablet Take 1,000 mg by mouth every 6 (six) hours as needed for mild pain, moderate pain or headache.    Marland Kitchen aspirin EC 81 MG EC tablet Take 1 tablet (81 mg total) by mouth daily. 30 tablet 0  . atorvastatin (LIPITOR) 20 MG tablet Take 20 mg by mouth daily after breakfast.     . azelastine (ASTELIN) 0.1 % nasal spray Place 1 spray into both nostrils 2 (two) times daily.    . fluticasone (FLONASE) 50 MCG/ACT nasal spray Place 1 spray into both nostrils daily.    . furosemide (LASIX) 40 MG tablet  Take 1 tablet (40 mg total) by mouth daily. 30 tablet 2  . losartan (COZAAR) 25 MG tablet Take 1 tablet (25 mg total) by mouth daily. 30 tablet 11  . metoprolol tartrate (LOPRESSOR) 50 MG tablet Take 1 tablet (50 mg total) by mouth 2 (two) times daily. 60 tablet 6  . Multiple Vitamin (MULTIVITAMIN WITH MINERALS) TABS tablet Take 1 tablet by mouth daily.    . potassium chloride (K-DUR) 10 MEQ tablet Take 1 tablet (10 mEq total) by mouth daily. Take along with lasix 30 tablet 2  . VOLTAREN 1 % GEL Apply 1 application topically daily as needed (hand pain).      No current facility-administered medications for this visit.     Allergies:   Peanut oil and Penicillins    Social History:  The patient  reports that  has never smoked. she has never used smokeless tobacco. She reports that she drinks about 2.4 - 3.0 oz of alcohol per week. She reports that she does not use drugs.   Family History:  The patient's family history includes Asthma in her maternal grandmother; Dementia in her mother; Heart failure in her maternal uncle.    ROS: All other systems are reviewed and negative. Unless otherwise mentioned in H&P    PHYSICAL EXAM: VS:  There were no vitals taken for this visit. , BMI There is no height or weight on  file to calculate BMI. GEN: Well nourished, well developed, in no acute distress  HEENT: normal  Neck: no JVD, carotid bruits, or masses Cardiac: ***RRR; no murmurs, rubs, or gallops,no edema  Respiratory:  clear to auscultation bilaterally, normal work of breathing GI: soft, nontender, nondistended, + BS MS: no deformity or atrophy  Skin: warm and dry, no rash Neuro:  Strength and sensation are intact Psych: euthymic mood, full affect   EKG:  EKG {ACTION; IS/IS [id number]} ordered today. The ekg ordered today demonstrates ***   Recent Labs: 10/03/2017: B Natriuretic Peptide 162.9; Hemoglobin 10.7; Magnesium 1.9; Platelets 389; TSH 2.477 10/04/2017: BUN 19; Creatinine, Ser  0.70; Potassium 3.8; Sodium 137    Lipid Panel    Component Value Date/Time   CHOL 173 05/13/2017 0121   TRIG 275 (H) 05/13/2017 0121   HDL 48 05/13/2017 0121   CHOLHDL 3.6 05/13/2017 0121   VLDL 55 (H) 05/13/2017 0121   LDLCALC 70 05/13/2017 0121      Wt Readings from Last 3 Encounters:  10/04/17 133 lb 14.4 oz (60.7 kg)  09/30/17 138 lb (62.6 kg)  08/10/17 138 lb (62.6 kg)      Other studies Reviewed: Additional studies/ records that were reviewed today include: ***. Review of the above records demonstrates: ***   ASSESSMENT AND PLAN:  1.  ***   Current medicines are reviewed at length with the patient today.    Labs/ tests ordered today include: *** Bettey MareKathryn M. Liborio NixonLawrence DNP, ANP, AACC   10/07/2017 8:04 AM    [facility] [address], [place], [place] [address] Phone: [phone]; Fax: [phone]

## 2017-10-07 NOTE — ED Triage Notes (Signed)
Patient was recently discharged from the hospital with CHF. Patient c/o mid chest pain and SOB since 0630 today.

## 2017-10-07 NOTE — ED Notes (Signed)
Hannah KussmaulKatalina Eubanks NP returned page and was notified of elevated lactic acid of 2.7

## 2017-10-07 NOTE — ED Notes (Signed)
Patient transported to CT 

## 2017-10-07 NOTE — Procedures (Signed)
Central Venous Catheter Insertion Procedure Note Hannah Colon [id number] 10-09-1955  Procedure: Insertion of Central Venous Catheter Indications: Assessment of intravascular volume and Drug and/or fluid administration  Procedure Details Consent: Risks of procedure as well as the alternatives and risks of each were explained to the (patient/caregiver).  Consent for procedure obtained. Time Out: Verified patient identification, verified procedure, site/side was marked, verified correct patient position, special equipment/implants available, medications/allergies/relevent history reviewed, required imaging and test results available.  Performed  Maximum sterile technique was used including antiseptics, cap, gloves, gown, hand hygiene, mask and sheet. Skin prep: Chlorhexidine; local anesthetic administered A antimicrobial bonded/coated triple lumen catheter was placed in the right internal jugular vein using the Seldinger technique.  Evaluation Blood flow good Complications: No apparent complications Patient did tolerate procedure well. Chest X-ray ordered to verify placement.  CXR: pending.  Hannah BalsamKristen D Colon 10/07/2017, 8:21 PM

## 2017-10-07 NOTE — ED Notes (Signed)
Notified Dr Lowell GuitarPowell that pt dropped her MAP 20 points in 1 hr. Pt moved to [facility] to be evaluated by Dr Lowell GuitarPowell and Dr Madilyn Hookees.

## 2017-10-07 NOTE — ED Notes (Addendum)
Jovita KussmaulKatalina Eubanks NP paged to notify her of pt's lactic acid 2.7

## 2017-10-07 NOTE — Telephone Encounter (Signed)
F/U appt:  Patient called to cancel her appt today with Joni ReiningKathryn Lawrence. [facility], due to SOB.

## 2017-10-07 NOTE — ED Notes (Signed)
RN went into room to assess patient that called out for worsening shortness of breath and chest pain. Patient noticed pain worsen after CT scan. MD notified and at bed to assess patient. Patient had episode of emesis and placed on non rebreather. EKG obtained, portable chest xray and troponin ordered.

## 2017-10-07 NOTE — ED Notes (Addendum)
Central line adjusted 25cm to 14.5

## 2017-10-07 NOTE — ED Notes (Signed)
Lab reports adding BNP onto previous draw.

## 2017-10-07 NOTE — Consult Note (Signed)
.. ..  Name: Hannah Colon MRN: [id number] DOB: 14-Jun-1956    ADMISSION DATE:  10/07/2017 CONSULTATION DATE:  10/07/17  REFERRING MD :  Elijah BirkALDWELL MD  CHIEF COMPLAINT:  SOB  BRIEF PATIENT DESCRIPTION: 62 yr old female with PMHx of HFpEF, HTN,HLD, presents with SOB, pleuritic chest pain, chills and cough. CT Chest negative for PE hypotensive after study requiring Levophed ggt.  SIGNIFICANT EVENTS  Started Levophed ggt  STUDIES:  CT PE study   HISTORY OF PRESENT ILLNESS:  62 yr old female with PMHx of HFpEF, HTN, HLD, who states that until a few weeks ago she was in her usual state of health. Then she reports eating some fried [place] with other indulgent foods and was seen by her Cardiologist and required increased doses of her medications especially her diuretics.  Per her chart she was seen in the Office on 12/28 and in the ED 12/30.  For the last couple of days she has had a cough with worsening chest discomfort, chills but denies fevers states that her pulse ox per her [employer] phone was 86-87% Sat ( it typically runs 3 pts below hospital pulse ox). She denies leg swelling but c/o orthopnea. Initially admitted to Hospitalist service. Key events:  Pt went for CT scan and when she returned she had worsening dyspnea, chest pain, emesis and desaturating. Her MAP dropped 20mmHg and patient was placed on NRB mask and started on  Levophed ggt via peripheral IV. PCCM consulted  PAST MEDICAL HISTORY :   has a past medical history of Diastolic heart failure (HCC), Environmental and seasonal allergies, High cholesterol, Hypertension, and Hypertensive heart disease (05/13/2017).  has a past surgical history that includes Abdominal hysterectomy and Shoulder arthroscopy (Right). Prior to Admission medications   Medication Sig Start Date End Date Taking? Authorizing Provider  acetaminophen (TYLENOL) 500 MG tablet Take 1,000 mg by mouth every 6 (six) hours as needed for mild pain, moderate pain or  headache.   Yes [provider]  aspirin EC 81 MG EC tablet Take 1 tablet (81 mg total) by mouth daily. 05/14/17  Yes Elgergawy, Leana Roeawood S, MD  atorvastatin (LIPITOR) 20 MG tablet Take 20 mg by mouth daily after breakfast.    Yes [provider]  azelastine (ASTELIN) 0.1 % nasal spray Place 1 spray into both nostrils 2 (two) times daily.   Yes [provider]  fluticasone (FLONASE) 50 MCG/ACT nasal spray Place 1 spray into both nostrils daily.   Yes [provider]  furosemide (LASIX) 40 MG tablet Take 1 tablet (40 mg total) by mouth daily. 10/04/17  Yes Meredeth IdeLama, Gagan S, MD  losartan (COZAAR) 25 MG tablet Take 1 tablet (25 mg total) by mouth daily. 10/04/17 10/04/18 Yes Meredeth IdeLama, Gagan S, MD  metoprolol tartrate (LOPRESSOR) 50 MG tablet Take 1 tablet (50 mg total) by mouth 2 (two) times daily. 09/30/17  Yes Kilroy, Eda PaschalLuke K, PA-C  Multiple Vitamin (MULTIVITAMIN WITH MINERALS) TABS tablet Take 1 tablet by mouth daily.   Yes [provider]  OVER THE COUNTER MEDICATION Place 3 drops under the tongue daily. Allergy drops made up at Dr office   Yes [provider]  potassium chloride (K-DUR) 10 MEQ tablet Take 1 tablet (10 mEq total) by mouth daily. Take along with lasix 10/04/17  Yes Lama, Sarina IllGagan S, MD  VOLTAREN 1 % GEL Apply 1 application topically daily as needed (hand pain).  07/26/17  Yes [provider]   Allergies  Allergen Reactions  .  Peanut Oil Swelling  . Contrast Media [Iodinated Diagnostic Agents]     hypotension  . Penicillins Hives and Rash    Has patient had a PCN reaction causing immediate rash, facial/tongue/throat swelling, SOB or lightheadedness with hypotension: No Has patient had a PCN reaction causing severe rash involving mucus membranes or skin necrosis: No Has patient had a PCN reaction that required hospitalization: No Has patient had a PCN reaction occurring within the last 10 years: Yes If all of the above answers are "NO",  then may proceed with Cephalosporin use.     FAMILY HISTORY:  family history includes Asthma in her maternal grandmother; Dementia in her mother; Heart failure in her maternal uncle. SOCIAL HISTORY:  reports that  has never smoked. she has never used smokeless tobacco. She reports that she drinks about 2.4 - 3.0 oz of alcohol per week. She reports that she does not use drugs.  REVIEW OF SYSTEMS:   Constitutional: Negative for fever, chills, weight loss, malaise/fatigue and diaphoresis. weakness  HENT: Negative for hearing loss, ear pain, nosebleeds, congestion, sore throat, neck pain, tinnitus and ear discharge.   Eyes: Negative for blurred vision, double vision, photophobia, pain, discharge and redness.  Respiratory: Negative for cough, hemoptysis, sputum production, shortness of breath, wheezing and stridor.   Cardiovascular: Negative for anterior chest pain, palpitations, orthopnea, claudication, leg swelling and PND. reproducible chest pain on palpation Gastrointestinal: Negative for heartburn, nausea, vomiting, abdominal pain, diarrhea, constipation, blood in stool and melena.  Genitourinary: Negative for dysuria, urgency, frequency, hematuria and flank pain.  Musculoskeletal: Negative for myalgias, back pain, joint pain and falls.  Skin: Negative for itching and rash.  Neurological: Negative for dizziness, tingling, tremors, sensory change, speech change, focal weakness, seizures, loss of consciousness, weakness and headaches.  Endo/Heme/Allergies: Negative for environmental allergies and polydipsia. Does not bruise/bleed easily.  SUBJECTIVE:   VITAL SIGNS: Temp:  [99.1 F (37.3 C)-99.6 F (37.6 C)] 99.1 F (37.3 C) (01/04 1549) Pulse Rate:  [79-99] 94 (01/04 1804) Resp:  [15-36] 32 (01/04 1930) BP: (63-114)/(31-73) 102/53 (01/04 1930) SpO2:  [82 %-100 %] 93 % (01/04 1930) Weight:  [60.3 kg (133 lb)] 60.3 kg (133 lb) (01/04 1153)  PHYSICAL EXAMINATION: General:  In mild  distress Neuro:  AAOx3 no focal deficits HEENT:  Normocephalic atraumatic  on Cardiovascular:  S1 and S2 appreciated tachy Lungs:  Crackles bilaterally mid basilar on exam Abdomen:  Soft, NT, ND, + BS Musculoskeletal:  No gross deformities, no pitting edema Skin:  No rash, slight erythema over neck and chest, grossly intact  Recent Labs  Lab 10/02/17 2229 10/03/17 1113 10/04/17 0515 10/07/17 1206  NA 135  --  137 132*  K 3.6  --  3.8 3.7  CL 100*  --  100* 101  CO2 26  --  28 23  BUN 16  --  19 15  CREATININE 0.81 0.65 0.70 0.61  GLUCOSE 119*  --  109* 162*   Recent Labs  Lab 10/02/17 2229 10/03/17 1113 10/07/17 1206  HGB 10.8* 10.7* 10.6*  HCT 32.2* 32.1* 32.0*  WBC 16.0* 9.6 25.2*  PLT 418* 389 386   Dg Chest 2 View  Result Date: 10/07/2017 CLINICAL DATA:  Central chest pain for 2-3 weeks, has slept upright for past 2 nights, dry cough, shortness of breath, chest pains got very bad early this morning, history hypertension, diastolic heart failure EXAM: CHEST  2 VIEW COMPARISON:  10/02/2017 FINDINGS: Upper normal heart size with slight pulmonary vascular congestion.  Mediastinal contours normal. Minimal chronic peribronchial thickening. Lungs clear. No definite infiltrate, pleural effusion or pneumothorax. Bones demineralized with chronic compression deformity of a lower thoracic vertebra. IMPRESSION: Minimal chronic peribronchial thickening without acute infiltrate. Electronically Signed   By: Ulyses Southward M.D.   On: 10/07/2017 12:14   Ct Angio Chest Pe W Or Wo Contrast  Result Date: 10/07/2017 CLINICAL DATA:  Short of breath and hypoxia. Suspect viral illness. Rule out pulmonary embolism. EXAM: CT ANGIOGRAPHY CHEST WITH CONTRAST TECHNIQUE: Multidetector CT imaging of the chest was performed using the standard protocol during bolus administration of intravenous contrast. Multiplanar CT image reconstructions and MIPs were obtained to evaluate the vascular anatomy. CONTRAST:   ISOVUE-370 IOPAMIDOL (ISOVUE-370) INJECTION 76% COMPARISON:  Plain films of earlier today.  CT of 05/21/2017. FINDINGS: Cardiovascular: The quality of this exam for evaluation of pulmonary embolism is good. No evidence of pulmonary embolism. Bovine arch. Aortic and branch vessel atherosclerosis. Mild cardiomegaly, without pericardial effusion. Mediastinum/Nodes: Borderline enlarged right paratracheal node is similar, 10 mm. subcarinal node measures 1.4 cm on image 43/series 4 versus 1.5 cm on the prior. Upper normal. No hilar adenopathy. Small prevascular nodes are not significantly changed. Lungs/Pleura: No pleural fluid. Again identified is somewhat heterogeneous but diffuse ground-glass and less so airspace opacity. The appearance is relatively similar to 05/21/2017. Upper Abdomen: Normal imaged portions of the liver, spleen, stomach, pancreas, gallbladder, biliary tract, adrenal glands, kidneys. Musculoskeletal: T10 moderate compression deformity is similar. Review of the MIP images confirms the above findings. IMPRESSION: 1.  No evidence of pulmonary embolism. 2. Persistent or recurrent heterogeneous ground-glass and minimal airspace disease bilaterally. Differential considerations include viral/atypical bacterial infection or noncardiogenic (given absence of pleural fluid) pulmonary edema. 3.  Aortic Atherosclerosis (ICD10-I70.0). Electronically Signed   By: Jeronimo Greaves M.D.   On: 10/07/2017 17:08   Dg Chest Port 1 View  Result Date: 10/07/2017 CLINICAL DATA:  62 y/o F; hypoxia. Recent hospitalization for congestive heart failure. EXAM: PORTABLE CHEST 1 VIEW COMPARISON:  10/07/2016 chest radiograph and CT chest. FINDINGS: Stable cardiac silhouette given projection and technique. Diffusely increased hazy opacities and reticular opacities of the lungs. No definite pleural effusion. No pneumothorax. Bones are unremarkable IMPRESSION: Diffusely increased hazy and reticular opacities of the lungs may  represent pulmonary edema or atypical pneumonia. Electronically Signed   By: Mitzi Hansen M.D.   On: 10/07/2017 18:04    ASSESSMENT / PLAN: NEURO: No focal deficits GCS 15 Not on sedation No mental status changes  CARDIAC: St depressions on EKG, with h/o viral prodrome Viral Pericarditis? - 2D ECHO to assess for pericardial effusion/inflammation - Septic shock vs Anaphylatic?  (did patient have a reaction to contrast for CT?) appropiately got Epi, Solumedrol, bronchodilator and supported with oxygen and close monitoring. Continue on Vasopressors goal MAP >53mmHg Placed RIJ CVC for intravascular monit6oring and vasoactive meds.    PULMONARY: Respiratory Insufficiency  Initially thought to be secondary to Cardiogenic Pulmonary edema On 6 L [place] Goal to keep Sats > 92% Diuresis with Lasix - indwelling foley ( Strict Is and Os) More likely Noncardiogenic Pulm Edema CT chest reviewed no PE however diffuse GGOs with no effusions interlobular septal thickening.- ?PVOD Check RVP r/o infectious process   ID: Viral infection- Resp Check RVP Blood cx x 2 Droplet precaution Empiric abx  Endocrine: No H/o insulin dependence- last HgbA1c 5.5 Check TSH and Cortisol level  GI: NPO except for meds and ice chips    Heme: Microcytic Anemia  Hgb  10.6 No signs of active bleeding Pltys noted to be 386 If Hgb<7 transfuse PRBCs No PE on study and no h/o coagulopathy DVT PPx-> Hep [place] and SCDs  RENAL Baseline Cr noted No evidence of AKI Electrolytes noted Hypokalemia- supplement Lab Results  Component Value Date   CREATININE 0.61 10/07/2017   CREATININE 0.70 10/04/2017   CREATININE 0.65 10/03/2017     I, Dr Newell Coral have personally reviewed patient's available data, including medical history, events of note, physical examination and test results as part of my evaluation. I have discussed with  and other care providers such as pharmacist, RN and RRT. The  patient is critically ill with multiple organ systems failure and requires high complexity decision making for assessment and support, frequent evaluation and titration of therapies, application of advanced monitoring technologies and extensive interpretation of multiple databases. Critical Care Time devoted to patient care services described in this note is 55 Minutes. This time reflects time of care of this signee Dr Newell Coral. This critical care time does not reflect procedure time, or teaching time or supervisory time of NP but could involve care discussion time    DISPOSITION: ICU CC TIME: 55 mins PROGNOSIS: Guarded FAMILY: husband and daughter at bedside CODE STATUS: Full   Signed Dr Newell Coral Pulmonary Critical Care Locums  10/07/2017, 8:22 PM

## 2017-10-07 NOTE — ED Provider Notes (Signed)
Patient admitted to critical care and in the ED awaiting bed upstairs.  I received a phone call from the radiologist who informed me that central line tip was too deep, sitting in IVC.  Recommended retraction.  Using sterile technique I retracted line 10.5cm from 25cm to 14.5cm and replaced bandage. Repeat CXR ordered. Pt tolerated procedure well.    Leander Tout, Ambrose Finlandachel Morgan, MD 10/07/17 2157

## 2017-10-07 NOTE — ED Notes (Signed)
Bed: RESB Expected date:  Expected time:  Means of arrival:  Comments: Room 8

## 2017-10-07 NOTE — Progress Notes (Signed)
A consult was received from an ED physician for vancomycin per pharmacy dosing.  The patient's profile has been reviewed for ht/wt/allergies/indication/available labs.   A one time order has been placed for 1gm vancomycin.  Further antibiotics/pharmacy consults should be ordered by admitting physician if indicated.                       Thank you, Arley Phenixllen Helaina Stefano RPh 10/07/2017, 2:37 PM Pager [phone]

## 2017-10-07 NOTE — ED Notes (Signed)
Pt ambulated to the restroom with no assistance.

## 2017-10-07 NOTE — Telephone Encounter (Signed)
Message has been received and message left with the patient to call back if there is anything we can do for her

## 2017-10-07 NOTE — ED Provider Notes (Signed)
[facility]-EMERGENCY DEPT Provider Note   CSN: [id number] Arrival date & time: 10/07/17  1115     History   Chief Complaint Chief Complaint  Patient presents with  . Chest Pain  . Shortness of Breath    HPI Hannah Colon is a 62 y.o. female.  HPI Hannah Colon is a 62 y.o. female with history of diastolic heart failure, hypertension, presents to emergency department complaining of dyspnea and increased cough.  Patient was recently admitted and just discharged 3 days ago for acute on chronic diastolic heart failure exacerbation.  Patient states she spent 2 days in the hospital.  She states that just prior to admission she was seen by her cardiologist and had her metoprolol and her Lasix doubled.  She states since being home for the last 3 days, she has developed a new cough.  She states cough is nonproductive, severe.  She states she did not have this cough when she was in the hospital.  She admits to also having sore throat, states she thinks it is from coughing.  She reports chills.  She reports increased shortness of breath.  She reports orthopnea.  She states she has an pulse ox machine at home and states that her oxygen has been below 90 the last 2 days, and even got into 70s. she does not usually take oxygen at home.  She states that her blood pressure has been low as well.  Past Medical History:  Diagnosis Date  . Diastolic heart failure (HCC)   . Environmental and seasonal allergies   . High cholesterol   . Hypertension   . Hypertensive heart disease 05/13/2017    Patient Active Problem List   Diagnosis Date Noted  . Acute on chronic diastolic CHF (congestive heart failure) (HCC) 10/03/2017  . CAP (community acquired pneumonia) 05/22/2017  . Dyspnea on exertion   . Acute respiratory failure with hypoxia (HCC) 05/13/2017  . Chest pain on exertion 05/13/2017  . Hyperlipidemia 05/13/2017  . Hyperglycemia 05/13/2017  . Heart murmur 05/13/2017  .  Hypertensive heart disease 05/13/2017  . Acute diastolic heart failure (HCC)   . LVH (left ventricular hypertrophy)   . Respiratory distress 05/12/2017    Past Surgical History:  Procedure Laterality Date  . ABDOMINAL HYSTERECTOMY    . SHOULDER ARTHROSCOPY Right     OB History    No data available       Home Medications    Prior to Admission medications   Medication Sig Start Date End Date Taking? Authorizing Provider  acetaminophen (TYLENOL) 500 MG tablet Take 1,000 mg by mouth every 6 (six) hours as needed for mild pain, moderate pain or headache.    [provider]  aspirin EC 81 MG EC tablet Take 1 tablet (81 mg total) by mouth daily. 05/14/17   Elgergawy, Leana Roe, MD  atorvastatin (LIPITOR) 20 MG tablet Take 20 mg by mouth daily after breakfast.     [provider]  azelastine (ASTELIN) 0.1 % nasal spray Place 1 spray into both nostrils 2 (two) times daily.    [provider]  fluticasone (FLONASE) 50 MCG/ACT nasal spray Place 1 spray into both nostrils daily.    [provider]  furosemide (LASIX) 40 MG tablet Take 1 tablet (40 mg total) by mouth daily. 10/04/17   Meredeth Ide, MD  losartan (COZAAR) 25 MG tablet Take 1 tablet (25 mg total) by mouth daily. 10/04/17 10/04/18  Meredeth Ide, MD  metoprolol tartrate (  LOPRESSOR) 50 MG tablet Take 1 tablet (50 mg total) by mouth 2 (two) times daily. 09/30/17   Abelino Derrick, PA-C  Multiple Vitamin (MULTIVITAMIN WITH MINERALS) TABS tablet Take 1 tablet by mouth daily.    [provider]  potassium chloride (K-DUR) 10 MEQ tablet Take 1 tablet (10 mEq total) by mouth daily. Take along with lasix 10/04/17   Meredeth Ide, MD  VOLTAREN 1 % GEL Apply 1 application topically daily as needed (hand pain).  07/26/17   [provider]    Family History Family History  Problem Relation Age of Onset  . Dementia Mother   . Asthma Maternal Grandmother   . Heart failure Maternal Uncle      Social History Social History   Tobacco Use  . Smoking status: Never Smoker  . Smokeless tobacco: Never Used  Substance Use Topics  . Alcohol use: Yes    Alcohol/week: 2.4 - 3.0 oz    Types: 4 - 5 Glasses of wine per week  . Drug use: No     Allergies   Peanut oil and Penicillins   Review of Systems Review of Systems  Constitutional: Negative for chills and fever.  Respiratory: Positive for cough and shortness of breath. Negative for chest tightness.   Cardiovascular: Negative for chest pain, palpitations and leg swelling.  Gastrointestinal: Negative for abdominal pain, diarrhea, nausea and vomiting.  Genitourinary: Negative for dysuria, flank pain and pelvic pain.  Musculoskeletal: Negative for arthralgias, myalgias, neck pain and neck stiffness.  Skin: Negative for rash.  Neurological: Negative for dizziness, weakness and headaches.  All other systems reviewed and are negative.    Physical Exam Updated Vital Signs BP (!) 91/48 (BP Location: Left Arm)   Pulse 79   Temp 99.6 F (37.6 C) (Oral)   Resp 18   Ht 5' (1.524 m)   Wt 60.3 kg (133 lb)   SpO2 (!) 87%   BMI 25.97 kg/m   Physical Exam  Constitutional: She appears well-developed and well-nourished. No distress.  HENT:  Head: Normocephalic.  Eyes: Conjunctivae are normal.  Neck: Neck supple.  Cardiovascular: Normal rate, regular rhythm and normal heart sounds.  Pulmonary/Chest: Effort normal and breath sounds normal. No respiratory distress. She has no wheezes. She has no rales.  Abdominal: Soft. Bowel sounds are normal. She exhibits no distension. There is no tenderness. There is no rebound.  Musculoskeletal: She exhibits no edema.  Neurological: She is alert.  Skin: Skin is warm and dry.  Psychiatric: She has a normal mood and affect. Her behavior is normal.  Nursing note and vitals reviewed.    ED Treatments / Results  Labs (all labs ordered are listed, but only abnormal results are  displayed) Labs Reviewed  BASIC METABOLIC PANEL - Abnormal; Notable for the following components:      Result Value   Sodium 132 (*)    Glucose, Bld 162 (*)    All other components within normal limits  CBC - Abnormal; Notable for the following components:   WBC 25.2 (*)    RBC 3.61 (*)    Hemoglobin 10.6 (*)    HCT 32.0 (*)    All other components within normal limits  CULTURE, BLOOD (ROUTINE X 2)  CULTURE, BLOOD (ROUTINE X 2)  INFLUENZA PANEL BY PCR (TYPE A & B)  BRAIN NATRIURETIC PEPTIDE  I-STAT TROPONIN, ED  I-STAT BETA HCG BLOOD, ED ([facility], [facility], [facility] ONLY)  I-STAT CG4 LACTIC ACID, ED    EKG  EKG Interpretation None       Radiology Dg Chest 2 View  Result Date: 10/07/2017 CLINICAL DATA:  Central chest pain for 2-3 weeks, has slept upright for past 2 nights, dry cough, shortness of breath, chest pains got very bad early this morning, history hypertension, diastolic heart failure EXAM: CHEST  2 VIEW COMPARISON:  10/02/2017 FINDINGS: Upper normal heart size with slight pulmonary vascular congestion. Mediastinal contours normal. Minimal chronic peribronchial thickening. Lungs clear. No definite infiltrate, pleural effusion or pneumothorax. Bones demineralized with chronic compression deformity of a lower thoracic vertebra. IMPRESSION: Minimal chronic peribronchial thickening without acute infiltrate. Electronically Signed   By: Ulyses SouthwardMark  Boles M.D.   On: 10/07/2017 12:14    Procedures Procedures (including critical care time)  Medications Ordered in ED Medications  ceFEPIme (MAXIPIME) 1 g in dextrose 5 % 50 mL IVPB (not administered)     Initial Impression / Assessment and Plan / ED Course  I have reviewed the triage vital signs and the nursing notes.  Pertinent labs & imaging results that were available during my care of the patient were reviewed by me and considered in my medical decision making (see chart for details).     Seen and examined, patient with recent admission to  the hospital for CHF exacerbation, now with increased cough, chills, sore throat, worsening shortness of breath with hypoxia.  Differential includes worsening CHF exacerbation, versus pneumonia, bronchitis, viral upper respiratory tract infection, possibly influenza.  Patient's temperature today is 99.6.  She was found to be hypoxic in triage with a oxygen of 87% at the highest on room air.  She was placed on 2 L of oxygen by nasal cannula, with oxygen saturation coming up to about 92%.  Her blood pressure is 92/62.  Patient states blood pressure has been low due to increased Lasix and metoprolol.  Blood work obtained upfront showed white count of 25.  This is concerning for infection.  At this time she does not meet Sirs criteria, however I am concerned about infectious process and will start her on antibiotics for possible occult pneumonia versus bronchitis.  Also will get influenza panel, add lactic acid, add blood cultures.  3:39 PM Patient has not had much improvement in her breathing after breathing treatment.  I discussed patient with [facility] hospitalist who will admit.  He asked to order a CT scan of the chest.  Patient informed of the results and plan of admission and plan of obtaining CT.  Patient agreed.  Final Clinical Impressions(s) / ED Diagnoses   Final diagnoses:  Hypoxia    ED Discharge Orders    None       Jaynie CrumbleKirichenko, Benicio Manna, PA-C 10/07/17 1604    Gwyneth SproutPlunkett, Whitney, MD 10/08/17 2106

## 2017-10-07 NOTE — H&P (Signed)
History and Physical    [name]:[id number] DOB: Jul 23, 1956 DOA: 10/07/2017  PCP: Burnis Medin, PA-C  Patient coming from: home  I have personally briefly reviewed patient's old medical records in [facility]  Chief Complaint: cough and SOB  HPI: Hannah Colon is a 62 y.o. female with medical history significant of heart failure with preserved ejection fraction, hypertension, hyperlipidemia who presents after recent hospital admission for heart failure with recurrent cough shortness of breath.  She notes that she felt okay when she went home on Tuesday.  On Wednesday she noticed worsening shortness of breath and hypoxia on her pulse ox.  She is been sleeping sitting up for the last 2 months.  Chest pain with cough breathing.  Around 630 this morning she noticed coughing fits which did not improve with a cough medicine readily.  She notes her cough is nonproductive without blood.  She denies any fevers, chills, sick contacts or recent travel.  She was recently admitted to the hospital.  She notes the chest pain is a dull ache, substernal, comes and goes and is associated with her cough.  She denies any abdominal pain, lower extremity-swelling, dysuria.  ED Course: Labs, CXR, HCAP abx, hospitalist for admission.  Review of Systems: As per HPI otherwise 10 point review of systems negative.   Past Medical History:  Diagnosis Date  . Diastolic heart failure (HCC)   . Environmental and seasonal allergies   . High cholesterol   . Hypertension   . Hypertensive heart disease 05/13/2017    Past Surgical History:  Procedure Laterality Date  . ABDOMINAL HYSTERECTOMY    . SHOULDER ARTHROSCOPY Right      reports that  has never smoked. she has never used smokeless tobacco. She reports that she drinks about 2.4 - 3.0 oz of alcohol per week. She reports that she does not use drugs.  Allergies  Allergen Reactions  . Peanut Oil Swelling  . Penicillins Hives and Rash   Has patient had a PCN reaction causing immediate rash, facial/tongue/throat swelling, SOB or lightheadedness with hypotension: No Has patient had a PCN reaction causing severe rash involving mucus membranes or skin necrosis: No Has patient had a PCN reaction that required hospitalization: No Has patient had a PCN reaction occurring within the last 10 years: Yes If all of the above answers are "NO", then may proceed with Cephalosporin use.     Family History  Problem Relation Age of Onset  . Dementia Mother   . Asthma Maternal Grandmother   . Heart failure Maternal Uncle    Prior to Admission medications   Medication Sig Start Date End Date Taking? Authorizing Provider  acetaminophen (TYLENOL) 500 MG tablet Take 1,000 mg by mouth every 6 (six) hours as needed for mild pain, moderate pain or headache.   Yes [provider]  aspirin EC 81 MG EC tablet Take 1 tablet (81 mg total) by mouth daily. 05/14/17  Yes Elgergawy, Leana Roe, MD  atorvastatin (LIPITOR) 20 MG tablet Take 20 mg by mouth daily after breakfast.    Yes [provider]  azelastine (ASTELIN) 0.1 % nasal spray Place 1 spray into both nostrils 2 (two) times daily.   Yes [provider]  fluticasone (FLONASE) 50 MCG/ACT nasal spray Place 1 spray into both nostrils daily.   Yes [provider]  furosemide (LASIX) 40 MG tablet Take 1 tablet (40 mg total) by mouth daily. 10/04/17  Yes Meredeth Ide, MD  losartan (COZAAR)  25 MG tablet Take 1 tablet (25 mg total) by mouth daily. 10/04/17 10/04/18 Yes Meredeth Ide, MD  metoprolol tartrate (LOPRESSOR) 50 MG tablet Take 1 tablet (50 mg total) by mouth 2 (two) times daily. 09/30/17  Yes Kilroy, Eda Paschal, PA-C  Multiple Vitamin (MULTIVITAMIN WITH MINERALS) TABS tablet Take 1 tablet by mouth daily.   Yes [provider]  OVER THE COUNTER MEDICATION Place 3 drops under the tongue daily. Allergy drops made up at Dr office   Yes [provider]    potassium chloride (K-DUR) 10 MEQ tablet Take 1 tablet (10 mEq total) by mouth daily. Take along with lasix 10/04/17  Yes Lama, Sarina Ill, MD  VOLTAREN 1 % GEL Apply 1 application topically daily as needed (hand pain).  07/26/17  Yes [provider]    Physical Exam: Vitals:   10/07/17 1830 10/07/17 1836 10/07/17 1842 10/07/17 1845  BP: (!) 86/51 (!) 97/52 (!) 73/41 (!) 93/37  Pulse:      Resp: (!) 32 (!) 33 (!) 30 (!) 22  Temp:      TempSrc:      SpO2: 98% 100% 97% (!) 82%  Weight:      Height:        Constitutional: NAD, calm, comfortable Vitals:   10/07/17 1830 10/07/17 1836 10/07/17 1842 10/07/17 1845  BP: (!) 86/51 (!) 97/52 (!) 73/41 (!) 93/37  Pulse:      Resp: (!) 32 (!) 33 (!) 30 (!) 22  Temp:      TempSrc:      SpO2: 98% 100% 97% (!) 82%  Weight:      Height:       Eyes: PERRL, lids and conjunctivae normal ENMT: Mucous membranes are moist. Posterior pharynx clear of any exudate or lesions.Normal dentition.  Neck: normal, supple, no masses, no thyromegaly Respiratory: Diffuse fine crackles.  Good air movement.  Cardiovascular: Regular rate and rhythm, systolic murmur.No extremity edema. 2+ pedal pulses. Abdomen: no tenderness, no masses palpated. No hepatosplenomegaly. Bowel sounds positive.  Musculoskeletal: no clubbing / cyanosis. No joint deformity upper and lower extremities. Good ROM, no contractures. Normal muscle tone.  Skin: no rashes, lesions, ulcers. No induration Neurologic: CN 2-12 grossly intact. Strength 5/5 in all 4.  Psychiatric: Normal judgment and insight. Alert and oriented x 3. Normal mood.   Labs on Admission: I have personally reviewed following labs and imaging studies  CBC: Recent Labs  Lab 10/02/17 2229 10/03/17 1113 10/07/17 1206  WBC 16.0* 9.6 25.2*  HGB 10.8* 10.7* 10.6*  HCT 32.2* 32.1* 32.0*  MCV 89.0 89.2 88.6  PLT 418* 389 386   Basic Metabolic Panel: Recent Labs  Lab 10/02/17 2229 10/03/17 1113 10/04/17 0515  10/07/17 1206  NA 135  --  137 132*  K 3.6  --  3.8 3.7  CL 100*  --  100* 101  CO2 26  --  28 23  GLUCOSE 119*  --  109* 162*  BUN 16  --  19 15  CREATININE 0.81 0.65 0.70 0.61  CALCIUM 9.5  --  9.6 9.2  MG  --  1.9  --   --    GFR: Estimated Creatinine Clearance: 60.7 mL/min (by C-G formula based on SCr of 0.61 mg/dL). Liver Function Tests: No results for input(s): AST, ALT, ALKPHOS, BILITOT, PROT, ALBUMIN in the last 168 hours. No results for input(s): LIPASE, AMYLASE in the last 168 hours. No results for input(s): AMMONIA in the last 168 hours.  Coagulation Profile: No results for input(s): INR, PROTIME in the last 168 hours. Cardiac Enzymes: No results for input(s): CKTOTAL, CKMB, CKMBINDEX, TROPONINI in the last 168 hours. BNP (last 3 results) No results for input(s): PROBNP in the last 8760 hours. HbA1C: No results for input(s): HGBA1C in the last 72 hours. CBG: No results for input(s): GLUCAP in the last 168 hours. Lipid Profile: No results for input(s): CHOL, HDL, LDLCALC, TRIG, CHOLHDL, LDLDIRECT in the last 72 hours. Thyroid Function Tests: No results for input(s): TSH, T4TOTAL, FREET4, T3FREE, THYROIDAB in the last 72 hours. Anemia Panel: No results for input(s): VITAMINB12, FOLATE, FERRITIN, TIBC, IRON, RETICCTPCT in the last 72 hours. Urine analysis: No results found for: COLORURINE, APPEARANCEUR, LABSPEC, PHURINE, GLUCOSEU, HGBUR, BILIRUBINUR, KETONESUR, PROTEINUR, UROBILINOGEN, NITRITE, LEUKOCYTESUR  Radiological Exams on Admission: Dg Chest 2 View  Result Date: 10/07/2017 CLINICAL DATA:  Central chest pain for 2-3 weeks, has slept upright for past 2 nights, dry cough, shortness of breath, chest pains got very bad early this morning, history hypertension, diastolic heart failure EXAM: CHEST  2 VIEW COMPARISON:  10/02/2017 FINDINGS: Upper normal heart size with slight pulmonary vascular congestion. Mediastinal contours normal. Minimal chronic peribronchial  thickening. Lungs clear. No definite infiltrate, pleural effusion or pneumothorax. Bones demineralized with chronic compression deformity of a lower thoracic vertebra. IMPRESSION: Minimal chronic peribronchial thickening without acute infiltrate. Electronically Signed   By: Ulyses SouthwardMark  Boles M.D.   On: 10/07/2017 12:14   Ct Angio Chest Pe W Or Wo Contrast  Result Date: 10/07/2017 CLINICAL DATA:  Short of breath and hypoxia. Suspect viral illness. Rule out pulmonary embolism. EXAM: CT ANGIOGRAPHY CHEST WITH CONTRAST TECHNIQUE: Multidetector CT imaging of the chest was performed using the standard protocol during bolus administration of intravenous contrast. Multiplanar CT image reconstructions and MIPs were obtained to evaluate the vascular anatomy. CONTRAST:  100mL ISOVUE-370 IOPAMIDOL (ISOVUE-370) INJECTION 76% COMPARISON:  Plain films of earlier today.  CT of 05/21/2017. FINDINGS: Cardiovascular: The quality of this exam for evaluation of pulmonary embolism is good. No evidence of pulmonary embolism. Bovine arch. Aortic and branch vessel atherosclerosis. Mild cardiomegaly, without pericardial effusion. Mediastinum/Nodes: Borderline enlarged right paratracheal node is similar, 10 mm. subcarinal node measures 1.4 cm on image 43/series 4 versus 1.5 cm on the prior. Upper normal. No hilar adenopathy. Small prevascular nodes are not significantly changed. Lungs/Pleura: No pleural fluid. Again identified is somewhat heterogeneous but diffuse ground-glass and less so airspace opacity. The appearance is relatively similar to 05/21/2017. Upper Abdomen: Normal imaged portions of the liver, spleen, stomach, pancreas, gallbladder, biliary tract, adrenal glands, kidneys. Musculoskeletal: T10 moderate compression deformity is similar. Review of the MIP images confirms the above findings. IMPRESSION: 1.  No evidence of pulmonary embolism. 2. Persistent or recurrent heterogeneous ground-glass and minimal airspace disease  bilaterally. Differential considerations include viral/atypical bacterial infection or noncardiogenic (given absence of pleural fluid) pulmonary edema. 3.  Aortic Atherosclerosis (ICD10-I70.0). Electronically Signed   By: Jeronimo GreavesKyle  Talbot M.D.   On: 10/07/2017 17:08   Dg Chest Port 1 View  Result Date: 10/07/2017 CLINICAL DATA:  62 y/o F; hypoxia. Recent hospitalization for congestive heart failure. EXAM: PORTABLE CHEST 1 VIEW COMPARISON:  10/07/2016 chest radiograph and CT chest. FINDINGS: Stable cardiac silhouette given projection and technique. Diffusely increased hazy opacities and reticular opacities of the lungs. No definite pleural effusion. No pneumothorax. Bones are unremarkable IMPRESSION: Diffusely increased hazy and reticular opacities of the lungs may represent pulmonary edema or atypical pneumonia. Electronically Signed   By: Micah NoelLance  Furusawa-Stratton M.D.   On: 10/07/2017 18:04    EKG: Independently reviewed. Initial EKG with normal sinus rhythm, subsequent with diffuse ST depression.  Assessment/Plan Active Problems:   Hypoxia  Acute Hypoxic Respiratory Failure:  Pt presented with cough and hypoxia.  No fevers, but elevated WBC count.  Initial CXR with "minimal chronic peribronchial thickening".  Obtained CT PE protocol which showed persistent or recurrent ground glass and minimal airspace disease bilaterally (consider viral/atypical bacterial infection or noncardiogenic pulm edema), negative for PE. She decompensated after CT with worsening hypoxia.  She had received about 500 cc of NS bolus.  A repeat CXR showed diffuse increased hazy/reticular opacities suggestive of pulm edema or atypical pneumonia.  Suspect presentation 2/2 viral vs atypical pneumonia with worsening likely related to pulmonary edema.  Add on azithromycin, continue vanc and cefepime to cover potential HCAP with recent admission Will get echocardiogram Will need lasix, but will hold off until BP's improved (starting  levophed, holding on further IVF) Negative influenza, RVP pending Sputum cx, urine strep, urine legionella Critical care consult  Hypotension:  Pt developed hypotension and worsening shortness of breath shortly after her CT scan.  Ddx includes cardiogenic shock, anaphylaxis secondary to contrast reaction, septic shock.  She was given epinephrine and solumedrol and her pressures briefly improved after epi.  Discussed with PCCM and they will see patient.  Will order levophed through peripheral for now.  Continue broad spectrum abx.  Hold off on further IVF given worsened CXR as above. Echocardiogram Trend lactate Levophed via peripheral (not to titrate over 10) Hydrocortisone 50 q6 Will add contrast to allergy list  Critical care c/s  Chest pain  Abnormal EKG:  Suspect CP related to #1 with likely viral infection in cough, but seemed to worsen after her CT and developing hypotension.  Initial EKG with NSR with wandering baseline, but her repeat had diffuse ST depression.  Pt given ASA.  Discussed with on call cards who noted diffuse depression nonspecific, rec treating possible contrast reaction and call back prn.  POC troponin negative.  Trend troponins Repeat EKG in AM and as needed Echocardiogram Cards c/s as needed pending troponin, echo results  HFpEF: pt with grade 1 diastolic dysfunction.  Recently admitted for HF exacerbation.  Repeat CXR concerning for pulmonary edema. Repeat echo Lasix when pressures allow  HTN: hold metop and losartan, restart when able  As pt now on levophed, will go to ICU.  Appreciate critical care assistance.  DVT prophylaxis: lovenox  Code Status: full  Family Communication: duaghter and father at bedisde Disposition Plan: pending eval by ICU, ICU order placed Consults called: Discussed with Dr. Eden Emms over phone with cards and Dr. Juanita Craver with critical care  Admission status: ICU   Lacretia Nicks MD [facility] Pager [phone]  If  7PM-7AM, please contact night-coverage [email or web address] Password [email or web address]  10/07/2017, 6:50 PM

## 2017-10-07 NOTE — Progress Notes (Signed)
Pharmacy Antibiotic Note  Hannah Colon is a 62 y.o. female admitted on 10/07/2017 with respiratory failure, r/o pneumonia.  Pharmacy has been consulted for cefepime and vancomycin dosing.  Plan: Cefepime 2 grams IV x 1 (given in ED), then 1 gram q8h Vancomycin 1 gram IV x 1 (given in ED), then 500 mg q12h  (goal AUC 400-500) Follow renal function, cultures, clinical course  Height: 5' (152.4 cm) Weight: 133 lb (60.3 kg) IBW/kg (Calculated) : 45.5  Temp (24hrs), Avg:100.2 F (37.9 C), Min:98.8 F (37.1 C), Max:101.3 F (38.5 C)  Recent Labs  Lab 10/02/17 2229 10/03/17 1113 10/04/17 0515 10/07/17 1206 10/07/17 1454 10/07/17 1909  WBC 16.0* 9.6  --  25.2*  --   --   CREATININE 0.81 0.65 0.70 0.61  --   --   LATICACIDVEN  --   --   --   --  1.11 2.7*    Estimated Creatinine Clearance: 60.7 mL/min (by C-G formula based on SCr of 0.61 mg/dL).    Allergies  Allergen Reactions  . Peanut Oil Swelling  . Contrast Media [Iodinated Diagnostic Agents]     hypotension  . Penicillins Hives and Rash    Has patient had a PCN reaction causing immediate rash, facial/tongue/throat swelling, SOB or lightheadedness with hypotension: No Has patient had a PCN reaction causing severe rash involving mucus membranes or skin necrosis: No Has patient had a PCN reaction that required hospitalization: No Has patient had a PCN reaction occurring within the last 10 years: Yes If all of the above answers are "NO", then may proceed with Cephalosporin use.     Antimicrobials this admission: 1/4 azithro IV x 1 dose 1/4 cefepime >> 1/4 vancomycin >>   Dose adjustments this admission:  Microbiology results: 1/4  BCx: collected   Thank you for allowing pharmacy to be a part of this patient's care.  Elie Goodyandy Daxen Lanum, PharmD, BCPS Pager: [phone] 10/07/2017  10:36 PM

## 2017-10-08 ENCOUNTER — Other Ambulatory Visit: Payer: Self-pay

## 2017-10-08 ENCOUNTER — Inpatient Hospital Stay (HOSPITAL_COMMUNITY): Payer: 59

## 2017-10-08 DIAGNOSIS — R579 Shock, unspecified: Secondary | ICD-10-CM

## 2017-10-08 DIAGNOSIS — I340 Nonrheumatic mitral (valve) insufficiency: Secondary | ICD-10-CM

## 2017-10-08 LAB — ECHOCARDIOGRAM COMPLETE
CHL CUP MV DEC (S): 173
E decel time: 173 msec
E/e' ratio: 29.33
FS: 29 % (ref 28–44)
HEIGHTINCHES: 60 in
IV/PV OW: 0.99
LA ID, A-P, ES: 42 mm
LA diam index: 2.64 cm/m2
LA vol index: 45 mL/m2
LA vol: 71.5 mL
LAVOLA4C: 82.4 mL
LDCA: 3.14 cm2
LEFT ATRIUM END SYS DIAM: 42 mm
LV E/e'average: 29.33
LVEEMED: 29.33
LVELAT: 5.08 cm/s
LVOT diameter: 20 mm
MV VTI: 161 cm
MV pk A vel: 136 m/s
MVPG: 9 mmHg
MVPKEVEL: 149 m/s
PW: 15.4 mm — AB (ref 0.6–1.1)
RV LATERAL S' VELOCITY: 12.8 cm/s
RV TAPSE: 23.5 mm
TDI e' lateral: 5.08
TDI e' medial: 4.67
WEIGHTICAEL: 2204.6 [oz_av]

## 2017-10-08 LAB — GLUCOSE, CAPILLARY
GLUCOSE-CAPILLARY: 126 mg/dL — AB (ref 65–99)
GLUCOSE-CAPILLARY: 139 mg/dL — AB (ref 65–99)
GLUCOSE-CAPILLARY: 163 mg/dL — AB (ref 65–99)
GLUCOSE-CAPILLARY: 179 mg/dL — AB (ref 65–99)
Glucose-Capillary: 179 mg/dL — ABNORMAL HIGH (ref 65–99)
Glucose-Capillary: 192 mg/dL — ABNORMAL HIGH (ref 65–99)

## 2017-10-08 LAB — BASIC METABOLIC PANEL
ANION GAP: 10 (ref 5–15)
BUN: 13 mg/dL (ref 6–20)
CHLORIDE: 102 mmol/L (ref 101–111)
CO2: 23 mmol/L (ref 22–32)
Calcium: 9 mg/dL (ref 8.9–10.3)
Creatinine, Ser: 0.8 mg/dL (ref 0.44–1.00)
GFR calc non Af Amer: >60 mL/min (ref >60)
Glucose, Bld: 193 mg/dL — ABNORMAL HIGH (ref 65–99)
POTASSIUM: 3.6 mmol/L (ref 3.5–5.1)
Sodium: 135 mmol/L (ref 135–145)

## 2017-10-08 LAB — RESPIRATORY PANEL BY PCR
Adenovirus: NOT DETECTED
BORDETELLA PERTUSSIS-RVPCR: NOT DETECTED
CHLAMYDOPHILA PNEUMONIAE-RVPPCR: NOT DETECTED
Coronavirus 229E: NOT DETECTED
Coronavirus HKU1: NOT DETECTED
Coronavirus NL63: NOT DETECTED
Coronavirus OC43: NOT DETECTED
INFLUENZA B-RVPPCR: NOT DETECTED
Influenza A: NOT DETECTED
METAPNEUMOVIRUS-RVPPCR: NOT DETECTED
Mycoplasma pneumoniae: NOT DETECTED
PARAINFLUENZA VIRUS 2-RVPPCR: NOT DETECTED
Parainfluenza Virus 1: NOT DETECTED
Parainfluenza Virus 3: NOT DETECTED
Parainfluenza Virus 4: NOT DETECTED
RESPIRATORY SYNCYTIAL VIRUS-RVPPCR: NOT DETECTED
Rhinovirus / Enterovirus: NOT DETECTED

## 2017-10-08 LAB — CBC
HCT: 29 % — ABNORMAL LOW (ref 36.0–46.0)
HEMOGLOBIN: 9.7 g/dL — AB (ref 12.0–15.0)
MCH: 29.7 pg (ref 26.0–34.0)
MCHC: 33.4 g/dL (ref 30.0–36.0)
MCV: 88.7 fL (ref 78.0–100.0)
Platelets: 411 10*3/uL — ABNORMAL HIGH (ref 150–400)
RBC: 3.27 MIL/uL — AB (ref 3.87–5.11)
RDW: 14.6 % (ref 11.5–15.5)
WBC: 28.3 10*3/uL — AB (ref 4.0–10.5)

## 2017-10-08 LAB — HIV ANTIBODY (ROUTINE TESTING W REFLEX): HIV SCREEN 4TH GENERATION: NONREACTIVE

## 2017-10-08 LAB — CK TOTAL AND CKMB (NOT AT ARMC)
CK, MB: 9 ng/mL — AB (ref 0.5–5.0)
Relative Index: INVALID (ref 0.0–2.5)
Total CK: 25 U/L — ABNORMAL LOW (ref 38–234)

## 2017-10-08 LAB — MRSA PCR SCREENING: MRSA by PCR: NEGATIVE

## 2017-10-08 LAB — MAGNESIUM: Magnesium: 1.7 mg/dL (ref 1.7–2.4)

## 2017-10-08 LAB — PHOSPHORUS: Phosphorus: 4.4 mg/dL (ref 2.5–4.6)

## 2017-10-08 LAB — CBG MONITORING, ED: Glucose-Capillary: 201 mg/dL — ABNORMAL HIGH (ref 65–99)

## 2017-10-08 LAB — STREP PNEUMONIAE URINARY ANTIGEN: STREP PNEUMO URINARY ANTIGEN: NEGATIVE

## 2017-10-08 LAB — SEDIMENTATION RATE: SED RATE: 60 mm/h — AB (ref 0–22)

## 2017-10-08 LAB — TROPONIN I: TROPONIN I: 0.22 ng/mL — AB (ref <0.03)

## 2017-10-08 LAB — PROCALCITONIN: Procalcitonin: 0.2 ng/mL

## 2017-10-08 MED ORDER — MAGNESIUM SULFATE 2 GM/50ML IV SOLN
2.0000 g | Freq: Once | INTRAVENOUS | Status: AC
  Administered 2017-10-08: 2 g via INTRAVENOUS
  Filled 2017-10-08: qty 50

## 2017-10-08 MED ORDER — SODIUM CHLORIDE 0.9% FLUSH
10.0000 mL | INTRAVENOUS | Status: DC | PRN
  Administered 2017-10-10: 30 mL
  Filled 2017-10-08: qty 40

## 2017-10-08 MED ORDER — SODIUM CHLORIDE 0.9% FLUSH
10.0000 mL | Freq: Two times a day (BID) | INTRAVENOUS | Status: DC
  Administered 2017-10-08 – 2017-10-13 (×7): 10 mL

## 2017-10-08 MED ORDER — ORAL CARE MOUTH RINSE
15.0000 mL | Freq: Two times a day (BID) | OROMUCOSAL | Status: DC
  Administered 2017-10-08 – 2017-10-13 (×9): 15 mL via OROMUCOSAL

## 2017-10-08 MED ORDER — LACTATED RINGERS IV BOLUS (SEPSIS)
500.0000 mL | Freq: Once | INTRAVENOUS | Status: AC
  Administered 2017-10-08: 500 mL via INTRAVENOUS

## 2017-10-08 MED ORDER — CHLORHEXIDINE GLUCONATE CLOTH 2 % EX PADS
6.0000 | MEDICATED_PAD | Freq: Every day | CUTANEOUS | Status: DC
  Administered 2017-10-08 – 2017-10-11 (×4): 6 via TOPICAL

## 2017-10-08 MED ORDER — GUAIFENESIN-CODEINE 100-10 MG/5ML PO SOLN: 5.0000 mL | ORAL | Status: DC | PRN

## 2017-10-08 MED ORDER — NOREPINEPHRINE 4 MG/250ML-% IV SOLN
0.0000 ug/min | INTRAVENOUS | Status: DC
  Administered 2017-10-08: 2 ug/min via INTRAVENOUS
  Filled 2017-10-08: qty 250

## 2017-10-08 MED ORDER — IPRATROPIUM-ALBUTEROL 0.5-2.5 (3) MG/3ML IN SOLN
3.0000 mL | Freq: Four times a day (QID) | RESPIRATORY_TRACT | Status: DC | PRN
  Administered 2017-10-08: 3 mL via RESPIRATORY_TRACT
  Filled 2017-10-08 (×2): qty 3

## 2017-10-08 NOTE — Progress Notes (Signed)
  Echocardiogram 2D Echocardiogram has been performed.  [name], Amitai Delaughter F 10/08/2017, 10:37 AM

## 2017-10-08 NOTE — Consult Note (Signed)
.. ..  Name: Hannah Colon MRN: [id number] DOB: 1955/11/15    ADMISSION DATE:  10/07/2017 CONSULTATION DATE:  10/07/17  REFERRING MD :  Marcelline Deist MD  CHIEF COMPLAINT:  SOB   SIGNIFICANT EVENTS  Started Levophed ggt  STUDIES:  CT PE study    BRIEF 62 yr old female with PMHx of HFpEF, HTN, HLD, who states that until a few weeks ago she was in her usual state of health. Then she reports eating some fried [place] with other indulgent foods and was seen by her Cardiologist and required increased doses of her medications especially her diuretics.  Per her chart she was seen in the Office on 12/28 and in the ED 12/30.  For the last couple of days she has had a cough with worsening chest discomfort, chills but denies fevers states that her pulse ox per her [employer] phone was 86-87% Sat ( it typically runs 3 pts below hospital pulse ox). She denies leg swelling but c/o orthopnea. Initially admitted to Hospitalist service. Pt went for CT scan and when she returned she had worsening dyspnea, chest pain, emesis and desaturating. Her MAP dropped 20mHg and patient was placed on NRB mask and started on  Levophed ggt via peripheral IV. 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 Still on low dose levophed. CVP low.   Hx retake: endorses dyspnea since aug 2018. Intermittent. Stress test Aug 2018 normal. Echo Aug 2018: Gr1 diast dysfn. Got better in aug 2018 but then worse again over holidays. 2nd admit since 10/03/17 and 4th since august. Now with new hypoxemic reso failure 5L [place]. CT augu 2018 -> 10/07/2017 wrsening GGO. Radiology questioning primary lung issues. She wants testing for celiac disease due to famil hx (aunt and cousin)  VITAL SIGNS: Temp:  [97.9 F (36.6 C)-101.3 F (38.5 C)] 98.2 F (36.8 C) (01/05 0800) Pulse Rate:  [70-99] 72 (01/05 0800) Resp:  [13-43] 18 (01/05 0800) BP:  (63-158)/(31-73) 109/47 (01/05 0800) SpO2:  [82 %-100 %] 97 % (01/05 0856) Weight:  [60.3 kg (133 lb)-62.5 kg (137 lb 12.6 oz)] 62.5 kg (137 lb 12.6 oz) (01/05 0200)  PHYSICAL EXAMINATION:  General Appearance:    Looks well OBESE - +  Head:    Normocephalic, without obvious abnormality, atraumatic  Eyes:    PERRL - yes, conjunctiva/corneas - clear      Ears:    Normal external ear canals, both ears  Nose:   NG tube - no but has [place] 5L  Throat:  ETT TUBE - no , OG tube - no  Neck:   Supple,  No enlargement/tenderness/nodules     Lungs:     No distress but has bilateral crackles. Difuse . Esp base  Chest wall:    No deformity  Heart:    S1 and S2 normal, ESM murmur +, CVP - no.  Pressors - no  Abdomen:     Soft, no masses, no organomegaly  Genitalia:    Not done  Rectal:   not done  Extremities:   Extremities- intact     Skin:   Intact in exposed areas . Sacral area - none     Neurologic:   Sedation - none -> RASS - 0 . Moves all 4s - yes. CAM-ICU - neg . Orientation - x3+     PULMONARY Recent Labs  Lab 10/07/17 2330  O2SAT 77.2  CBC Recent Labs  Lab 10/03/17 1113 10/07/17 1206 10/08/17 0055  HGB 10.7* 10.6* 9.7*  HCT 32.1* 32.0* 29.0*  WBC 9.6 25.2* 28.3*  PLT 389 386 411*    COAGULATION No results for input(s): INR in the last 168 hours.  CARDIAC   Recent Labs  Lab 10/07/17 1850 10/07/17 1909 10/08/17 0050 10/08/17 0632  TROPONINI 0.04* <0.03 0.15* 0.22*   No results for input(s): PROBNP in the last 168 hours.   CHEMISTRY Recent Labs  Lab 10/02/17 2229 10/03/17 1113 10/04/17 0515 10/07/17 1206 10/08/17 0055  NA 135  --  137 132* 135  K 3.6  --  3.8 3.7 3.6  CL 100*  --  100* 101 102  CO2 26  --  _0 GLUCOSE 119*  --  109* 162* 193*  BUN 16  --  _1 CREATININE 0.81 0.65 0.70 0.61 0.80  CALCIUM 9.5  --  9.6 9.2 9.0  MG  --  1.9  --   --  1.7  PHOS  --   --   --   --  4.4   Estimated Creatinine Clearance: 61.7 mL/min (by C-G  formula based on SCr of 0.8 mg/dL).   LIVER No results for input(s): AST, ALT, ALKPHOS, BILITOT, PROT, ALBUMIN, INR in the last 168 hours.   INFECTIOUS Recent Labs  Lab 10/07/17 1454 10/07/17 1909 10/07/17 1953 10/07/17 2139 10/08/17 0055  LATICACIDVEN 1.11 2.7*  --  1.2  --   PROCALCITON  --   --  0.13  --  0.20     ENDOCRINE CBG (last 3)  Recent Labs    10/08/17 0033 10/08/17 0335 10/08/17 0751  GLUCAP 201* 179* 179*         IMAGING x48h  - image(s) personally visualized  -   highlighted in bold Dg Chest 2 View  Result Date: 10/07/2017 CLINICAL DATA:  Central chest pain for 2-3 weeks, has slept upright for past 2 nights, dry cough, shortness of breath, chest pains got very bad early this morning, history hypertension, diastolic heart failure EXAM: CHEST  2 VIEW COMPARISON:  10/02/2017 FINDINGS: Upper normal heart size with slight pulmonary vascular congestion. Mediastinal contours normal. Minimal chronic peribronchial thickening. Lungs clear. No definite infiltrate, pleural effusion or pneumothorax. Bones demineralized with chronic compression deformity of a lower thoracic vertebra. IMPRESSION: Minimal chronic peribronchial thickening without acute infiltrate. Electronically Signed   By: Lavonia Dana M.D.   On: 10/07/2017 12:14   Ct Angio Chest Pe W Or Wo Contrast  Result Date: 10/07/2017 CLINICAL DATA:  Short of breath and hypoxia. Suspect viral illness. Rule out pulmonary embolism. EXAM: CT ANGIOGRAPHY CHEST WITH CONTRAST TECHNIQUE: Multidetector CT imaging of the chest was performed using the standard protocol during bolus administration of intravenous contrast. Multiplanar CT image reconstructions and MIPs were obtained to evaluate the vascular anatomy. CONTRAST:  155m ISOVUE-370 IOPAMIDOL (ISOVUE-370) INJECTION 76% COMPARISON:  Plain films of earlier today.  CT of 05/21/2017. FINDINGS: Cardiovascular: The quality of this exam for evaluation of pulmonary embolism is  good. No evidence of pulmonary embolism. Bovine arch. Aortic and branch vessel atherosclerosis. Mild cardiomegaly, without pericardial effusion. Mediastinum/Nodes: Borderline enlarged right paratracheal node is similar, 10 mm. subcarinal node measures 1.4 cm on image 43/series 4 versus 1.5 cm on the prior. Upper normal. No hilar adenopathy. Small prevascular nodes are not significantly changed. Lungs/Pleura: No pleural fluid. Again identified is somewhat heterogeneous but diffuse ground-glass and less so airspace opacity. The  appearance is relatively similar to 05/21/2017. Upper Abdomen: Normal imaged portions of the liver, spleen, stomach, pancreas, gallbladder, biliary tract, adrenal glands, kidneys. Musculoskeletal: T10 moderate compression deformity is similar. Review of the MIP images confirms the above findings. IMPRESSION: 1.  No evidence of pulmonary embolism. 2. Persistent or recurrent heterogeneous ground-glass and minimal airspace disease bilaterally. Differential considerations include viral/atypical bacterial infection or noncardiogenic (given absence of pleural fluid) pulmonary edema. 3.  Aortic Atherosclerosis (ICD10-I70.0). Electronically Signed   By: Abigail Miyamoto M.D.   On: 10/07/2017 17:08   Dg Chest Portable 1 View  Result Date: 10/07/2017 CLINICAL DATA:  Initial evaluation for central line placement. EXAM: PORTABLE CHEST 1 VIEW COMPARISON:  Prior radiograph from earlier the same day. FINDINGS: Right IJ approach centra venous catheter has been retracted, with tip now overlying the proximal right atrium. Stable cardiac and mediastinal silhouettes. Lungs hypoinflated. Diffuse vascular congestion with interstitial prominence, favored to reflect edema, similar to previous. No pleural effusion. No new focal infiltrates. No pneumothorax. Osseous structures are unchanged. IMPRESSION: 1. Interval retraction of right IJ approach centra venous catheter, tip now overlying the proximal right atrium. 2.  Otherwise stable appearance of the chest with diffuse bilateral airspace disease. Electronically Signed   By: Jeannine Boga M.D.   On: 10/07/2017 22:26   Dg Chest Portable 1 View  Result Date: 10/07/2017 CLINICAL DATA:  Central line placement EXAM: PORTABLE CHEST 1 VIEW COMPARISON:  Earlier the same day FINDINGS: 2015 hours. Right IJ central line tip projects inferior to the heart, in the region of the intrahepatic IVC. This could be pulled back 10-11 cm for positioning in the region of the SVC/RA junction. After repositioning, tip position could be confirmed with a repeat chest x-ray. The diffuse bilateral airspace disease is similar to prior. Heart size upper normal and stable. The visualized bony structures of the thorax are intact. Telemetry leads overlie the chest. IMPRESSION: Right IJ central line tip projects inferior to the heart, overlying the IVC. This could be pulled back 10-11 cm for more appropriate positioning and reconfirmed with another chest x-ray. No pneumothorax. Similar appearance diffuse bilateral airspace disease. I personally called this result to Dr. Rex Kras, in the emergency department, at 2046 hours on 10/07/2017. Electronically Signed   By: Misty Stanley M.D.   On: 10/07/2017 20:47   Dg Chest Port 1 View  Result Date: 10/07/2017 CLINICAL DATA:  62 y/o F; hypoxia. Recent hospitalization for congestive heart failure. EXAM: PORTABLE CHEST 1 VIEW COMPARISON:  10/07/2016 chest radiograph and CT chest. FINDINGS: Stable cardiac silhouette given projection and technique. Diffusely increased hazy opacities and reticular opacities of the lungs. No definite pleural effusion. No pneumothorax. Bones are unremarkable IMPRESSION: Diffusely increased hazy and reticular opacities of the lungs may represent pulmonary edema or atypical pneumonia. Electronically Signed   By: Kristine Garbe M.D.   On: 10/07/2017 18:04        ASSESSMENT / PLAN: NEURO: No focal deficits GCS  15 Not on sedation No mental status changes  PLAN monitor  CARDIAC: In circulatory shock post CTA but CVP low and suspect volume deplete EKG with ST changes  10/08/2017= 28mg levophed  Plan Await echo result FLuid bolus     PULMONARY: Respiratory Insufficiency Acute hypoxemic resp failoure Diffus GGO on CT and seems same progress since aug 2018 but now worse. Denies mold or bird exposure at home  PLAN Check RVP Check autoimmune and vasculitis panel Await echo - might need right heart cath to differential HEart  v Lung issue If all of above negative, consider bronch with BAL +/- Tbbx For now , o2 for pusle ox > 88%   Endocrine: No H/o insulin dependence- last HgbA1c 5.5 Check TSH and Cortisol level  GI: Oral diet hearrt healthy   ID  - unclear if this is infectiojs - low grade PCT suggests possible localized precoeess s  LAN abx for now Track cultures and biomarkers   Heme: Microcytic Anemia  Hgb 10.6 No signs of active bleeding Pltys noted to be 386 If Hgb<7 transfuse PRBCs No PE on study and no h/o coagulopathy DVT PPx-> Hep [place] and SCDs  RENAL Mild hypomag  Plan Replete   FMAILY: husband and patient updated in details   The patient is critically ill with multiple organ systems failure and requires high complexity decision making for assessment and support, frequent evaluation and titration of therapies, application of advanced monitoring technologies and extensive interpretation of multiple databases.   Critical Care Time devoted to patient care services described in this note is  30  Minutes. This time reflects time of care of this signee Dr Brand Males. This critical care time does not reflect procedure time, or teaching time or supervisory time of PA/NP/Med student/Med Resident etc but could involve care discussion time    Dr. Brand Males, M.D., [facility].C.P Pulmonary and Critical Care Medicine Staff Physician [place] [facility] Pager: [phone], If no answer or between  15:00h - 7:00h: call [phone]  10/08/2017 10:22 AM

## 2017-10-08 NOTE — ED Notes (Signed)
ED TO INPATIENT HANDOFF REPORT  Name/Age/Gender Hannah Colon 62 y.o. female  Code Status    Code Status Orders  (From admission, onward)        Start     Ordered   10/07/17 2353  Full code  Continuous     10/07/17 2352    Code Status History    Date Active Date Inactive Code Status Order ID Comments User Context   10/07/2017 19:47 10/07/2017 23:52 Full Code [id number]  Omar Person, NP ED   10/03/2017 11:09 10/04/2017 15:55 Full Code [id number]  Barton Dubois, MD Inpatient   05/22/2017 00:25 05/23/2017 15:12 Full Code [id number]  Vianne Bulls, MD ED   05/13/2017 00:30 05/13/2017 18:19 Full Code [id number]  Karmen Bongo, MD Inpatient      Home/SNF/Other Home  Chief Complaint chest pain   SOB   Level of Care/Admitting Diagnosis ED Disposition    ED Disposition Condition [facility]: [facility] [[id number]]  Level of Care: ICU [6]  Diagnosis: Chest pain [[id number]]  Admitting Physician: Eustaquio Boyden  Attending Physician: Juanito Doom [[id number]]  Estimated length of stay: past midnight tomorrow  Certification:: I certify this patient will need inpatient services for at least 2 midnights  PT Class (Do Not Modify): Inpatient [101]  PT Acc Code (Do Not Modify): Private [1]       Medical History Past Medical History:  Diagnosis Date  . Diastolic heart failure ([place])   . Environmental and seasonal allergies   . High cholesterol   . Hypertension   . Hypertensive heart disease 05/13/2017    Allergies Allergies  Allergen Reactions  . Peanut Oil Swelling  . Contrast Media [Iodinated Diagnostic Agents]     hypotension  . Penicillins Hives and Rash    Has patient had a PCN reaction causing immediate rash, facial/tongue/throat swelling, SOB or lightheadedness with hypotension: No Has patient had a PCN reaction causing severe rash involving mucus membranes or skin necrosis: No Has patient had a PCN reaction that  required hospitalization: No Has patient had a PCN reaction occurring within the last 10 years: Yes If all of the above answers are "NO", then may proceed with Cephalosporin use.     IV Location/Drains/Wounds Patient Lines/Drains/Airways Status   Active Line/Drains/Airways    Name:   Placement date:   Placement time:   Site:   Days:   Peripheral IV 10/07/17 Left Antecubital   10/07/17    1451    Antecubital   1   Peripheral IV 10/07/17 Left Wrist   10/07/17    1812    Wrist   1   CVC Triple Lumen 10/07/17 Right Internal jugular 20 cm   10/07/17    2022     1   Urethral Catheter Terri Doster RN Temperature probe 14 Fr.   10/07/17    2046    Temperature probe   1          Labs/Imaging Results for orders placed or performed during the hospital encounter of 10/07/17 (from the past 48 hour(s))  Basic metabolic panel     Status: Abnormal   Collection Time: 10/07/17 12:06 PM  Result Value Ref Range   Sodium 132 (L) 135 - 145 mmol/L   Potassium 3.7 3.5 - 5.1 mmol/L   Chloride 101 101 - 111 mmol/L   CO2 23 22 - 32 mmol/L   Glucose, Bld 162 (H) 65 - 99 mg/dL  BUN 15 6 - 20 mg/dL   Creatinine, Ser 0.61 0.44 - 1.00 mg/dL   Calcium 9.2 8.9 - 10.3 mg/dL   GFR calc non Af Amer >60 >60 mL/min   GFR calc Af Amer >60 >60 mL/min    Comment: (NOTE) The eGFR has been calculated using the CKD EPI equation. This calculation has not been validated in all clinical situations. eGFR's persistently <60 mL/min signify possible Chronic Kidney Disease.    Anion gap 8 5 - 15  CBC     Status: Abnormal   Collection Time: 10/07/17 12:06 PM  Result Value Ref Range   WBC 25.2 (H) 4.0 - 10.5 K/uL   RBC 3.61 (L) 3.87 - 5.11 MIL/uL   Hemoglobin 10.6 (L) 12.0 - 15.0 g/dL   HCT 32.0 (L) 36.0 - 46.0 %   MCV 88.6 78.0 - 100.0 fL   MCH 29.4 26.0 - 34.0 pg   MCHC 33.1 30.0 - 36.0 g/dL   RDW 14.4 11.5 - 15.5 %   Platelets 386 150 - 400 K/uL  Brain natriuretic peptide     Status: None   Collection Time:  10/07/17 12:06 PM  Result Value Ref Range   B Natriuretic Peptide 98.8 0.0 - 100.0 pg/mL  I-Stat beta hCG blood, ED     Status: None   Collection Time: 10/07/17 12:19 PM  Result Value Ref Range   I-stat hCG, quantitative <5.0 <5 mIU/mL   Comment 3            Comment:   GEST. AGE      CONC.  (mIU/mL)   <=1 WEEK        5 - 50     2 WEEKS       50 - 500     3 WEEKS       100 - 10,000     4 WEEKS     1,000 - 30,000        FEMALE AND NON-PREGNANT FEMALE:     LESS THAN 5 mIU/mL   I-stat troponin, ED     Status: None   Collection Time: 10/07/17 12:20 PM  Result Value Ref Range   Troponin i, poc 0.00 0.00 - 0.08 ng/mL   Comment 3            Comment: Due to the release kinetics of cTnI, a negative result within the first hours of the onset of symptoms does not rule out myocardial infarction with certainty. If myocardial infarction is still suspected, repeat the test at appropriate intervals.   Influenza panel by PCR (type A & B)     Status: None   Collection Time: 10/07/17  2:17 PM  Result Value Ref Range   Influenza A By PCR NEGATIVE NEGATIVE   Influenza B By PCR NEGATIVE NEGATIVE    Comment: (NOTE) The Xpert Xpress Flu assay is intended as an aid in the diagnosis of  influenza and should not be used as a sole basis for treatment.  This  assay is FDA approved for nasopharyngeal swab specimens only. Nasal  washings and aspirates are unacceptable for Xpert Xpress Flu testing.   I-Stat CG4 Lactic Acid, ED     Status: None   Collection Time: 10/07/17  2:54 PM  Result Value Ref Range   Lactic Acid, Venous 1.11 0.5 - 1.9 mmol/L  I-Stat Troponin, ED (not at [facility])     Status: None   Collection Time: 10/07/17  5:51 PM  Result Value Ref  Range   Troponin i, poc 0.00 0.00 - 0.08 ng/mL   Comment 3            Comment: Due to the release kinetics of cTnI, a negative result within the first hours of the onset of symptoms does not rule out myocardial infarction with certainty. If myocardial  infarction is still suspected, repeat the test at appropriate intervals.   Troponin I (q 6hr x 3)     Status: Abnormal   Collection Time: 10/07/17  6:50 PM  Result Value Ref Range   Troponin I 0.04 (HH) <0.03 ng/mL    Comment: CRITICAL RESULT CALLED TO, READ BACK BY AND VERIFIED WITH: Dolphus Jenny '@[id number]'  10/07/17 MKELLY   Lactic acid, plasma     Status: Abnormal   Collection Time: 10/07/17  7:09 PM  Result Value Ref Range   Lactic Acid, Venous 2.7 (HH) 0.5 - 1.9 mmol/L    Comment: CRITICAL RESULT CALLED TO, READ BACK BY AND VERIFIED WITH: TEUP,N. RN '@[id number]'  ON 01.04.19 BY COHEN,K   Troponin I     Status: None   Collection Time: 10/07/17  7:09 PM  Result Value Ref Range   Troponin I <0.03 <0.03 ng/mL  Procalcitonin - Baseline     Status: None   Collection Time: 10/07/17  7:53 PM  Result Value Ref Range   Procalcitonin 0.13 ng/mL    Comment:        Interpretation: PCT (Procalcitonin) <= 0.5 ng/mL: Systemic infection (sepsis) is not likely. Local bacterial infection is possible. (NOTE)       Sepsis PCT Algorithm           Lower Respiratory Tract                                      Infection PCT Algorithm    ----------------------------     ----------------------------         PCT < 0.25 ng/mL                PCT < 0.10 ng/mL         Strongly encourage             Strongly discourage   discontinuation of antibiotics    initiation of antibiotics    ----------------------------     -----------------------------       PCT 0.25 - 0.50 ng/mL            PCT 0.10 - 0.25 ng/mL               OR       >80% decrease in PCT            Discourage initiation of                                            antibiotics      Encourage discontinuation           of antibiotics    ----------------------------     -----------------------------         PCT >= 0.50 ng/mL              PCT 0.26 - 0.50 ng/mL               AND        <  80% decrease in PCT             Encourage initiation of                                              antibiotics       Encourage continuation           of antibiotics    ----------------------------     -----------------------------        PCT >= 0.50 ng/mL                  PCT > 0.50 ng/mL               AND         increase in PCT                  Strongly encourage                                      initiation of antibiotics    Strongly encourage escalation           of antibiotics                                     -----------------------------                                           PCT <= 0.25 ng/mL                                                 OR                                        > 80% decrease in PCT                                     Discontinue / Do not initiate                                             antibiotics   CBG monitoring, ED     Status: Abnormal   Collection Time: 10/07/17  9:31 PM  Result Value Ref Range   Glucose-Capillary 180 (H) 65 - 99 mg/dL   Comment 1 Notify RN   Lactic acid, plasma     Status: None   Collection Time: 10/07/17  9:39 PM  Result Value Ref Range   Lactic Acid, Venous 1.2 0.5 - 1.9 mmol/L  .Cooxemetry Panel (carboxy, met, total hgb, O2 sat)     Status: Abnormal   Collection Time: 10/07/17 11:30 PM  Result Value Ref Range   Total hemoglobin 10.1 (L) 12.0 - 16.0  g/dL   O2 Saturation 77.2 %   Carboxyhemoglobin 1.1 0.5 - 1.5 %   Methemoglobin 1.0 0.0 - 1.5 %   Dg Chest 2 View  Result Date: 10/07/2017 CLINICAL DATA:  Central chest pain for 2-3 weeks, has slept upright for past 2 nights, dry cough, shortness of breath, chest pains got very bad early this morning, history hypertension, diastolic heart failure EXAM: CHEST  2 VIEW COMPARISON:  10/02/2017 FINDINGS: Upper normal heart size with slight pulmonary vascular congestion. Mediastinal contours normal. Minimal chronic peribronchial thickening. Lungs clear. No definite infiltrate, pleural effusion or pneumothorax. Bones demineralized with chronic  compression deformity of a lower thoracic vertebra. IMPRESSION: Minimal chronic peribronchial thickening without acute infiltrate. Electronically Signed   By: Lavonia Dana M.D.   On: 10/07/2017 12:14   Ct Angio Chest Pe W Or Wo Contrast  Result Date: 10/07/2017 CLINICAL DATA:  Short of breath and hypoxia. Suspect viral illness. Rule out pulmonary embolism. EXAM: CT ANGIOGRAPHY CHEST WITH CONTRAST TECHNIQUE: Multidetector CT imaging of the chest was performed using the standard protocol during bolus administration of intravenous contrast. Multiplanar CT image reconstructions and MIPs were obtained to evaluate the vascular anatomy. CONTRAST:  114m ISOVUE-370 IOPAMIDOL (ISOVUE-370) INJECTION 76% COMPARISON:  Plain films of earlier today.  CT of 05/21/2017. FINDINGS: Cardiovascular: The quality of this exam for evaluation of pulmonary embolism is good. No evidence of pulmonary embolism. Bovine arch. Aortic and branch vessel atherosclerosis. Mild cardiomegaly, without pericardial effusion. Mediastinum/Nodes: Borderline enlarged right paratracheal node is similar, 10 mm. subcarinal node measures 1.4 cm on image 43/series 4 versus 1.5 cm on the prior. Upper normal. No hilar adenopathy. Small prevascular nodes are not significantly changed. Lungs/Pleura: No pleural fluid. Again identified is somewhat heterogeneous but diffuse ground-glass and less so airspace opacity. The appearance is relatively similar to 05/21/2017. Upper Abdomen: Normal imaged portions of the liver, spleen, stomach, pancreas, gallbladder, biliary tract, adrenal glands, kidneys. Musculoskeletal: T10 moderate compression deformity is similar. Review of the MIP images confirms the above findings. IMPRESSION: 1.  No evidence of pulmonary embolism. 2. Persistent or recurrent heterogeneous ground-glass and minimal airspace disease bilaterally. Differential considerations include viral/atypical bacterial infection or noncardiogenic (given absence of pleural  fluid) pulmonary edema. 3.  Aortic Atherosclerosis (ICD10-I70.0). Electronically Signed   By: KAbigail MiyamotoM.D.   On: 10/07/2017 17:08   Dg Chest Portable 1 View  Result Date: 10/07/2017 CLINICAL DATA:  Initial evaluation for central line placement. EXAM: PORTABLE CHEST 1 VIEW COMPARISON:  Prior radiograph from earlier the same day. FINDINGS: Right IJ approach centra venous catheter has been retracted, with tip now overlying the proximal right atrium. Stable cardiac and mediastinal silhouettes. Lungs hypoinflated. Diffuse vascular congestion with interstitial prominence, favored to reflect edema, similar to previous. No pleural effusion. No new focal infiltrates. No pneumothorax. Osseous structures are unchanged. IMPRESSION: 1. Interval retraction of right IJ approach centra venous catheter, tip now overlying the proximal right atrium. 2. Otherwise stable appearance of the chest with diffuse bilateral airspace disease. Electronically Signed   By: BJeannine BogaM.D.   On: 10/07/2017 22:26   Dg Chest Portable 1 View  Result Date: 10/07/2017 CLINICAL DATA:  Central line placement EXAM: PORTABLE CHEST 1 VIEW COMPARISON:  Earlier the same day FINDINGS: 2015 hours. Right IJ central line tip projects inferior to the heart, in the region of the intrahepatic IVC. This could be pulled back 10-11 cm for positioning in the region of the SVC/RA junction. After repositioning, tip position could be confirmed  with a repeat chest x-ray. The diffuse bilateral airspace disease is similar to prior. Heart size upper normal and stable. The visualized bony structures of the thorax are intact. Telemetry leads overlie the chest. IMPRESSION: Right IJ central line tip projects inferior to the heart, overlying the IVC. This could be pulled back 10-11 cm for more appropriate positioning and reconfirmed with another chest x-ray. No pneumothorax. Similar appearance diffuse bilateral airspace disease. I personally called this result  to Dr. Rex Kras, in the emergency department, at 2046 hours on 10/07/2017. Electronically Signed   By: Misty Stanley M.D.   On: 10/07/2017 20:47   Dg Chest Port 1 View  Result Date: 10/07/2017 CLINICAL DATA:  62 y/o F; hypoxia. Recent hospitalization for congestive heart failure. EXAM: PORTABLE CHEST 1 VIEW COMPARISON:  10/07/2016 chest radiograph and CT chest. FINDINGS: Stable cardiac silhouette given projection and technique. Diffusely increased hazy opacities and reticular opacities of the lungs. No definite pleural effusion. No pneumothorax. Bones are unremarkable IMPRESSION: Diffusely increased hazy and reticular opacities of the lungs may represent pulmonary edema or atypical pneumonia. Electronically Signed   By: Kristine Garbe M.D.   On: 10/07/2017 18:04    Pending Labs Unresulted Labs (From admission, onward)   Start     Ordered   10/08/17 9753  Basic metabolic panel  Tomorrow morning,   R     10/07/17 1948   10/08/17 0500  CBC  Tomorrow morning,   R     10/07/17 1948   10/08/17 0500  Magnesium  Tomorrow morning,   R     10/07/17 1948   10/08/17 0500  Phosphorus  Tomorrow morning,   R     10/07/17 1948   10/08/17 0500  Procalcitonin  Daily,   R     10/07/17 1952   10/08/17 0500  Creatinine, serum  Daily,   R     10/07/17 2240   10/07/17 1900  Culture, sputum-assessment  Once,   R     10/07/17 1859   10/07/17 1900  Gram stain  Once,   R     10/07/17 1859   10/07/17 1900  HIV antibody  Once,   R     10/07/17 1859   10/07/17 1900  Strep pneumoniae urinary antigen  Once,   R     10/07/17 1859   10/07/17 1850  Troponin I (q 6hr x 3)  Now then every 6 hours,   R     10/07/17 1849   10/07/17 1417  Blood culture (routine x 2)  BLOOD CULTURE X 2,   STAT     10/07/17 1416      Vitals/Pain Today's Vitals   10/07/17 2315 10/07/17 2330 10/07/17 2335 10/07/17 2344  BP: (!) 107/58 (!) 106/56 (!) 106/53   Pulse: 82 83 82   Resp: (!) 26 (!) 26 (!) 26   Temp: (!) 100.4 F (38  C) 100.2 F (37.9 C) 100.2 F (37.9 C)   TempSrc:      SpO2: 99% 98% 97%   Weight:      Height:      PainSc:    0-No pain    Isolation Precautions No active isolations  Medications Medications  aspirin EC tablet 81 mg (not administered)  atorvastatin (LIPITOR) tablet 20 mg (not administered)  azelastine (ASTELIN) 0.1 % nasal spray 1 spray (not administered)  fluticasone (FLONASE) 50 MCG/ACT nasal spray 1 spray (not administered)  acetaminophen (TYLENOL) tablet 650 mg (not administered)  Or  acetaminophen (TYLENOL) suppository 650 mg (not administered)  benzonatate (TESSALON) capsule 100 mg (not administered)  guaiFENesin-codeine 100-10 MG/5ML solution 5 mL (5 mLs Oral Given 10/07/17 1749)  norepinephrine (LEVOPHED) 4 mg in dextrose 5 % 250 mL (0.016 mg/mL) infusion (13 mcg/min Intravenous Rate/Dose Change 10/07/17 2141)  azithromycin (ZITHROMAX) 500 mg in dextrose 5 % 250 mL IVPB (0 mg Intravenous Stopped 10/07/17 2253)  ondansetron (ZOFRAN) injection 4 mg (not administered)  hydrocortisone sodium succinate (SOLU-CORTEF) 100 MG injection 50 mg (not administered)  0.9 %  sodium chloride infusion (not administered)  heparin injection 5,000 Units (5,000 Units Subcutaneous Given 10/07/17 2341)  ipratropium-albuterol (DUONEB) 0.5-2.5 (3) MG/3ML nebulizer solution 3 mL (not administered)  ceFEPIme (MAXIPIME) 1 g in dextrose 5 % 50 mL IVPB (not administered)  vancomycin (VANCOCIN) 500 mg in sodium chloride 0.9 % 100 mL IVPB (not administered)  ceFEPIme (MAXIPIME) 2 g in dextrose 5 % 50 mL IVPB (0 g Intravenous Stopped 10/07/17 1718)  vancomycin (VANCOCIN) IVPB 1000 mg/200 mL premix (0 mg Intravenous Stopped 10/07/17 1606)  ipratropium-albuterol (DUONEB) 0.5-2.5 (3) MG/3ML nebulizer solution 3 mL (3 mLs Nebulization Given 10/07/17 1506)  sodium chloride 0.9 % bolus 1,000 mL (0 mLs Intravenous Stopped 10/07/17 1759)  sodium chloride 0.9 % bolus 1,000 mL (0 mLs Intravenous Stopped 10/07/17 1759)   iopamidol (ISOVUE-370) 76 % injection (100 mLs Intravenous Contrast Given 10/07/17 1634)  ondansetron (ZOFRAN) injection 4 mg (4 mg Intravenous Given 10/07/17 1858)  EPINEPHrine (EPI-PEN) injection 0.3 mg (0.3 mg Intramuscular Given 10/07/17 1809)  methylPREDNISolone sodium succinate (SOLU-MEDROL) 125 mg/2 mL injection 60 mg (60 mg Intravenous Given 10/07/17 1809)  furosemide (LASIX) injection 40 mg (40 mg Intravenous Given 10/07/17 2141)    Mobility walks

## 2017-10-08 NOTE — Progress Notes (Signed)
Pt been refusing her neb tx. RT d/c order. No distress noted at this time.

## 2017-10-09 ENCOUNTER — Inpatient Hospital Stay (HOSPITAL_COMMUNITY): Payer: 59

## 2017-10-09 LAB — CBC WITH DIFFERENTIAL/PLATELET
BASOS ABS: 0 10*3/uL (ref 0.0–0.1)
Basophils Relative: 0 %
EOS PCT: 0 %
Eosinophils Absolute: 0 10*3/uL (ref 0.0–0.7)
HCT: 24.8 % — ABNORMAL LOW (ref 36.0–46.0)
HEMOGLOBIN: 8.4 g/dL — AB (ref 12.0–15.0)
LYMPHS PCT: 5 %
Lymphs Abs: 1.3 10*3/uL (ref 0.7–4.0)
MCH: 30.4 pg (ref 26.0–34.0)
MCHC: 33.9 g/dL (ref 30.0–36.0)
MCV: 89.9 fL (ref 78.0–100.0)
Monocytes Absolute: 1.3 10*3/uL — ABNORMAL HIGH (ref 0.1–1.0)
Monocytes Relative: 5 %
NEUTROS ABS: 21.5 10*3/uL — AB (ref 1.7–7.7)
NEUTROS PCT: 90 %
PLATELETS: 391 10*3/uL (ref 150–400)
RBC: 2.76 MIL/uL — AB (ref 3.87–5.11)
RDW: 14.9 % (ref 11.5–15.5)
WBC: 24.1 10*3/uL — AB (ref 4.0–10.5)

## 2017-10-09 LAB — GLUCOSE, CAPILLARY
GLUCOSE-CAPILLARY: 142 mg/dL — AB (ref 65–99)
Glucose-Capillary: 169 mg/dL — ABNORMAL HIGH (ref 65–99)
Glucose-Capillary: 132 mg/dL — ABNORMAL HIGH (ref 65–99)
Glucose-Capillary: 128 mg/dL — ABNORMAL HIGH (ref 65–99)
Glucose-Capillary: 148 mg/dL — ABNORMAL HIGH (ref 65–99)
Glucose-Capillary: 100 mg/dL — ABNORMAL HIGH (ref 65–99)

## 2017-10-09 LAB — BASIC METABOLIC PANEL WITH GFR
Anion gap: 6 (ref 5–15)
BUN: 18 mg/dL (ref 6–20)
CO2: 25 mmol/L (ref 22–32)
Calcium: 8.6 mg/dL — ABNORMAL LOW (ref 8.9–10.3)
Chloride: 102 mmol/L (ref 101–111)
Creatinine, Ser: 0.83 mg/dL (ref 0.44–1.00)
GFR calc Af Amer: >60 mL/min
GFR calc non Af Amer: >60 mL/min
Glucose, Bld: 171 mg/dL — ABNORMAL HIGH (ref 65–99)
Potassium: 3.7 mmol/L (ref 3.5–5.1)
Sodium: 133 mmol/L — ABNORMAL LOW (ref 135–145)

## 2017-10-09 LAB — MAGNESIUM: Magnesium: 2.1 mg/dL (ref 1.7–2.4)

## 2017-10-09 LAB — HIV ANTIBODY (ROUTINE TESTING W REFLEX): HIV Screen 4th Generation wRfx: NONREACTIVE

## 2017-10-09 LAB — CYCLIC CITRUL PEPTIDE ANTIBODY, IGG/IGA: CCP Antibodies IgG/IgA: 7 U (ref 0–19)

## 2017-10-09 LAB — RHEUMATOID FACTOR: RHEUMATOID FACTOR: 12.5 [IU]/mL (ref 0.0–13.9)

## 2017-10-09 LAB — TROPONIN I: TROPONIN I: 0.15 ng/mL — AB (ref <0.03)

## 2017-10-09 LAB — PHOSPHORUS: Phosphorus: 3 mg/dL (ref 2.5–4.6)

## 2017-10-09 LAB — PROCALCITONIN: Procalcitonin: 0.14 ng/mL

## 2017-10-09 MED ORDER — SODIUM CHLORIDE 0.9 % IV SOLN: 250.0000 mL | INTRAVENOUS | Status: DC | PRN

## 2017-10-09 MED ORDER — INSULIN ASPART 100 UNIT/ML ~~LOC~~ SOLN
0.0000 [IU] | Freq: Three times a day (TID) | SUBCUTANEOUS | Status: DC
  Administered 2017-10-09: 2 [IU] via SUBCUTANEOUS

## 2017-10-09 MED ORDER — SODIUM CHLORIDE 0.9% FLUSH
3.0000 mL | Freq: Two times a day (BID) | INTRAVENOUS | Status: DC
  Administered 2017-10-09 – 2017-10-13 (×9): 3 mL via INTRAVENOUS

## 2017-10-09 MED ORDER — POTASSIUM CHLORIDE CRYS ER 20 MEQ PO TBCR
40.0000 meq | EXTENDED_RELEASE_TABLET | Freq: Once | ORAL | Status: AC
  Administered 2017-10-09: 40 meq via ORAL
  Filled 2017-10-09: qty 2

## 2017-10-09 MED ORDER — ACETAMINOPHEN 325 MG PO TABS
650.0000 mg | ORAL_TABLET | Freq: Once | ORAL | Status: AC
  Administered 2017-10-09: 650 mg via ORAL
  Filled 2017-10-09: qty 2

## 2017-10-09 MED ORDER — SODIUM CHLORIDE 0.9% FLUSH
3.0000 mL | INTRAVENOUS | Status: DC | PRN
Start: 2017-10-09 — End: 2017-10-13

## 2017-10-09 NOTE — Progress Notes (Signed)
CRITICAL VALUE ALERT  Critical Value:  Troponin 0.15  Date & Time Notied:  10/09/2017 1736  Provider Notified: Dr. Bonney Aidamaswamey  Orders Received/Actions taken: no new orders, this result was from 10/08/2017 0050.  A troponin of 0.22 was from 0630, and this result was not called to the RN either.

## 2017-10-09 NOTE — Consult Note (Signed)
Cardiology Consultation:   Patient ID: Hannah Colon; [id number]; 30-Jun-1956   Admit date: 10/07/2017 Date of Consult: 10/09/2017  Primary Care Provider: Burnis Medin, [place] Primary Cardiologist: Chilton Si, MD       Patient Profile:   Hannah Colon is a 62 y.o. female with a hx of diastolic heart failure who is being seen today for the evaluation of consideration of right heart catheterization at the request of Dr. Marchelle Gearing.  History of Present Illness:   Ms. Staples admitted to hospital with complaints of increasing shortness of breath chest discomfort chills.  She had orthopnea but no edema.   Working diagnosis over the last 6 months has been HFpEF and most recent deterioration followed a high salt intake.  Outpatient efforts to adjust medications incompletely successful worsening symptoms prompted hospitalization  Prior evaluation 8/18 an echocardiogram demonstrating normal LV function mild diastolic dysfunction and moderate hypertrophy.  A repeat echo 1/19 severe left ventricular hypertrophy (15) with a mid cavitary cavitary gradient of 19 at rest; systolic anterior motion.  The echo report also suggests a moderate gradient across the aortic valve appears SUB valvular as valve looked normal.  E/E' remains elevated at 30  Pulmonary evaluation is included more chest CT scans have noted worsening  "GG O "findings   She went for contrast associated CT scan that was complicated by hypoxemia chest pain hypotension requiring BiPAP and Levophed.  With frequent hospitalizations the family is quite frustrated    ;  labs also include an elevated sed rate-68 and a chronic anemia with a hemoglobin in the 10 range.  Intermittent leukocytosis    Myoview scan 8/18 showed no ischemia  Past Medical History:  Diagnosis Date  . Diastolic heart failure (HCC)   . Environmental and seasonal allergies   . High cholesterol   . Hypertension   . Hypertensive heart disease  05/13/2017    Past Surgical History:  Procedure Laterality Date  . ABDOMINAL HYSTERECTOMY    . SHOULDER ARTHROSCOPY Right        Inpatient Medications: Scheduled Meds: . aspirin EC  81 mg Oral Daily  . atorvastatin  20 mg Oral QPC breakfast  . azelastine  1 spray Each Nare BID  . Chlorhexidine Gluconate Cloth  6 each Topical Daily  . fluticasone  1 spray Each Nare Daily  . heparin  5,000 Units Subcutaneous Q8H  . insulin aspart  0-15 Units Subcutaneous TID WC  . mouth rinse  15 mL Mouth Rinse BID  . sodium chloride flush  10-40 mL Intracatheter Q12H  . sodium chloride flush  3 mL Intravenous Q12H   Continuous Infusions: . sodium chloride    . sodium chloride    . azithromycin Stopped (10/08/17 1913)  . ceFEPime (MAXIPIME) IV Stopped (10/09/17 1102)   PRN Meds: sodium chloride, sodium chloride, benzonatate, guaiFENesin-codeine, ipratropium-albuterol, sodium chloride flush, sodium chloride flush  Allergies:    Allergies  Allergen Reactions  . Peanut Oil Swelling  . Contrast Media [Iodinated Diagnostic Agents]     hypotension  . Penicillins Hives and Rash    Has patient had a PCN reaction causing immediate rash, facial/tongue/throat swelling, SOB or lightheadedness with hypotension: No Has patient had a PCN reaction causing severe rash involving mucus membranes or skin necrosis: No Has patient had a PCN reaction that required hospitalization: No Has patient had a PCN reaction occurring within the last 10 years: Yes If all of the above answers are "NO", then may proceed with Cephalosporin use.  Social History:   Social History   Socioeconomic History  . Marital status: Married    Spouse name: Not on file  . Number of children: Not on file  . Years of education: Not on file  . Highest education level: Not on file  Social Needs  . Financial resource strain: Not on file  . Food insecurity - worry: Not on file  . Food insecurity - inability: Not on file  .  Transportation needs - medical: Not on file  . Transportation needs - non-medical: Not on file  Occupational History  . Occupation: accounting  Tobacco Use  . Smoking status: Never Smoker  . Smokeless tobacco: Never Used  Substance and Sexual Activity  . Alcohol use: Yes    Alcohol/week: 2.4 - 3.0 oz    Types: 4 - 5 Glasses of wine per week  . Drug use: No  . Sexual activity: Not on file  Other Topics Concern  . Not on file  Social History Narrative  . Not on file    Family History:    Family History  Problem Relation Age of Onset  . Dementia Mother   . Asthma Maternal Grandmother   . Heart failure Maternal Uncle      ROS:  Please see the history of present illness.  ROS  All other ROS reviewed and negative.     Physical Exam/Data:   Vitals:   10/09/17 1300 10/09/17 1400 10/09/17 1500 10/09/17 1600  BP: (!) 108/54 (!) 118/38 (!) 99/55 (!) 111/51  Pulse: 82 (!) 105 94 88  Resp: 15 (!) 33 (!) 28 13  Temp: 98.2 F (36.8 C)     TempSrc:      SpO2: 93% 92% 95% 94%  Weight:      Height:        Intake/Output Summary (Last 24 hours) at 10/09/2017 1626 Last data filed at 10/09/2017 1600 Gross per 24 hour  Intake 1181.3 ml  Output 2800 ml  Net -1618.7 ml   Filed Weights   10/07/17 1153 10/08/17 0200 10/09/17 0500  Weight: 133 lb (60.3 kg) 137 lb 12.6 oz (62.5 kg) 137 lb 2 oz (62.2 kg)   Body mass index is 26.78 kg/m.  General:  Well nourished, well developed, in no acute distress  HEENT: normal Lymph: no adenopathy Neck: 8 cm with HJR Endocrine:  No thryomegaly Vascular: No carotid bruits; FA pulses 2+ bilaterally without bruits  Cardiac:  normal S1, S2; RRR; 2/6 enhance with Valsalva no RV lift Lungs:  clear to auscultation bilaterally, no wheezing, rhonchi or rales  Abd: soft, nontender, no hepatomegaly  Ext: no edema Musculoskeletal:  No deformities, BUE and BLE strength normal and equal Skin: warm and dry  Neuro:  CNs 2-12 intact, no focal abnormalities  noted Psych:  Normal affect   EKG:  The EKG was personally reviewed and demonstrates: Sinus rhythm normal intervals and only minor ST segment scooping Telemetry:  Telemetry was personally reviewed and demonstrates: Normal sinus rhythm  Relevant CV Studies: As above   Laboratory Data:  Chemistry Recent Labs  Lab 10/07/17 1206 10/08/17 0055 10/09/17 0230  NA 132* 135 133*  K 3.7 3.6 3.7  CL 101 102 102  CO2 23 23 25   GLUCOSE 162* 193* 171*  BUN 15 13 18   CREATININE 0.61 0.80 0.83  CALCIUM 9.2 9.0 8.6*  GFRNONAA >60 >60 >60  GFRAA >60 >60 >60  ANIONGAP 8 10 6     No results for input(s): PROT, ALBUMIN,  AST, ALT, ALKPHOS, BILITOT in the last 168 hours. Hematology Recent Labs  Lab 10/07/17 1206 10/08/17 0055 10/09/17 0230  WBC 25.2* 28.3* 24.1*  RBC 3.61* 3.27* 2.76*  HGB 10.6* 9.7* 8.4*  HCT 32.0* 29.0* 24.8*  MCV 88.6 88.7 89.9  MCH 29.4 29.7 30.4  MCHC 33.1 33.4 33.9  RDW 14.4 14.6 14.9  PLT 386 411* 391   Cardiac Enzymes Recent Labs  Lab 10/07/17 1850 10/07/17 1909 10/08/17 0050 10/08/17 0632  TROPONINI 0.04* <0.03 0.15* 0.22*    Recent Labs  Lab 10/02/17 2236 10/07/17 1220 10/07/17 1751  TROPIPOC 0.00 0.00 0.00    BNP Recent Labs  Lab 10/03/17 0026 10/07/17 1206  BNP 162.9* 98.8    DDimer No results for input(s): DDIMER in the last 168 hours.  Radiology/Studies:  Dg Chest 2 View  Result Date: 10/07/2017 CLINICAL DATA:  Central chest pain for 2-3 weeks, has slept upright for past 2 nights, dry cough, shortness of breath, chest pains got very bad early this morning, history hypertension, diastolic heart failure EXAM: CHEST  2 VIEW COMPARISON:  10/02/2017 FINDINGS: Upper normal heart size with slight pulmonary vascular congestion. Mediastinal contours normal. Minimal chronic peribronchial thickening. Lungs clear. No definite infiltrate, pleural effusion or pneumothorax. Bones demineralized with chronic compression deformity of a lower thoracic  vertebra. IMPRESSION: Minimal chronic peribronchial thickening without acute infiltrate. Electronically Signed   By: Ulyses Southward M.D.   On: 10/07/2017 12:14   Ct Angio Chest Pe W Or Wo Contrast  Result Date: 10/07/2017 CLINICAL DATA:  Short of breath and hypoxia. Suspect viral illness. Rule out pulmonary embolism. EXAM: CT ANGIOGRAPHY CHEST WITH CONTRAST TECHNIQUE: Multidetector CT imaging of the chest was performed using the standard protocol during bolus administration of intravenous contrast. Multiplanar CT image reconstructions and MIPs were obtained to evaluate the vascular anatomy. CONTRAST:  ISOVUE-370 IOPAMIDOL (ISOVUE-370) INJECTION 76% COMPARISON:  Plain films of earlier today.  CT of 05/21/2017. FINDINGS: Cardiovascular: The quality of this exam for evaluation of pulmonary embolism is good. No evidence of pulmonary embolism. Bovine arch. Aortic and branch vessel atherosclerosis. Mild cardiomegaly, without pericardial effusion. Mediastinum/Nodes: Borderline enlarged right paratracheal node is similar, 10 mm. subcarinal node measures 1.4 cm on image 43/series 4 versus 1.5 cm on the prior. Upper normal. No hilar adenopathy. Small prevascular nodes are not significantly changed. Lungs/Pleura: No pleural fluid. Again identified is somewhat heterogeneous but diffuse ground-glass and less so airspace opacity. The appearance is relatively similar to 05/21/2017. Upper Abdomen: Normal imaged portions of the liver, spleen, stomach, pancreas, gallbladder, biliary tract, adrenal glands, kidneys. Musculoskeletal: T10 moderate compression deformity is similar. Review of the MIP images confirms the above findings. IMPRESSION: 1.  No evidence of pulmonary embolism. 2. Persistent or recurrent heterogeneous ground-glass and minimal airspace disease bilaterally. Differential considerations include viral/atypical bacterial infection or noncardiogenic (given absence of pleural fluid) pulmonary edema. 3.  Aortic  Atherosclerosis (ICD10-I70.0). Electronically Signed   By: Jeronimo Greaves M.D.   On: 10/07/2017 17:08   Dg Chest Port 1 View  Result Date: 10/09/2017 CLINICAL DATA:  ACUTE RESPIRATORY FAILURE. EXAM: PORTABLE CHEST 1 VIEW COMPARISON:  10/07/2017. FINDINGS: The heart size and mediastinal contours are within normal limits. Aeration is improved with some clearing of BILATERAL pulmonary opacities. The visualized skeletal structures are unremarkable. Central venous catheter lies with its tip in the proximal RIGHT atrium. IMPRESSION: Improved aeration.  No change central venous catheter. Electronically Signed   By: Elsie Stain M.D.   On: 10/09/2017 14:36  Dg Chest Portable 1 View  Result Date: 10/07/2017 CLINICAL DATA:  Initial evaluation for central line placement. EXAM: PORTABLE CHEST 1 VIEW COMPARISON:  Prior radiograph from earlier the same day. FINDINGS: Right IJ approach centra venous catheter has been retracted, with tip now overlying the proximal right atrium. Stable cardiac and mediastinal silhouettes. Lungs hypoinflated. Diffuse vascular congestion with interstitial prominence, favored to reflect edema, similar to previous. No pleural effusion. No new focal infiltrates. No pneumothorax. Osseous structures are unchanged. IMPRESSION: 1. Interval retraction of right IJ approach centra venous catheter, tip now overlying the proximal right atrium. 2. Otherwise stable appearance of the chest with diffuse bilateral airspace disease. Electronically Signed   By: Rise Mu M.D.   On: 10/07/2017 22:26   Dg Chest Portable 1 View  Result Date: 10/07/2017 CLINICAL DATA:  Central line placement EXAM: PORTABLE CHEST 1 VIEW COMPARISON:  Earlier the same day FINDINGS: 2015 hours. Right IJ central line tip projects inferior to the heart, in the region of the intrahepatic IVC. This could be pulled back 10-11 cm for positioning in the region of the SVC/RA junction. After repositioning, tip position could be  confirmed with a repeat chest x-ray. The diffuse bilateral airspace disease is similar to prior. Heart size upper normal and stable. The visualized bony structures of the thorax are intact. Telemetry leads overlie the chest. IMPRESSION: Right IJ central line tip projects inferior to the heart, overlying the IVC. This could be pulled back 10-11 cm for more appropriate positioning and reconfirmed with another chest x-ray. No pneumothorax. Similar appearance diffuse bilateral airspace disease. I personally called this result to Dr. Clarene Duke, in the emergency department, at 2046 hours on 10/07/2017. Electronically Signed   By: Kennith Center M.D.   On: 10/07/2017 20:47   Dg Chest Port 1 View  Result Date: 10/07/2017 CLINICAL DATA:  62 y/o F; hypoxia. Recent hospitalization for congestive heart failure. EXAM: PORTABLE CHEST 1 VIEW COMPARISON:  10/07/2016 chest radiograph and CT chest. FINDINGS: Stable cardiac silhouette given projection and technique. Diffusely increased hazy opacities and reticular opacities of the lungs. No definite pleural effusion. No pneumothorax. Bones are unremarkable IMPRESSION: Diffusely increased hazy and reticular opacities of the lungs may represent pulmonary edema or atypical pneumonia. Electronically Signed   By: Mitzi Hansen M.D.   On: 10/07/2017 18:04    Assessment and Plan:   Dyspnea  Abnormal chest CT scans  Left ventricular hypertrophy-severe systolic anterior motion  ?  Subaortic gradient  Elevated sed rate  Anemia   The patient has a constellation of findings that beg a unifying diagnosis.  From a cardiac point of view, the echo reports are sufficiently discordant but I am so glad that Dr. Jens Som will be seeing this patient tomorrow as his expertise will be valuable in trying to clarify the findings.  Certainly sounds like she has diastolic dysfunction.  It may be that she has hypertrophic cardiomyopathy.  Question is also raises whether there is a  subaortic gradient?  Membrane which might give rise to secondary hypertrophy.  How it is that her groundglass appearance on her CT scans anemia and elevated sed rate all fit together will need to be clarified.  I have reviewed questions suggested by the echo as outlined above.  I suggested that Dr. Ludwig Clarks expertise and potentially alternative imaging modalities might be helpful to clarify    For questions or updates, please contact [facility] Please consult [email or web address] for contact info under Cardiology/STEMI.   Signed, Sherryl Manges, MD  10/09/2017 4:26 PM

## 2017-10-09 NOTE — Consult Note (Signed)
.. ..  Name: Hannah Colon MRN: [id number] DOB: 04/11/1956    ADMISSION DATE:  10/07/2017 CONSULTATION DATE:  10/07/17  REFERRING MD :  Marcelline Deist MD  CHIEF COMPLAINT:  SOB   SIGNIFICANT EVENTS  Started Levophed ggt  STUDIES:  CT PE study    BRIEF 62 yr old female with PMHx of HFpEF, HTN, HLD, who states that until a few weeks ago she was in her usual state of health. Then she reports eating some fried [place] with other indulgent foods and was seen by her Cardiologist and required increased doses of her medications especially her diuretics.  Per her chart she was seen in the Office on 12/28 and in the ED 12/30.  For the last couple of days she has had a cough with worsening chest discomfort, chills but denies fevers states that her pulse ox per her Samsung phone was 86-87% Sat ( it typically runs 3 pts below hospital pulse ox). She denies leg swelling but c/o orthopnea. Initially admitted to Hospitalist service. Pt went for CT scan and when she returned she had worsening dyspnea, chest pain, emesis and desaturating. Her MAP dropped 38mHg and patient was placed on NRB mask and started on  Levophed ggt via peripheral IV. 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 Still on low dose levophed. CVP low.  Hx retake: endorses dyspnea since aug 2018. Intermittent. Stress test Aug 2018 normal. Echo Aug 2018: Gr1 diast dysfn. Got better in aug 2018 but then worse again over holidays. 2nd admit since 10/03/17 and 4th since august. Now with new hypoxemic reso failure 5L [place]. CT augu 2018 -> 10/07/2017 wrsening GGO. Radiology questioning primary lung issues. She wants testing for celiac disease due to famil hx (aunt and cousin)   SUBJECTIVE/OVERNIGHT/INTERVAL HX 10/09/17 - off levophed. OFf oxygen x 30 min  -> pulse ox 91-93% with hhydrocort given for post CTA dye shock. RVP negative, PCT 0.14. Urine strep negative. Fever/WBC improved. Sed  rate 68. Husband at bedside. Lot of questins,. Reports opd rheum appt later this month at [facility].    ECHO 10/08/17  - ef 65%, Gr 2 diast dysfn and evidence of subvalvular gradient  CXR 10/09/17 - looks improved to my personal visulaization (Rx hydrocort. But not lasix)  VITAL SIGNS: Temp:  [97.3 F (36.3 C)-99 F (37.2 C)] 98.2 F (36.8 C) (01/06 1200) Pulse Rate:  [64-103] 86 (01/06 1200) Resp:  [15-26] 15 (01/06 1200) BP: (88-146)/(32-65) 103/48 (01/06 1200) SpO2:  [92 %-100 %] 93 % (01/06 1200) Weight:  [62.2 kg (137 lb 2 oz)] 62.2 kg (137 lb 2 oz) (01/06 0500)  PHYSICAL EXAMINATION:  General Appearance:    Looks better.  OBESE - +  Head:    Normocephalic, without obvious abnormality, atraumatic  Eyes:    PERRL - yes, conjunctiva/corneas - clea      Ears:    Normal external ear canals, both ears  Nose:   NG tube - no  Throat:  ETT TUBE - no , OG tube - no  Neck:   Supple,  No enlargement/tenderness/nodules     Lungs:     Mild crackles + ,. No distress.  On Room air  Chest wall:    No deformity  Heart:    S1 and S2 normal, no murmur, CVP - no.  Pressors - no  Abdomen:     Soft, no masses, no organomegaly  Genitalia:  Not done  Rectal:   not done  Extremities:   Extremities- intact. No edema     Skin:   Intact in exposed areas . Sacral area - intact     Neurologic:   Sedation - none -> RASS - +1 . Moves all 4s - yes. CAM-ICU - neg . Orientation - x3+       PULMONARY Recent Labs  Lab 10/07/17 2330  O2SAT 77.2    CBC Recent Labs  Lab 10/07/17 1206 10/08/17 0055 10/09/17 0230  HGB 10.6* 9.7* 8.4*  HCT 32.0* 29.0* 24.8*  WBC 25.2* 28.3* 24.1*  PLT 386 411* 391    COAGULATION No results for input(s): INR in the last 168 hours.  CARDIAC   Recent Labs  Lab 10/07/17 1850 10/07/17 1909 10/08/17 0050 10/08/17 0632  TROPONINI 0.04* <0.03 0.15* 0.22*   No results for input(s): PROBNP in the last 168 hours.   CHEMISTRY Recent Labs  Lab 10/02/17 2229  10/03/17 1113 10/04/17 0515 10/07/17 1206 10/08/17 0055 10/09/17 0230  NA 135  --  137 132* 135 133*  K 3.6  --  3.8 3.7 3.6 3.7  CL 100*  --  100* 101 102 102  CO2 26  --  _0 GLUCOSE 119*  --  109* 162* 193* 171*  BUN 16  --  _1 CREATININE 0.81 0.65 0.70 0.61 0.80 0.83  CALCIUM 9.5  --  9.6 9.2 9.0 8.6*  MG  --  1.9  --   --  1.7 2.1  PHOS  --   --   --   --  4.4 3.0   Estimated Creatinine Clearance: 59.4 mL/min (by C-G formula based on SCr of 0.83 mg/dL).   LIVER No results for input(s): AST, ALT, ALKPHOS, BILITOT, PROT, ALBUMIN, INR in the last 168 hours.   INFECTIOUS Recent Labs  Lab 10/07/17 1454 10/07/17 1909 10/07/17 1953 10/07/17 2139 10/08/17 0055 10/09/17 0230  LATICACIDVEN 1.11 2.7*  --  1.2  --   --   PROCALCITON  --   --  0.13  --  0.20 0.14     ENDOCRINE CBG (last 3)  Recent Labs    10/09/17 0329 10/09/17 0748 10/09/17 1201  GLUCAP 169* 132* 128*         IMAGING x48h  - image(s) personally visualized  -   highlighted in bold Ct Angio Chest Pe W Or Wo Contrast  Result Date: 10/07/2017 CLINICAL DATA:  Short of breath and hypoxia. Suspect viral illness. Rule out pulmonary embolism. EXAM: CT ANGIOGRAPHY CHEST WITH CONTRAST TECHNIQUE: Multidetector CT imaging of the chest was performed using the standard protocol during bolus administration of intravenous contrast. Multiplanar CT image reconstructions and MIPs were obtained to evaluate the vascular anatomy. CONTRAST:  163m ISOVUE-370 IOPAMIDOL (ISOVUE-370) INJECTION 76% COMPARISON:  Plain films of earlier today.  CT of 05/21/2017. FINDINGS: Cardiovascular: The quality of this exam for evaluation of pulmonary embolism is good. No evidence of pulmonary embolism. Bovine arch. Aortic and branch vessel atherosclerosis. Mild cardiomegaly, without pericardial effusion. Mediastinum/Nodes: Borderline enlarged right paratracheal node is similar, 10 mm. subcarinal node measures 1.4 cm on image  43/series 4 versus 1.5 cm on the prior. Upper normal. No hilar adenopathy. Small prevascular nodes are not significantly changed. Lungs/Pleura: No pleural fluid. Again identified is somewhat heterogeneous but diffuse ground-glass and less so airspace opacity. The appearance is relatively similar to 05/21/2017. Upper Abdomen: Normal imaged portions of the liver, spleen,  stomach, pancreas, gallbladder, biliary tract, adrenal glands, kidneys. Musculoskeletal: T10 moderate compression deformity is similar. Review of the MIP images confirms the above findings. IMPRESSION: 1.  No evidence of pulmonary embolism. 2. Persistent or recurrent heterogeneous ground-glass and minimal airspace disease bilaterally. Differential considerations include viral/atypical bacterial infection or noncardiogenic (given absence of pleural fluid) pulmonary edema. 3.  Aortic Atherosclerosis (ICD10-I70.0). Electronically Signed   By: Abigail Miyamoto M.D.   On: 10/07/2017 17:08   Dg Chest Portable 1 View  Result Date: 10/07/2017 CLINICAL DATA:  Initial evaluation for central line placement. EXAM: PORTABLE CHEST 1 VIEW COMPARISON:  Prior radiograph from earlier the same day. FINDINGS: Right IJ approach centra venous catheter has been retracted, with tip now overlying the proximal right atrium. Stable cardiac and mediastinal silhouettes. Lungs hypoinflated. Diffuse vascular congestion with interstitial prominence, favored to reflect edema, similar to previous. No pleural effusion. No new focal infiltrates. No pneumothorax. Osseous structures are unchanged. IMPRESSION: 1. Interval retraction of right IJ approach centra venous catheter, tip now overlying the proximal right atrium. 2. Otherwise stable appearance of the chest with diffuse bilateral airspace disease. Electronically Signed   By: Jeannine Boga M.D.   On: 10/07/2017 22:26   Dg Chest Portable 1 View  Result Date: 10/07/2017 CLINICAL DATA:  Central line placement EXAM: PORTABLE  CHEST 1 VIEW COMPARISON:  Earlier the same day FINDINGS: 2015 hours. Right IJ central line tip projects inferior to the heart, in the region of the intrahepatic IVC. This could be pulled back 10-11 cm for positioning in the region of the SVC/RA junction. After repositioning, tip position could be confirmed with a repeat chest x-ray. The diffuse bilateral airspace disease is similar to prior. Heart size upper normal and stable. The visualized bony structures of the thorax are intact. Telemetry leads overlie the chest. IMPRESSION: Right IJ central line tip projects inferior to the heart, overlying the IVC. This could be pulled back 10-11 cm for more appropriate positioning and reconfirmed with another chest x-ray. No pneumothorax. Similar appearance diffuse bilateral airspace disease. I personally called this result to Dr. Rex Kras, in the emergency department, at 2046 hours on 10/07/2017. Electronically Signed   By: Misty Stanley M.D.   On: 10/07/2017 20:47   Dg Chest Port 1 View  Result Date: 10/07/2017 CLINICAL DATA:  62 y/o F; hypoxia. Recent hospitalization for congestive heart failure. EXAM: PORTABLE CHEST 1 VIEW COMPARISON:  10/07/2016 chest radiograph and CT chest. FINDINGS: Stable cardiac silhouette given projection and technique. Diffusely increased hazy opacities and reticular opacities of the lungs. No definite pleural effusion. No pneumothorax. Bones are unremarkable IMPRESSION: Diffusely increased hazy and reticular opacities of the lungs may represent pulmonary edema or atypical pneumonia. Electronically Signed   By: Kristine Garbe M.D.   On: 10/07/2017 18:04        ASSESSMENT / PLAN: NEURO:    10/09/2017  -> intact   PLAN monitor  CARDIAC: In circulatory shock post CTA Mild trop leak   10/09/17 - ECHO - gr 2 diast dysfn + subvalv gradient (done on leveophed 10/08/17) but shock now resolved with hydrocort and fluid bolus. CXR improved in my personal visualization (report  pending) with fluid bolus/hydrocort . Did not get lasix   Plan [place] Cards consult - ? Need right heart cath to sort out ILD v cardiac pulm congestion    PULMONARY: Respiratory Insufficiency Acute hypoxemic resp failoure: Diffus GGO on CT and seems same progress since aug 2018 but now worse. Denies mold or  bird exposure at home. Radiology questions non-cardiogenic pulm edema.    10/09/17 - RVP negative, Urine strep negative. Fever improved. WEaned off to room air  PLAN Await autoimmune 10/09/17  ? need right heart cath to differential HEart v Lung issue If all of above negative, consider bronch with BAL +/- Tbbx For now , o2 for pusle ox > 88% Pulm toilet  Endocrine: No H/o insulin dependence- last HgbA1c 5.5  PLAN await TSH and Cortisol level  GI: Oral diet hearrt healthy   ID  - unclear if this is infectiojs - low grade PCT suggests possible localized precoeess   LAN abx for now Track cultures    Heme: Microcytic Anemia  Hgb 10.6 No signs of active bleeding Pltys noted to be 386 If Hgb<7 transfuse PRBCs No PE on study and no h/o coagulopathy DVT PPx-> Hep [place] and SCDs  RENAL Nil acute  Plan monitor   FMAILY: husband and patient updated in details   [place] to move to tele AMbulate with assist; eye  On pulse ox    Dr. Brand Males, M.D., [facility].C.P Pulmonary and Critical Care Medicine Staff Physician [place] [facility] Pager: [phone], If no answer or between  15:00h - 7:00h: call [phone]  10/09/2017 1:10 PM

## 2017-10-10 ENCOUNTER — Ambulatory Visit: Payer: 59 | Admitting: Adult Health

## 2017-10-10 DIAGNOSIS — J9621 Acute and chronic respiratory failure with hypoxia: Secondary | ICD-10-CM

## 2017-10-10 DIAGNOSIS — I421 Obstructive hypertrophic cardiomyopathy: Secondary | ICD-10-CM

## 2017-10-10 LAB — CBC WITH DIFFERENTIAL/PLATELET
Basophils Absolute: 0 10*3/uL (ref 0.0–0.1)
Basophils Relative: 0 %
EOS PCT: 1 %
Eosinophils Absolute: 0.1 10*3/uL (ref 0.0–0.7)
HCT: 24.3 % — ABNORMAL LOW (ref 36.0–46.0)
Hemoglobin: 7.9 g/dL — ABNORMAL LOW (ref 12.0–15.0)
LYMPHS PCT: 21 %
Lymphs Abs: 2.5 10*3/uL (ref 0.7–4.0)
MCH: 29.6 pg (ref 26.0–34.0)
MCHC: 32.5 g/dL (ref 30.0–36.0)
MCV: 91 fL (ref 78.0–100.0)
MONO ABS: 1 10*3/uL (ref 0.1–1.0)
MONOS PCT: 9 %
Neutro Abs: 8.1 10*3/uL — ABNORMAL HIGH (ref 1.7–7.7)
Neutrophils Relative %: 69 %
PLATELETS: 371 10*3/uL (ref 150–400)
RBC: 2.67 MIL/uL — AB (ref 3.87–5.11)
RDW: 15.2 % (ref 11.5–15.5)
WBC: 11.7 10*3/uL — ABNORMAL HIGH (ref 4.0–10.5)

## 2017-10-10 LAB — BASIC METABOLIC PANEL
Anion gap: 6 (ref 5–15)
BUN: 20 mg/dL (ref 6–20)
CO2: 24 mmol/L (ref 22–32)
CREATININE: 0.73 mg/dL (ref 0.44–1.00)
Calcium: 8.7 mg/dL — ABNORMAL LOW (ref 8.9–10.3)
Chloride: 107 mmol/L (ref 101–111)
GFR calc non Af Amer: >60 mL/min (ref >60)
Glucose, Bld: 99 mg/dL (ref 65–99)
Potassium: 4 mmol/L (ref 3.5–5.1)
Sodium: 137 mmol/L (ref 135–145)

## 2017-10-10 LAB — GLOMERULAR BASEMENT MEMBRANE ANTIBODIES: GBM Ab: 3 units (ref 0–20)

## 2017-10-10 LAB — ANTINUCLEAR ANTIBODIES, IFA: ANA Ab, IFA: NEGATIVE

## 2017-10-10 LAB — ANTI-SCLERODERMA ANTIBODY

## 2017-10-10 LAB — SJOGRENS SYNDROME-A EXTRACTABLE NUCLEAR ANTIBODY

## 2017-10-10 LAB — GLUCOSE, CAPILLARY
GLUCOSE-CAPILLARY: 89 mg/dL (ref 65–99)
GLUCOSE-CAPILLARY: 100 mg/dL — AB (ref 65–99)
Glucose-Capillary: 87 mg/dL (ref 65–99)
Glucose-Capillary: 113 mg/dL — ABNORMAL HIGH (ref 65–99)

## 2017-10-10 LAB — MPO/PR-3 (ANCA) ANTIBODIES
ANCA Proteinase 3: <3.5 U/mL (ref 0.0–3.5)
Myeloperoxidase Abs: <9 U/mL (ref 0.0–9.0)

## 2017-10-10 LAB — ANTI-DNA ANTIBODY, DOUBLE-STRANDED

## 2017-10-10 LAB — PHOSPHORUS: Phosphorus: 2.1 mg/dL — ABNORMAL LOW (ref 2.5–4.6)

## 2017-10-10 LAB — SJOGRENS SYNDROME-B EXTRACTABLE NUCLEAR ANTIBODY: SSB (La) (ENA) Antibody, IgG: <0.2 AI (ref 0.0–0.9)

## 2017-10-10 LAB — MAGNESIUM: Magnesium: 1.9 mg/dL (ref 1.7–2.4)

## 2017-10-10 LAB — ALDOLASE: Aldolase: 17.4 U/L — ABNORMAL HIGH (ref 3.3–10.3)

## 2017-10-10 MED ORDER — ACETAMINOPHEN 325 MG PO TABS
650.0000 mg | ORAL_TABLET | ORAL | Status: DC | PRN
  Administered 2017-10-10 – 2017-10-13 (×2): 650 mg via ORAL
  Filled 2017-10-10 (×2): qty 2

## 2017-10-10 MED ORDER — SODIUM CHLORIDE 0.9% FLUSH: 10.0000 mL | INTRAVENOUS | Status: DC | PRN

## 2017-10-10 MED ORDER — METOPROLOL TARTRATE 25 MG PO TABS
25.0000 mg | ORAL_TABLET | Freq: Two times a day (BID) | ORAL | Status: DC
  Administered 2017-10-10 (×2): 25 mg via ORAL
  Filled 2017-10-10 (×2): qty 1

## 2017-10-10 NOTE — Progress Notes (Signed)
Pharmacy Antibiotic Note  Hannah Colon is a 62 y.o. female admitted on 10/07/2017 with respiratory failure, r/o pneumonia.  Pharmacy has been consulted for cefepime dosing.  Today, 10/10/2017: Day #4 antibiotics WBC 11.7 ( solucortef d/c 1/6) Afebrile PCT 0.20>0.14  Plan: Continue Cefepime 1 gram q8h Azithromycin 500 mg IV q24h per MD Follow renal function, cultures, clinical course  Height: 5' (152.4 cm) Weight: 150 lb 12.7 oz (68.4 kg) IBW/kg (Calculated) : 45.5  Temp (24hrs), Avg:98.3 F (36.8 C), Min:97.8 F (36.6 C), Max:98.6 F (37 C)  Recent Labs  Lab 10/03/17 1113 10/04/17 0515 10/07/17 1206 10/07/17 1454 10/07/17 1909 10/07/17 2139 10/08/17 0055 10/09/17 0230 10/10/17 0446  WBC 9.6  --  25.2*  --   --   --  28.3* 24.1* 11.7*  CREATININE 0.65 0.70 0.61  --   --   --  0.80 0.83 0.73  LATICACIDVEN  --   --   --  1.11 2.7* 1.2  --   --   --     Estimated Creatinine Clearance: 64.6 mL/min (by C-G formula based on SCr of 0.73 mg/dL).    Allergies  Allergen Reactions  . Peanut Oil Swelling  . Contrast Media [Iodinated Diagnostic Agents]     hypotension  . Penicillins Hives and Rash    Has patient had a PCN reaction causing immediate rash, facial/tongue/throat swelling, SOB or lightheadedness with hypotension: No Has patient had a PCN reaction causing severe rash involving mucus membranes or skin necrosis: No Has patient had a PCN reaction that required hospitalization: No Has patient had a PCN reaction occurring within the last 10 years: Yes If all of the above answers are "NO", then may proceed with Cephalosporin use.     Antimicrobials this admission: 1/4 azithro IV >> 1/4 cefepime >> 1/4 vancomycin >> 1/5  Dose adjustments this admission:  Microbiology results: 1/4  BCx: ngtd 1/4 flu neg 1/4 HIV neg 1/5 MRSA PCR: negative 1/5 Respiratory panel: none detected  Thank you for allowing pharmacy to be a part of this patient's care.  Lynann Beaverhristine  Vaneza Pickart PharmD, BCPS Pager [phone] 10/10/2017 10:06 AM

## 2017-10-10 NOTE — Progress Notes (Signed)
.. ..  Name: Hannah Colon MRN: [id number] DOB: Feb 03, 1956    ADMISSION DATE:  10/07/2017 CONSULTATION DATE:  10/07/17  REFERRING MD :  Marcelline Deist MD  CHIEF COMPLAINT:  SOB   SIGNIFICANT EVENTS  Started Levophed ggt  STUDIES:  CT PE study    BRIEF 62 yr old female with PMHx of HFpEF, HTN, HLD, who states that until a few weeks ago she was in her usual state of health. Then she reports eating some fried [place] with other indulgent foods and was seen by her Cardiologist and required increased doses of her medications especially her diuretics.  Per her chart she was seen in the Office on 12/28 and in the ED 12/30.  For the last couple of days she has had a cough with worsening chest discomfort, chills but denies fevers states that her pulse ox per her [employer] phone was 86-87% Sat ( it typically runs 3 pts below hospital pulse ox). She denies leg swelling but c/o orthopnea. Initially admitted to Hospitalist service. Pt went for CT scan and when she returned she had worsening dyspnea, chest pain, emesis and desaturating. Her MAP dropped 50mHg and patient was placed on NRB mask and started on  Levophed ggt via peripheral IV. 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 Still on low dose levophed. CVP low.  Hx retake: endorses dyspnea since aug 2018. Intermittent. Stress test Aug 2018 normal. Echo Aug 2018: Gr1 diast dysfn. Got better in aug 2018 but then worse again over holidays. 2nd admit since 10/03/17 and 4th since august. Now with new hypoxemic reso failure 5L [place]. CT augu 2018 -> 10/07/2017 wrsening GGO. Radiology questioning primary lung issues. She wants testing for celiac disease due to famil hx (aunt and cousin)   SUBJECTIVE/OVERNIGHT/INTERVAL HX No distress, does have a little more orthopnea and mild cough today  VITAL SIGNS: Temp:  [97.8 F (36.6 C)-98.6 F (37 C)] 98.5 F (36.9 C) (01/07 0553) Pulse Rate:   [73-105] 73 (01/07 0553) Resp:  [13-33] 20 (01/07 0553) BP: (96-145)/(38-76) 117/54 (01/07 0553) SpO2:  [91 %-100 %] 91 % (01/07 0553) Weight:  [137 lb 5.6 oz (62.3 kg)-150 lb 12.7 oz (68.4 kg)] 150 lb 12.7 oz (68.4 kg) (01/07 0500) Room air PHYSICAL EXAMINATION:  General: 62year old white female resting comfortably in bed no acute distress HEENT: Normocephalic atraumatic no jugular venous distention Pulmonary: Clear to auscultation without accessory use Cardiac: Regular rate and rhythm with holosystolic murmur Abdomen: Soft nontender no organomegaly positive bowel sounds tolerating diet Extremities/musculoskeletal warm dry brisk cap refill no edema Neuro/psych awake oriented no focal deficits    PULMONARY Recent Labs  Lab 10/07/17 2330  O2SAT 77.2    CBC Recent Labs  Lab 10/08/17 0055 10/09/17 0230 10/10/17 0446  HGB 9.7* 8.4* 7.9*  HCT 29.0* 24.8* 24.3*  WBC 28.3* 24.1* 11.7*  PLT 411* 391 371    COAGULATION No results for input(s): INR in the last 168 hours.  CARDIAC   Recent Labs  Lab 10/07/17 1850 10/07/17 1909 10/08/17 0050 10/08/17 0632  TROPONINI 0.04* <0.03 0.15* 0.22*   No results for input(s): PROBNP in the last 168 hours.   CHEMISTRY Recent Labs  Lab 10/03/17 1113  10/04/17 0515 10/07/17 1206 10/08/17 0055 10/09/17 0230 10/10/17 0446  NA  --   --  137 132* 135 133* 137  K  --    < > 3.8 3.7 3.6 3.7 4.0  CL  --   --  100* 101 102 102 107  CO2  --   --  _0 GLUCOSE  --   --  109* 162* 193* 171* 99  BUN  --   --  _1 CREATININE 0.65  --  0.70 0.61 0.80 0.83 0.73  CALCIUM  --   --  9.6 9.2 9.0 8.6* 8.7*  MG 1.9  --   --   --  1.7 2.1 1.9  PHOS  --   --   --   --  4.4 3.0 2.1*   < > = values in this interval not displayed.   Estimated Creatinine Clearance: 64.6 mL/min (by C-G formula based on SCr of 0.73 mg/dL).   LIVER No results for input(s): AST, ALT, ALKPHOS, BILITOT, PROT, ALBUMIN, INR in the last 168  hours.   INFECTIOUS Recent Labs  Lab 10/07/17 1454 10/07/17 1909 10/07/17 1953 10/07/17 2139 10/08/17 0055 10/09/17 0230  LATICACIDVEN 1.11 2.7*  --  1.2  --   --   PROCALCITON  --   --  0.13  --  0.20 0.14     ENDOCRINE CBG (last 3)  Recent Labs    10/09/17 1707 10/09/17 2107 10/10/17 0744  GLUCAP 142* 100* 87       IMAGING x48h  - image(s) personally visualized  -   highlighted in bold Dg Chest Port 1 View  Result Date: 10/09/2017 CLINICAL DATA:  ACUTE RESPIRATORY FAILURE. EXAM: PORTABLE CHEST 1 VIEW COMPARISON:  10/07/2017. FINDINGS: The heart size and mediastinal contours are within normal limits. Aeration is improved with some clearing of BILATERAL pulmonary opacities. The visualized skeletal structures are unremarkable. Central venous catheter lies with its tip in the proximal RIGHT atrium. IMPRESSION: Improved aeration.  No change central venous catheter. Electronically Signed   By: Staci Righter M.D.   On: 10/09/2017 14:36        ASSESSMENT / PLAN:  Shock (presume anaphylaxis from CT dye) but also ? 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  Plan Cards starting low dose bb Assess for diuretic needs (but being mindful that this could exacerbate hypotension w/ HOCM) No ARB Will eventually need cardiac MRI Avoid anemia as will excarbate HOCM; will reevaluate CBC  Respiratory Insufficiency Acute hypoxemic resp failoure: Diffus GGO on CT ?  progress since aug 2018 but now worse. Denies mold or bird exposure at home. Radiology questions non-cardiogenic pulm edema.   10/09/17 - RVP negative, Urine strep negative. Fever improved. Weaned off to room air CCP antibodies: Negative Sed rate 60 GBM antibody: Negative Rheumatoid factor 12.5 Respiratory viral panel  negative Plan Continue pulse oximetry Follow-up autoimmune panel as well as hypersensitivity pneumonitis panel follow-up outpatient in regards to further ILD workup; may need BAL and possible transbronchial biopsy  Microcytic Anemia  Plan Trend CBC, may need transfusion    Seems like her biggest issue right now is her cardiac function and need to identify an ideal medication regimen for her HOCM.  Started on beta-blockade today, has developed mild cough and some orthopnea which she did not have on 1/6.  Hoping adding beta-blockade to optimize cardiac function and symptoms will improve, may need to reevaluate diuretics however the risk of this carries as it could  exacerbate her HOCM physiology, she will need a couple more days telemetry monitoring as we titrate her ideal cardiac regimen.  If symptoms cannot be managed may require myomectomy.  We need to evaluate her ILD issues further but her cardiac issues currently need to be at the forefront.  We will make a follow-up in our office   Erick Colace ACNP-BC [place] Pager # [phone] OR # [phone] if no answer   10:24 AM

## 2017-10-10 NOTE — Progress Notes (Signed)
Progress Note  Patient Name: Pamalee Marcoe Date of Encounter: 10/10/2017  Primary Cardiologist: Chilton Si, MD   Subjective   Mild dyspnea this AM; no CP  Inpatient Medications    Scheduled Meds: . aspirin EC  81 mg Oral Daily  . atorvastatin  20 mg Oral QPC breakfast  . azelastine  1 spray Each Nare BID  . Chlorhexidine Gluconate Cloth  6 each Topical Daily  . fluticasone  1 spray Each Nare Daily  . heparin  5,000 Units Subcutaneous Q8H  . insulin aspart  0-15 Units Subcutaneous TID WC  . mouth rinse  15 mL Mouth Rinse BID  . sodium chloride flush  10-40 mL Intracatheter Q12H  . sodium chloride flush  3 mL Intravenous Q12H   Continuous Infusions: . sodium chloride    . sodium chloride    . azithromycin Stopped (10/09/17 1803)  . ceFEPime (MAXIPIME) IV 1 g (10/10/17 0900)   PRN Meds: sodium chloride, sodium chloride, acetaminophen, benzonatate, guaiFENesin-codeine, ipratropium-albuterol, sodium chloride flush, sodium chloride flush, sodium chloride flush   Vital Signs    Vitals:   10/09/17 2210 10/10/17 0200 10/10/17 0500 10/10/17 0553  BP: (!) 145/68 124/76  (!) 117/54  Pulse: 87 74  73  Resp: 20 20  20   Temp: 98.6 F (37 C) 98.6 F (37 C)  98.5 F (36.9 C)  TempSrc: Oral Oral  Oral  SpO2: 100% 95%  91%  Weight: 137 lb 5.6 oz (62.3 kg)  150 lb 12.7 oz (68.4 kg)   Height: 5' (1.524 m)       Intake/Output Summary (Last 24 hours) at 10/10/2017 0922 Last data filed at 10/10/2017 0900 Gross per 24 hour  Intake 1090 ml  Output 2700 ml  Net -1610 ml   Filed Weights   10/09/17 0500 10/09/17 2210 10/10/17 0500  Weight: 137 lb 2 oz (62.2 kg) 137 lb 5.6 oz (62.3 kg) 150 lb 12.7 oz (68.4 kg)    Telemetry    Sinus- Personally Reviewed  Physical Exam   GEN: No acute distress.   Neck: No JVD Cardiac: RRR, 3/6 systolic murmur that increases with valsalva Respiratory: Clear to auscultation bilaterally. GI: Soft, nontender, non-distended  MS: No edema;  No deformity. Neuro:  Nonfocal  Psych: Normal affect   Labs    Chemistry Recent Labs  Lab 10/08/17 0055 10/09/17 0230 10/10/17 0446  NA 135 133* 137  K 3.6 3.7 4.0  CL 102 102 107  CO2 23 25 24   GLUCOSE 193* 171* 99  BUN 13 18 20   CREATININE 0.80 0.83 0.73  CALCIUM 9.0 8.6* 8.7*  GFRNONAA >60 >60 >60  GFRAA >60 >60 >60  ANIONGAP 10 6 6      Hematology Recent Labs  Lab 10/08/17 0055 10/09/17 0230 10/10/17 0446  WBC 28.3* 24.1* 11.7*  RBC 3.27* 2.76* 2.67*  HGB 9.7* 8.4* 7.9*  HCT 29.0* 24.8* 24.3*  MCV 88.7 89.9 91.0  MCH 29.7 30.4 29.6  MCHC 33.4 33.9 32.5  RDW 14.6 14.9 15.2  PLT 411* 391 371    Cardiac Enzymes Recent Labs  Lab 10/07/17 1850 10/07/17 1909 10/08/17 0050 10/08/17 0632  TROPONINI 0.04* <0.03 0.15* 0.22*    Recent Labs  Lab 10/07/17 1220 10/07/17 1751  TROPIPOC 0.00 0.00     BNP Recent Labs  Lab 10/07/17 1206  BNP 98.8     Radiology    Dg Chest Port 1 View  Result Date: 10/09/2017 CLINICAL DATA:  ACUTE RESPIRATORY FAILURE. EXAM: PORTABLE  CHEST 1 VIEW COMPARISON:  10/07/2017. FINDINGS: The heart size and mediastinal contours are within normal limits. Aeration is improved with some clearing of BILATERAL pulmonary opacities. The visualized skeletal structures are unremarkable. Central venous catheter lies with its tip in the proximal RIGHT atrium. IMPRESSION: Improved aeration.  No change central venous catheter. Electronically Signed   By: Elsie StainJohn T Curnes M.D.   On: 10/09/2017 14:36    Patient Profile     62 y.o. female admitted with acute on chronic diastolic congestive heart failure.  Echocardiogram shows hyperdynamic left ventricular function, severe proximal septal thickening, grade 2 diastolic dysfunction, left ventricular outflow tract gradient, severe SAM and probable moderate mitral regurgitation.  Assessment & Plan    1 acute on chronic diastolic congestive heart failure-I have personally reviewed the patient's  transthoracic echocardiogram.  She has severe proximal septal thickening with severe systolic anterior motion of the mitral valve.  She has an outflow gradient.  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  I had a long discussion with patient and husband today concerning the physiology of this.  I spent greater than 30 minutes time with them this morning.  She became hypotensive previously following vigorous diuresis.  This likely exacerbated her left ventricular outflow tract gradient.  I will resume metoprolol 25 mg twice daily.  Advance beta-blocker as tolerated by blood pressure.  Hopefully this will improve left ventricular outflow tract gradient.  She may also need low-dose diuretic as tolerated but we need to be careful as again this will exacerbate left ventricular outflow tract gradient.  I would avoid ARB as decreased afterload will also exacerbate HOCM physiology.  After she improves would likely plan cardiac MRI.  If she does not improve with medical therapy would need to consider myectomy.  We discussed the importance of low-sodium diet.  Also note her anemia will worsen her outflow tract gradient.  2 hypertension-we will treat blood pressure with beta blockade.  Avoid ARB as outlined above.  3 anemia-further evaluation per primary care.  As described above this will make her outflow tract gradient worse.  For questions or updates, please contact [facility] Please consult [email or web address] for contact info under Cardiology/STEMI.      Signed, Olga MillersBrian Ivalene Platte, MD  10/10/2017, 9:22 AM

## 2017-10-11 ENCOUNTER — Encounter (HOSPITAL_COMMUNITY): Payer: Self-pay

## 2017-10-11 LAB — IRON AND TIBC
Iron: 44 ug/dL (ref 28–170)
Saturation Ratios: 13 % (ref 10.4–31.8)
TIBC: 344 ug/dL (ref 250–450)
UIBC: 300 ug/dL

## 2017-10-11 LAB — CBC WITH DIFFERENTIAL/PLATELET
Basophils Absolute: 0.1 10*3/uL (ref 0.0–0.1)
Basophils Relative: 1 %
Eosinophils Absolute: 0.4 10*3/uL (ref 0.0–0.7)
Eosinophils Relative: 5 %
HEMATOCRIT: 26.7 % — AB (ref 36.0–46.0)
Hemoglobin: 8.9 g/dL — ABNORMAL LOW (ref 12.0–15.0)
LYMPHS PCT: 27 %
Lymphs Abs: 2.2 10*3/uL (ref 0.7–4.0)
MCH: 30.5 pg (ref 26.0–34.0)
MCHC: 33.3 g/dL (ref 30.0–36.0)
MCV: 91.4 fL (ref 78.0–100.0)
MONO ABS: 0.8 10*3/uL (ref 0.1–1.0)
MONOS PCT: 10 %
NEUTROS ABS: 4.7 10*3/uL (ref 1.7–7.7)
Neutrophils Relative %: 57 %
Platelets: 422 10*3/uL — ABNORMAL HIGH (ref 150–400)
RBC: 2.92 MIL/uL — ABNORMAL LOW (ref 3.87–5.11)
RDW: 15.2 % (ref 11.5–15.5)
WBC: 8.2 10*3/uL (ref 4.0–10.5)

## 2017-10-11 LAB — CREATININE, SERUM
Creatinine, Ser: 0.55 mg/dL (ref 0.44–1.00)
GFR calc Af Amer: >60 mL/min (ref >60)
GFR calc non Af Amer: >60 mL/min (ref >60)

## 2017-10-11 LAB — GLUCOSE, CAPILLARY
GLUCOSE-CAPILLARY: 116 mg/dL — AB (ref 65–99)
GLUCOSE-CAPILLARY: 103 mg/dL — AB (ref 65–99)
Glucose-Capillary: 96 mg/dL (ref 65–99)
Glucose-Capillary: 68 mg/dL (ref 65–99)

## 2017-10-11 LAB — FERRITIN: FERRITIN: 235 ng/mL (ref 11–307)

## 2017-10-11 LAB — MAGNESIUM: Magnesium: 1.9 mg/dL (ref 1.7–2.4)

## 2017-10-11 LAB — PHOSPHORUS: PHOSPHORUS: 4.2 mg/dL (ref 2.5–4.6)

## 2017-10-11 MED ORDER — METOPROLOL TARTRATE 25 MG PO TABS
25.0000 mg | ORAL_TABLET | Freq: Three times a day (TID) | ORAL | Status: DC
  Administered 2017-10-11 (×3): 25 mg via ORAL
  Filled 2017-10-11 (×3): qty 1

## 2017-10-11 MED ORDER — BISACODYL 5 MG PO TBEC
10.0000 mg | DELAYED_RELEASE_TABLET | Freq: Every day | ORAL | Status: DC | PRN
  Administered 2017-10-11: 10 mg via ORAL
  Filled 2017-10-11: qty 2

## 2017-10-11 NOTE — Progress Notes (Signed)
PROGRESS NOTE    Hannah Colon  ZOX:[id number] DOB: 01-07-1956 DOA: 10/07/2017 PCP: Burnis Medin, PA-C   Brief Narrative:  62 y.o. female with medical history significant of heart failure with preserved ejection fraction, hypertension, hyperlipidemia who presents after recent hospital admission for heart failure with recurrent cough shortness of breath.  She notes that she felt okay when she went home on Tuesday.  On Wednesday she noticed worsening shortness of breath and hypoxia on her pulse ox.  She is been sleeping sitting up for the last 2 months.  Chest pain with cough breathing.  Around 630 this morning she noticed coughing fits which did not improve with a cough medicine readily.  She notes her cough is nonproductive without blood.  She denies any fevers, chills, sick contacts or recent travel.  She was recently admitted to the hospital.  She notes the chest pain is a dull ache, substernal, comes and goes and is associated with her cough.  She denies any abdominal pain, lower extremity-swelling, dysuria.  ED Course: Labs, CXR, HCAP abx, hospitalist for admission  Patient was admitted on 10/07/2017 10/08/2017 patient was taken to the ICU and was admitted by P CCM.  She was found to be new hypoxic respiratory failure requiring 5 L of oxygen.  CT done showed worsening groundglass opacity of the lungs.  She was also very hypotensive and was started on Levophed.  She had a CT scan of the chest done on 1 5 on the way back from CT scan patient developed dyspnea chest pain and hypoxemia.  Her blood pressure dropped was placed on Levophed and was placed on nonrebreather mask.  This was initially thought to be an alert allergic reaction to dye.  Echocardiogram showed ejection fraction 65% with grade 2 systolic dysfunction.  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  She became hypotensive with aggressive diuresis.  It was thought to be the reason for her  hypotension.  she was also treated with hydrocortisone IV. 10/09/2017 she was taken off levo fed.  She was able to tolerate breathing without oxygen.  Chest x-ray showed much improvement. 10/10/2017 patient seen by cardiology recommended that we have to be very careful with diuresing this patient with outflow tract obstruction with HOCM.  She was started on metoprolol 25 mg twice a day as tolerated.  ARB should be avoided as it would decrease the afterload and exacerbate HOCM.  They have plans to do a cardiac MRI once she is completely improved.  If she does not improve with medical therapy they are considering myectomy. 10/11/2017 patient awake and alert resting in bed feels her breathing has significantly improved. Assessment & Plan:   Principal Problem:   Acute respiratory failure with hypoxia (HCC) Active Problems:   Essential hypertension   Heart murmur   Hypoxia   Hypotension   (HFpEF) heart failure with preserved ejection fraction (HCC)   Acute on chronic respiratory failure (HCC)   Chest pain   1]hypoxic respiratory failure/cardiogenic pulmonary edema resolved 2] acute on chronic diastolic congestive heart failure with HOCM 3]HTN continue metoprolol. 4]anemia hb 9.7 on admit.down to 8.9.  Send iron panel guaiac stools. 5] started Dulcolax.  Plan continue cardiac medications per cardiology.  I will get PT consult patient possibly will need home health and home with PT when ready for discharge.  Cardiology planning to do cardiac MRI at some point.  I have sent labs for iron panel as well as guaiac stools follow-up.  DVT prophylaxis:  SCD Code Status: Full code Family Communication: No family available Disposition Plan: DC when okay with cardiology. Consultants:  Cardiology, P CCM Procedures: None Antimicrobials: None Subjective: Feels better complains of constipation   Objective: Vitals:   10/11/17 0500 10/11/17 0512 10/11/17 0837 10/11/17 1400  BP:  126/63 129/67 (!) 141/55    Pulse:  72 78 74  Resp:  18  18  Temp:  98 F (36.7 C)  (!) 97.5 F (36.4 C)  TempSrc:  Oral  Oral  SpO2:  95%  98%  Weight: 61.5 kg (135 lb 8 oz)     Height:        Intake/Output Summary (Last 24 hours) at 10/11/2017 1534 Last data filed at 10/11/2017 1235 Gross per 24 hour  Intake 252 ml  Output 3850 ml  Net -3598 ml   Filed Weights   10/09/17 2210 10/10/17 0500 10/11/17 0500  Weight: 62.3 kg (137 lb 5.6 oz) 68.4 kg (150 lb 12.7 oz) 61.5 kg (135 lb 8 oz)    Examination:  General exam: Appears calm and comfortable  Respiratory system: Crackles at bases auscultation. Respiratory effort normal. Cardiovascular system: S1 & S2 heard, RRR. No JVD, murmurs, rubs, gallops or clicks. No pedal edema. Gastrointestinal system: Abdomen is nondistended, soft and nontender. No organomegaly or masses felt. Normal bowel sounds heard. Central nervous system: Alert and oriented. No focal neurological deficits. Extremities: Symmetric 5 x 5 power. Skin: No rashes, lesions or ulcers Psychiatry: Judgement and insight appear normal. Mood & affect appropriate.     Data Reviewed: I have personally reviewed following labs and imaging studies  CBC: Recent Labs  Lab 10/07/17 1206 10/08/17 0055 10/09/17 0230 10/10/17 0446 10/11/17 0408  WBC 25.2* 28.3* 24.1* 11.7* 8.2  NEUTROABS  --   --  21.5* 8.1* 4.7  HGB 10.6* 9.7* 8.4* 7.9* 8.9*  HCT 32.0* 29.0* 24.8* 24.3* 26.7*  MCV 88.6 88.7 89.9 91.0 91.4  PLT 386 411* 391 371 422*   Basic Metabolic Panel: Recent Labs  Lab 10/07/17 1206 10/08/17 0055 10/09/17 0230 10/10/17 0446 10/11/17 0408  NA 132* 135 133* 137  --   K 3.7 3.6 3.7 4.0  --   CL 101 102 102 107  --   CO2 23 23 25 24   --   GLUCOSE 162* 193* 171* 99  --   BUN 15 13 18 20   --   CREATININE 0.61 0.80 0.83 0.73 0.55  CALCIUM 9.2 9.0 8.6* 8.7*  --   MG  --  1.7 2.1 1.9 1.9  PHOS  --  4.4 3.0 2.1* 4.2   GFR: Estimated Creatinine Clearance: 61.3 mL/min (by C-G formula based  on SCr of 0.55 mg/dL). Liver Function Tests: No results for input(s): AST, ALT, ALKPHOS, BILITOT, PROT, ALBUMIN in the last 168 hours. No results for input(s): LIPASE, AMYLASE in the last 168 hours. No results for input(s): AMMONIA in the last 168 hours. Coagulation Profile: No results for input(s): INR, PROTIME in the last 168 hours. Cardiac Enzymes: Recent Labs  Lab 10/07/17 1850 10/07/17 1909 10/08/17 0050 10/08/17 0632 10/08/17 1100  CKTOTAL  --   --   --   --  25*  CKMB  --   --   --   --  9.0*  TROPONINI 0.04* <0.03 0.15* 0.22*  --    BNP (last 3 results) No results for input(s): PROBNP in the last 8760 hours. HbA1C: No results for input(s): HGBA1C in the last 72 hours. CBG: Recent Labs  Lab 10/10/17 1148 10/10/17 1615 10/10/17 2149 10/11/17 0754 10/11/17 1213  GLUCAP 89 100* 113* 96 68   Lipid Profile: No results for input(s): CHOL, HDL, LDLCALC, TRIG, CHOLHDL, LDLDIRECT in the last 72 hours. Thyroid Function Tests: No results for input(s): TSH, T4TOTAL, FREET4, T3FREE, THYROIDAB in the last 72 hours. Anemia Panel: No results for input(s): VITAMINB12, FOLATE, FERRITIN, TIBC, IRON, RETICCTPCT in the last 72 hours. Sepsis Labs: Recent Labs  Lab 10/07/17 1454 10/07/17 1909 10/07/17 1953 10/07/17 2139 10/08/17 0055 10/09/17 0230  PROCALCITON  --   --  0.13  --  0.20 0.14  LATICACIDVEN 1.11 2.7*  --  1.2  --   --     Recent Results (from the past 240 hour(s))  Blood culture (routine x 2)     Status: None (Preliminary result)   Collection Time: 10/07/17  2:48 PM  Result Value Ref Range Status   Specimen Description BLOOD LEFT ANTECUBITAL  Final   Special Requests   Final    BOTTLES DRAWN AEROBIC AND ANAEROBIC Blood Culture adequate volume   Culture   Final    NO GROWTH 4 DAYS Performed at [facility], [address] [place], [place] [address]    Report Status PENDING  Incomplete  Blood culture (routine x 2)     Status: None (Preliminary result)    Collection Time: 10/07/17  3:04 PM  Result Value Ref Range Status   Specimen Description BLOOD LEFT HAND  Final   Special Requests IN PEDIATRIC BOTTLE Blood Culture adequate volume  Final   Culture   Final    NO GROWTH 4 DAYS Performed at [facility], [address] [place], [place] [address]    Report Status PENDING  Incomplete  MRSA PCR Screening     Status: None   Collection Time: 10/08/17  1:05 AM  Result Value Ref Range Status   MRSA by PCR NEGATIVE NEGATIVE Final    Comment:        The GeneXpert MRSA Assay (FDA approved for NASAL specimens only), is one component of a comprehensive MRSA colonization surveillance program. It is not intended to diagnose MRSA infection nor to guide or monitor treatment for MRSA infections.   Respiratory Panel by PCR     Status: None   Collection Time: 10/08/17 10:26 AM  Result Value Ref Range Status   Adenovirus NOT DETECTED NOT DETECTED Final   Coronavirus 229E NOT DETECTED NOT DETECTED Final   Coronavirus HKU1 NOT DETECTED NOT DETECTED Final   Coronavirus NL63 NOT DETECTED NOT DETECTED Final   Coronavirus OC43 NOT DETECTED NOT DETECTED Final   Metapneumovirus NOT DETECTED NOT DETECTED Final   Rhinovirus / Enterovirus NOT DETECTED NOT DETECTED Final   Influenza A NOT DETECTED NOT DETECTED Final   Influenza B NOT DETECTED NOT DETECTED Final   Parainfluenza Virus 1 NOT DETECTED NOT DETECTED Final   Parainfluenza Virus 2 NOT DETECTED NOT DETECTED Final   Parainfluenza Virus 3 NOT DETECTED NOT DETECTED Final   Parainfluenza Virus 4 NOT DETECTED NOT DETECTED Final   Respiratory Syncytial Virus NOT DETECTED NOT DETECTED Final   Bordetella pertussis NOT DETECTED NOT DETECTED Final   Chlamydophila pneumoniae NOT DETECTED NOT DETECTED Final   Mycoplasma pneumoniae NOT DETECTED NOT DETECTED Final    Comment: Performed at [facility], [address] [place], [place] [address]         Radiology Studies: No results  found.  Scheduled Meds: . aspirin EC  81 mg Oral Daily  . atorvastatin  20 mg Oral QPC breakfast  . azelastine  1 spray Each Nare BID  . Chlorhexidine Gluconate Cloth  6 each Topical Daily  . fluticasone  1 spray Each Nare Daily  . heparin  5,000 Units Subcutaneous Q8H  . insulin aspart  0-15 Units Subcutaneous TID WC  . mouth rinse  15 mL Mouth Rinse BID  . metoprolol tartrate  25 mg Oral TID  . sodium chloride flush  10-40 mL Intracatheter Q12H  . sodium chloride flush  3 mL Intravenous Q12H   Continuous Infusions: . sodium chloride    . sodium chloride       LOS: 4 days      Alwyn Ren, MD [facility]  If 7PM-7AM, please contact night-coverage [email or web address] Password [facility] 10/11/2017, 3:34 PM

## 2017-10-11 NOTE — Progress Notes (Signed)
Progress Note  Patient Name: Hannah KandBarbara Shed Date of Encounter: 10/11/2017  Primary Cardiologist: Chilton Siiffany [place], MD   Subjective   No chest pain; dyspnea improving  Inpatient Medications    Scheduled Meds: . aspirin EC  81 mg Oral Daily  . atorvastatin  20 mg Oral QPC breakfast  . azelastine  1 spray Each Nare BID  . Chlorhexidine Gluconate Cloth  6 each Topical Daily  . fluticasone  1 spray Each Nare Daily  . heparin  5,000 Units Subcutaneous Q8H  . insulin aspart  0-15 Units Subcutaneous TID WC  . mouth rinse  15 mL Mouth Rinse BID  . metoprolol tartrate  25 mg Oral BID  . sodium chloride flush  10-40 mL Intracatheter Q12H  . sodium chloride flush  3 mL Intravenous Q12H   Continuous Infusions: . sodium chloride    . sodium chloride     PRN Meds: sodium chloride, sodium chloride, acetaminophen, benzonatate, guaiFENesin-codeine, ipratropium-albuterol, sodium chloride flush, sodium chloride flush, sodium chloride flush   Vital Signs    Vitals:   10/10/17 1347 10/10/17 2147 10/11/17 0500 10/11/17 0512  BP: 119/63 101/64  126/63  Pulse: 71 78  72  Resp: 16 16  18   Temp: 97.7 F (36.5 C) 98.5 F (36.9 C)  98 F (36.7 C)  TempSrc: Oral Oral  Oral  SpO2: 98% 93%  95%  Weight:   135 lb 8 oz (61.5 kg)   Height:        Intake/Output Summary (Last 24 hours) at 10/11/2017 0801 Last data filed at 10/11/2017 0527 Gross per 24 hour  Intake 30 ml  Output 4400 ml  Net -4370 ml   Filed Weights   10/09/17 2210 10/10/17 0500 10/11/17 0500  Weight: 137 lb 5.6 oz (62.3 kg) 150 lb 12.7 oz (68.4 kg) 135 lb 8 oz (61.5 kg)    Telemetry    Sinus- Personally Reviewed  Physical Exam   GEN: WD/WN No acute distress.   Neck: supple Cardiac: RRR, 2/6 systolic murmur that increases with valsalva Respiratory: Clear to auscultation bilaterally; no wheeze. GI: Soft, nontender, non-distended, no masses  MS: No edema Neuro:  grossly intact   Labs    Chemistry Recent Labs    Lab 10/08/17 0055 10/09/17 0230 10/10/17 0446 10/11/17 0408  NA 135 133* 137  --   K 3.6 3.7 4.0  --   CL 102 102 107  --   CO2 23 25 24   --   GLUCOSE 193* 171* 99  --   BUN 13 18 20   --   CREATININE 0.80 0.83 0.73 0.55  CALCIUM 9.0 8.6* 8.7*  --   GFRNONAA >60 >60 >60 >60  GFRAA >60 >60 >60 >60  ANIONGAP 10 6 6   --      Hematology Recent Labs  Lab 10/09/17 0230 10/10/17 0446 10/11/17 0408  WBC 24.1* 11.7* 8.2  RBC 2.76* 2.67* 2.92*  HGB 8.4* 7.9* 8.9*  HCT 24.8* 24.3* 26.7*  MCV 89.9 91.0 91.4  MCH 30.4 29.6 30.5  MCHC 33.9 32.5 33.3  RDW 14.9 15.2 15.2  PLT 391 371 422*    Cardiac Enzymes Recent Labs  Lab 10/07/17 1850 10/07/17 1909 10/08/17 0050 10/08/17 0632  TROPONINI 0.04* <0.03 0.15* 0.22*    Recent Labs  Lab 10/07/17 1220 10/07/17 1751  TROPIPOC 0.00 0.00     BNP Recent Labs  Lab 10/07/17 1206  BNP 98.8     Radiology    Dg Chest Port 1  View  Result Date: 10/09/2017 CLINICAL DATA:  ACUTE RESPIRATORY FAILURE. EXAM: PORTABLE CHEST 1 VIEW COMPARISON:  10/07/2017. FINDINGS: The heart size and mediastinal contours are within normal limits. Aeration is improved with some clearing of BILATERAL pulmonary opacities. The visualized skeletal structures are unremarkable. Central venous catheter lies with its tip in the proximal RIGHT atrium. IMPRESSION: Improved aeration.  No change central venous catheter. Electronically Signed   By: Elsie Stain M.D.   On: 10/09/2017 14:36    Patient Profile     62 y.o. female admitted with acute on chronic diastolic congestive heart failure.  Echocardiogram shows hyperdynamic left ventricular function, severe proximal septal thickening, grade 2 diastolic dysfunction, left ventricular outflow tract gradient, severe SAM and probable moderate mitral regurgitation.  Assessment & Plan    1 acute on chronic diastolic congestive heart failure-dyspnea improved this AM. Previous echo shows severe proximal septal  thickening with severe systolic anterior motion of the mitral valve and elevated LVOT gradient.  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 She became hypotensive previously following vigorous diuresis likely due to reduced preload with worsening of outflow gradient.  I will try and increase metoprolol to 25 mg tid.  It appears metoprolol dose will be limited by blood pressure.  She may also need low-dose diuretic as tolerated but would like to treat with as low a dose as possible to avoid reducing preload.  ARB DCed to avoid decreased afterload which will exacerbate HOCM physiology.  After she improves would likely plan cardiac MRI.  If she does not improve with medical therapy would need to consider myectomy.  We discussed the importance of low-sodium diet.  Also note her anemia will worsen her outflow tract gradient. Would not check daily CBCs and limit blood draws.  2 hypertension-we will treat blood pressure with beta blockade.  Avoid ARB as outlined above.  3 anemia-further evaluation per primary care.  Avoid frequent blood draws.  For questions or updates, please contact [facility] Please consult [email or web address] for contact info under Cardiology/STEMI.      Signed, Olga Millers, MD  10/11/2017, 8:01 AM

## 2017-10-11 NOTE — Care Management Note (Signed)
Case Management Note  Patient Details  Name: Hannah KandBarbara Colon MRN: [id number] Date of Birth: 1956/07/26  Subjective/Objective:  Pt admitted with Acute respiratory failure with hypoxia                  Action/Plan: Will continue to follow for discharge needs   Expected Discharge Date:                  Expected Discharge Plan:  Home w Home Health Services  In-House Referral:     Discharge planning Services  CM Consult  Post Acute Care Choice:    Choice offered to:     DME Arranged:    DME Agency:     HH Arranged:    HH Agency:     Status of Service:  In process, will continue to follow  If discussed at Long Length of Stay Meetings, dates discussed:    Additional CommentsGeni Bers:  [name], RN 10/11/2017, 3:56 PM

## 2017-10-12 DIAGNOSIS — I5033 Acute on chronic diastolic (congestive) heart failure: Secondary | ICD-10-CM

## 2017-10-12 DIAGNOSIS — I10 Essential (primary) hypertension: Secondary | ICD-10-CM

## 2017-10-12 DIAGNOSIS — D649 Anemia, unspecified: Secondary | ICD-10-CM

## 2017-10-12 LAB — HYPERSENSITIVITY PNEUMONITIS
A. Pullulans Abs: POSITIVE — AB
A.Fumigatus #1 Abs: NEGATIVE
Micropolyspora faeni, IgG: NEGATIVE
PIGEON SERUM ABS: NEGATIVE
Thermoact. Saccharii: NEGATIVE
Thermoactinomyces vulgaris, IgG: NEGATIVE

## 2017-10-12 LAB — CULTURE, BLOOD (ROUTINE X 2)
Culture: NO GROWTH
Culture: NO GROWTH
Special Requests: ADEQUATE
Special Requests: ADEQUATE

## 2017-10-12 LAB — GLUCOSE, CAPILLARY
GLUCOSE-CAPILLARY: 87 mg/dL (ref 65–99)
GLUCOSE-CAPILLARY: 82 mg/dL (ref 65–99)
GLUCOSE-CAPILLARY: 117 mg/dL — AB (ref 65–99)
Glucose-Capillary: 87 mg/dL (ref 65–99)

## 2017-10-12 MED ORDER — METOPROLOL TARTRATE 50 MG PO TABS
50.0000 mg | ORAL_TABLET | Freq: Two times a day (BID) | ORAL | Status: DC
  Administered 2017-10-12 (×2): 50 mg via ORAL
  Filled 2017-10-12 (×2): qty 1

## 2017-10-12 NOTE — Progress Notes (Signed)
Progress Note  Patient Name: Hannah Colon Date of Encounter: 10/12/2017  Primary Cardiologist: Chilton Si, MD   Subjective   Mild fatigue; no dyspnea or CP  Inpatient Medications    Scheduled Meds: . aspirin EC  81 mg Oral Daily  . atorvastatin  20 mg Oral QPC breakfast  . azelastine  1 spray Each Nare BID  . Chlorhexidine Gluconate Cloth  6 each Topical Daily  . fluticasone  1 spray Each Nare Daily  . heparin  5,000 Units Subcutaneous Q8H  . insulin aspart  0-15 Units Subcutaneous TID WC  . mouth rinse  15 mL Mouth Rinse BID  . metoprolol tartrate  25 mg Oral TID  . sodium chloride flush  10-40 mL Intracatheter Q12H  . sodium chloride flush  3 mL Intravenous Q12H   Continuous Infusions: . sodium chloride    . sodium chloride     PRN Meds: sodium chloride, sodium chloride, acetaminophen, benzonatate, bisacodyl, guaiFENesin-codeine, ipratropium-albuterol, sodium chloride flush, sodium chloride flush, sodium chloride flush   Vital Signs    Vitals:   10/11/17 0837 10/11/17 1400 10/11/17 2218 10/12/17 0431  BP: 129/67 (!) 141/55 112/62 (!) 127/59  Pulse: 78 74 70 74  Resp:  18 18 16   Temp:  (!) 97.5 F (36.4 C) 97.8 F (36.6 C) 98.7 F (37.1 C)  TempSrc:  Oral Oral Oral  SpO2:  98% 94% 93%  Weight:    137 lb 9.1 oz (62.4 kg)  Height:        Intake/Output Summary (Last 24 hours) at 10/12/2017 0905 Last data filed at 10/12/2017 0439 Gross per 24 hour  Intake 740 ml  Output 2400 ml  Net -1660 ml   Filed Weights   10/10/17 0500 10/11/17 0500 10/12/17 0431  Weight: 150 lb 12.7 oz (68.4 kg) 135 lb 8 oz (61.5 kg) 137 lb 9.1 oz (62.4 kg)    Telemetry    Sinus- Personally Reviewed  Physical Exam   GEN: WD/WN NAD Neck: supple, no JVD Cardiac: RRR, 3/6 systolic murmur Respiratory: CTA GI: Soft,NT/ND MS: No edema Neuro:  no focal findings   Labs    Chemistry Recent Labs  Lab 10/08/17 0055 10/09/17 0230 10/10/17 0446 10/11/17 0408  NA 135  133* 137  --   K 3.6 3.7 4.0  --   CL 102 102 107  --   CO2 23 25 24   --   GLUCOSE 193* 171* 99  --   BUN 13 18 20   --   CREATININE 0.80 0.83 0.73 0.55  CALCIUM 9.0 8.6* 8.7*  --   GFRNONAA >60 >60 >60 >60  GFRAA >60 >60 >60 >60  ANIONGAP 10 6 6   --      Hematology Recent Labs  Lab 10/09/17 0230 10/10/17 0446 10/11/17 0408  WBC 24.1* 11.7* 8.2  RBC 2.76* 2.67* 2.92*  HGB 8.4* 7.9* 8.9*  HCT 24.8* 24.3* 26.7*  MCV 89.9 91.0 91.4  MCH 30.4 29.6 30.5  MCHC 33.9 32.5 33.3  RDW 14.9 15.2 15.2  PLT 391 371 422*    Cardiac Enzymes Recent Labs  Lab 10/07/17 1850 10/07/17 1909 10/08/17 0050 10/08/17 0632  TROPONINI 0.04* <0.03 0.15* 0.22*    Recent Labs  Lab 10/07/17 1220 10/07/17 1751  TROPIPOC 0.00 0.00     BNP Recent Labs  Lab 10/07/17 1206  BNP 98.8     Patient Profile     62 y.o. female admitted with acute on chronic diastolic congestive heart failure.  Echocardiogram shows hyperdynamic left ventricular function, severe proximal septal thickening, grade 2 diastolic dysfunction, left ventricular outflow tract gradient, severe SAM and probable moderate mitral regurgitation.  Assessment & Plan    1 acute on chronic diastolic congestive heart failure-dyspnea resolved. Echo shows severe proximal septal thickening with severe systolic anterior motion of the mitral valve and elevated LVOT gradient.  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 She became hypotensive previously following vigorous diuresis likely due to reduced preload with worsening of outflow gradient.  Increase metoprolol to 50 mg BID. She may also need low-dose diuretic as tolerated but would like to treat with as low a dose as possible to avoid reducing preload.  ARB DCed to avoid decreased afterload which will exacerbate HOCM physiology.  Will arrange cardiac MRI as outpt.  If she does not improve with medical therapy would need to consider myectomy.    2 hypertension-we will treat  blood pressure with beta blockade (increase to 50 mg BID).  Avoid ARB as outlined above.  3 anemia-WU per primary care.  DC IJ. Possible DC in AM if stable.  For questions or updates, please contact [facility] Please consult [email or web address] for contact info under Cardiology/STEMI.      Signed, Olga MillersBrian Zylan Almquist, MD  10/12/2017, 9:05 AM

## 2017-10-12 NOTE — Evaluation (Signed)
Physical Therapy Evaluation Patient Details Name: Jahlisa Rossitto MRN: [id number] DOB: 24-Oct-1955 Today's Date: 10/12/2017   History of Present Illness   62 y.o. female with medical history significant of heart failure with preserved ejection fraction, hypertension, hyperlipidemia who presents after recent hospital admission for heart failure with recurrent cough shortness of breath. Dx of respiratory failure.   Clinical Impression  Pt is independent with mobility.  She ambulated 800' without an assistive device with no loss of balance, SaO2 88-93% on room air, HR 80s, 0/4 dyspnea. From PT standpoint, she is ready to DC home. No further PT indicated, will sign off.     Follow Up Recommendations No PT follow up    Equipment Recommendations  None recommended by PT    Recommendations for Other Services       Precautions / Restrictions Precautions Precautions: None Restrictions Weight Bearing Restrictions: No      Mobility  Bed Mobility Overal bed mobility: Independent                Transfers Overall transfer level: Independent                  Ambulation/Gait Ambulation/Gait assistance: Independent Ambulation Distance (Feet): 800 Feet Assistive device: None Gait Pattern/deviations: WFL(Within Functional Limits)   Gait velocity interpretation: at or above normal speed for age/gender General Gait Details: steady without assistive device, SaO2 88-93% on room air walking, HR 80s, no dyspnea  Stairs            Wheelchair Mobility    Modified Rankin (Stroke Patients Only)       Balance Overall balance assessment: Independent                                           Pertinent Vitals/Pain Pain Assessment: No/denies pain    Home Living Family/patient expects to be discharged to:: Private residence Living Arrangements: Spouse/significant other;Children Available Help at Discharge: Family;Available PRN/intermittently Type of  Home: Apartment Home Access: Stairs to enter Entrance Stairs-Rails: Right Entrance Stairs-Number of Steps: flight Home Layout: One level Home Equipment: None      Prior Function Level of Independence: Independent         Comments: works in accounts payable     Higher education careers adviser        Extremity/Trunk Assessment   Upper Extremity Assessment Upper Extremity Assessment: Overall WFL for tasks assessed    Lower Extremity Assessment Lower Extremity Assessment: Overall WFL for tasks assessed    Cervical / Trunk Assessment Cervical / Trunk Assessment: Normal  Communication   Communication: No difficulties  Cognition Arousal/Alertness: Awake/alert Behavior During Therapy: WFL for tasks assessed/performed Overall Cognitive Status: Within Functional Limits for tasks assessed                                        General Comments      Exercises     Assessment/Plan    PT Assessment Patent does not need any further PT services  PT Problem List         PT Treatment Interventions      PT Goals (Current goals can be found in the Care Plan section)  Acute Rehab PT Goals Patient Stated Goal: return to work PT Goal Formulation: All assessment and education complete, DC therapy  Frequency     Barriers to discharge        Co-evaluation               AM-PAC PT "6 Clicks" Daily Activity  Outcome Measure Difficulty turning over in bed (including adjusting bedclothes, sheets and blankets)?: None Difficulty moving from lying on back to sitting on the side of the bed? : None Difficulty sitting down on and standing up from a chair with arms (e.g., wheelchair, bedside commode, etc,.)?: None Help needed moving to and from a bed to chair (including a wheelchair)?: None Help needed walking in hospital room?: None Help needed climbing 3-5 steps with a railing? : None 6 Click Score: 24    End of Session   Activity Tolerance: Patient tolerated  treatment well Patient left: in chair;with call bell/phone within reach Nurse Communication: Mobility status      Time: [id number] PT Time Calculation (min) (ACUTE ONLY): 17 min   Charges:   PT Evaluation $PT Eval Low Complexity: 1 Low     PT G CodesTamala Ser:         Jevan Gaunt Kistler 10/12/2017, 1:18 PM [phone]

## 2017-10-12 NOTE — Progress Notes (Signed)
PROGRESS NOTE    Hannah Colon  YPP:[id number] DOB: [birth year] DOA: 10/07/2017 PCP: Elisabeth Cara, PA-C    Brief Narrative:  62 y.o.femalewith medical history significant ofheart failure with preserved ejection fraction, hypertension, hyperlipidemia who presents after recent hospital admission for heart failure with recurrent cough shortness of breath.  She notes that she felt okay when she went home on Tuesday. On Wednesday she noticed worsening shortness of breath and hypoxia on her pulse ox. She is been sleeping sitting up for the last 2 months. Chest pain with cough breathing. Around 630 this morning she noticed coughing fitswhich did not improve with a cough medicine readily. She notes her cough is nonproductive without blood. She denies any fevers, chills, sick contacts or recent travel. She was recently admitted to the hospital. She notes the chest pain is a dull ache,substernal, comes and goes and is associated with her cough. She denies any abdominal pain, lower extremity-swelling, dysuria.  Patient was admitted on 10/07/2017 10/08/2017 patient was taken to the ICU and was admitted by P CCM.  She was found to be new hypoxic respiratory failure requiring 5 L of oxygen.  CT done showed worsening groundglass opacity of the lungs.  She was also very hypotensive and was started on Levophed.  She had a CT scan of the chest done on 1 5 on the way back from CT scan patient developed dyspnea chest pain and hypoxemia.  Her blood pressure dropped was placed on Levophed and was placed on nonrebreather mask.  This was initially thought to be an alert allergic reaction to dye.  Echocardiogram showed ejection fraction 65% with grade 2 systolic dysfunction.  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  She became hypotensive with aggressive diuresis.  It was thought to be the reason for her hypotension.  she was also treated with hydrocortisone  IV. 10/09/2017 she was taken off levo fed.  She was able to tolerate breathing without oxygen.  Chest x-ray showed much improvement. 10/10/2017 patient seen by cardiology recommended that we have to be very careful with diuresing this patient with outflow tract obstruction with HOCM.  She was started on metoprolol 25 mg twice a day as tolerated.  ARB should be avoided as it would decrease the afterload and exacerbate HOCM.  They have plans to do a cardiac MRI once she is completely improved.  If she does not improve with medical therapy they are considering myectomy. 10/11/2017 patient awake and alert resting in bed feels her breathing has significantly improved     Assessment & Plan:   Principal Problem:   Acute respiratory failure with hypoxia (HCC) Active Problems:   Essential hypertension   Heart murmur   Hypoxia   Hypotension   (HFpEF) heart failure with preserved ejection fraction (HCC)   Acute on chronic respiratory failure (HCC)   Chest pain   Anemia  #1 acute hypoxic respiratory failure/cardiogenic pulmonary edema/acute on chronic diastolic heart failure/worsening interstitial lung disease Clinical improvement.  Patient had presented with recurrent acute hypoxic respiratory failure felt likely to be multifactorial mainly from cardiogenic pulmonary edema and worsening interstitial lung disease.  Patient was seen in consultation by pulmonary and cardiology and it was felt that patient's cardiac etiology was the main cause of patient's symptoms.  She is noted on 2D echo to have a hyperdynamic left ventricular function with severe proximal septal thickening, grade 2 diastolic dysfunction, left ventricular outflow tract gradient, severe S.A.M. and probable moderate mitral regurgitation.  Patient has been  seen in consultation by cardiology and patient's beta-blocker dose has been adjusted to 50 mg twice daily.  ARB has been discontinued in order to avoid a decrease in afterload which per  cardiology will exacerbate HOCM physiology.  Respiratory viral panel obtained on admission was negative.  Urine strep pneumococcus antigen was negative.  CCP antibodies negative.  Sed rate elevated at 60.  GBM antibody was negative.  Rheumatoid factor was 12.5.  Patient will need to follow-up with pulmonary in the outpatient setting and may need a BAL and possible transbronchial biopsy at that time in the outpatient setting.  Per cardiology.  2.  Hypertension Continue metoprolol.  Dose has been increased to 50 mg twice daily per cardiology.  Avoid ARB as this will exacerbate HOCM physiology by decreasing afterload.  3.  Anemia Anemia panel consistent with anemia of chronic disease.  H&H has been stable.  Minimize lab draws and will draw labs in pediatric tubes.    DVT prophylaxis: Heparin Code Status: Full Family Communication: Updated patient.  No family at bedside. Disposition Plan: Per cardiology.  Likely home once medically stable and cleared by cardiology.   Consultants:   Cardiology: Dr. Caryl Comes 10/09/2017  PCCM: Dr. Chase Caller 10/08/2016  PCCM: Dr. Gilford Raid 10/07/2016  Procedures:   CT angiogram chest 10/07/2017  Chest x-ray 10/07/2017, 10/09/2017  2D echo 10/08/2017  Antimicrobials:   IV azithromycin 10/07/2017>> 10/10/2017  IV cefepime 10/07/2017>>>>> 10/10/2017   Subjective: Sitting up in chair.  Patient states her breathing has improved significantly and this is the best she is felt in a while.  Denies any chest pain.  Objective: Vitals:   10/11/17 2218 10/12/17 0431 10/12/17 1434 10/12/17 2128  BP: 112/62 (!) 127/59 123/61 106/63  Pulse: 70 74 71 71  Resp: _0 Temp: 97.8 F (36.6 C) 98.7 F (37.1 C) 98 F (36.7 C) 98.3 F (36.8 C)  TempSrc: Oral Oral Oral Oral  SpO2: 94% 93% 96% 95%  Weight:  62.4 kg (137 lb 9.1 oz)    Height:        Intake/Output Summary (Last 24 hours) at 10/12/2017 2131 Last data filed at 10/12/2017 2104 Gross per 24 hour  Intake 600 ml   Output 3450 ml  Net -2850 ml   Filed Weights   10/10/17 0500 10/11/17 0500 10/12/17 0431  Weight: 68.4 kg (150 lb 12.7 oz) 61.5 kg (135 lb 8 oz) 62.4 kg (137 lb 9.1 oz)    Examination:  General exam: Appears calm and comfortable  Respiratory system: Some scattered diffuse fine coarse breath sounds noted.  No wheezing.  No use of accessory muscles of respiration. Respiratory effort normal. Cardiovascular system: S1 & S2 heard, RRR. No JVD, murmurs, rubs, gallops or clicks. No pedal edema. Gastrointestinal system: Abdomen is nondistended, soft and nontender. No organomegaly or masses felt. Normal bowel sounds heard. Central nervous system: Alert and oriented. No focal neurological deficits. Extremities: Symmetric 5 x 5 power. Skin: No rashes, lesions or ulcers Psychiatry: Judgement and insight appear normal. Mood & affect appropriate.     Data Reviewed: I have personally reviewed following labs and imaging studies  CBC: Recent Labs  Lab 10/07/17 1206 10/08/17 0055 10/09/17 0230 10/10/17 0446 10/11/17 0408  WBC 25.2* 28.3* 24.1* 11.7* 8.2  NEUTROABS  --   --  21.5* 8.1* 4.7  HGB 10.6* 9.7* 8.4* 7.9* 8.9*  HCT 32.0* 29.0* 24.8* 24.3* 26.7*  MCV 88.6 88.7 89.9 91.0 91.4  PLT 386 411* 391 371 422*  Basic Metabolic Panel: Recent Labs  Lab 10/07/17 1206 10/08/17 0055 10/09/17 0230 10/10/17 0446 10/11/17 0408  NA 132* 135 133* 137  --   K 3.7 3.6 3.7 4.0  --   CL 101 102 102 107  --   CO2 _0 --   GLUCOSE 162* 193* 171* 99  --   BUN _1 --   CREATININE 0.61 0.80 0.83 0.73 0.55  CALCIUM 9.2 9.0 8.6* 8.7*  --   MG  --  1.7 2.1 1.9 1.9  PHOS  --  4.4 3.0 2.1* 4.2   GFR: Estimated Creatinine Clearance: 61.7 mL/min (by C-G formula based on SCr of 0.55 mg/dL). Liver Function Tests: No results for input(s): AST, ALT, ALKPHOS, BILITOT, PROT, ALBUMIN in the last 168 hours. No results for input(s): LIPASE, AMYLASE in the last 168 hours. No results for  input(s): AMMONIA in the last 168 hours. Coagulation Profile: No results for input(s): INR, PROTIME in the last 168 hours. Cardiac Enzymes: Recent Labs  Lab 10/07/17 1850 10/07/17 1909 10/08/17 0050 10/08/17 0632 10/08/17 1100  CKTOTAL  --   --   --   --  25*  CKMB  --   --   --   --  9.0*  TROPONINI 0.04* <0.03 0.15* 0.22*  --    BNP (last 3 results) No results for input(s): PROBNP in the last 8760 hours. HbA1C: No results for input(s): HGBA1C in the last 72 hours. CBG: Recent Labs  Lab 10/11/17 1637 10/11/17 2213 10/12/17 0754 10/12/17 1158 10/12/17 1700  GLUCAP 116* 103* 87 82 87   Lipid Profile: No results for input(s): CHOL, HDL, LDLCALC, TRIG, CHOLHDL, LDLDIRECT in the last 72 hours. Thyroid Function Tests: No results for input(s): TSH, T4TOTAL, FREET4, T3FREE, THYROIDAB in the last 72 hours. Anemia Panel: Recent Labs    10/11/17 1953  FERRITIN 235  TIBC 344  IRON 44   Sepsis Labs: Recent Labs  Lab 10/07/17 1454 10/07/17 1909 10/07/17 1953 10/07/17 2139 10/08/17 0055 10/09/17 0230  PROCALCITON  --   --  0.13  --  0.20 0.14  LATICACIDVEN 1.11 2.7*  --  1.2  --   --     Recent Results (from the past 240 hour(s))  Blood culture (routine x 2)     Status: None   Collection Time: 10/07/17  2:48 PM  Result Value Ref Range Status   Specimen Description BLOOD LEFT ANTECUBITAL  Final   Special Requests   Final    BOTTLES DRAWN AEROBIC AND ANAEROBIC Blood Culture adequate volume   Culture   Final    NO GROWTH 5 DAYS Performed at [facility], [place] [address] [place], [place] [address]    Report Status 10/12/2017 FINAL  Final  Blood culture (routine x 2)     Status: None   Collection Time: 10/07/17  3:04 PM  Result Value Ref Range Status   Specimen Description BLOOD LEFT HAND  Final   Special Requests IN PEDIATRIC BOTTLE Blood Culture adequate volume  Final   Culture   Final    NO GROWTH 5 DAYS Performed at [facility], [place] [address], [place], [place] [address]    Report Status 10/12/2017 FINAL  Final  MRSA PCR Screening     Status: None   Collection Time: 10/08/17  1:05 AM  Result Value Ref Range Status   MRSA by PCR NEGATIVE NEGATIVE Final    Comment:  The GeneXpert MRSA Assay (FDA approved for NASAL specimens only), is one component of a comprehensive MRSA colonization surveillance program. It is not intended to diagnose MRSA infection nor to guide or monitor treatment for MRSA infections.   Respiratory Panel by PCR     Status: None   Collection Time: 10/08/17 10:26 AM  Result Value Ref Range Status   Adenovirus NOT DETECTED NOT DETECTED Final   Coronavirus 229E NOT DETECTED NOT DETECTED Final   Coronavirus HKU1 NOT DETECTED NOT DETECTED Final   Coronavirus NL63 NOT DETECTED NOT DETECTED Final   Coronavirus OC43 NOT DETECTED NOT DETECTED Final   Metapneumovirus NOT DETECTED NOT DETECTED Final   Rhinovirus / Enterovirus NOT DETECTED NOT DETECTED Final   Influenza A NOT DETECTED NOT DETECTED Final   Influenza B NOT DETECTED NOT DETECTED Final   Parainfluenza Virus 1 NOT DETECTED NOT DETECTED Final   Parainfluenza Virus 2 NOT DETECTED NOT DETECTED Final   Parainfluenza Virus 3 NOT DETECTED NOT DETECTED Final   Parainfluenza Virus 4 NOT DETECTED NOT DETECTED Final   Respiratory Syncytial Virus NOT DETECTED NOT DETECTED Final   Bordetella pertussis NOT DETECTED NOT DETECTED Final   Chlamydophila pneumoniae NOT DETECTED NOT DETECTED Final   Mycoplasma pneumoniae NOT DETECTED NOT DETECTED Final    Comment: Performed at [facility], [place] [address] [place], [place] [address]         Radiology Studies: No results found.      Scheduled Meds: . aspirin EC  81 mg Oral Daily  . atorvastatin  20 mg Oral QPC breakfast  . azelastine  1 spray Each Nare BID  . Chlorhexidine Gluconate Cloth  6 each Topical Daily  . fluticasone  1 spray Each Nare Daily  . heparin  5,000 Units Subcutaneous Q8H   . insulin aspart  0-15 Units Subcutaneous TID WC  . mouth rinse  15 mL Mouth Rinse BID  . metoprolol tartrate  50 mg Oral BID  . sodium chloride flush  10-40 mL Intracatheter Q12H  . sodium chloride flush  3 mL Intravenous Q12H   Continuous Infusions: . sodium chloride    . sodium chloride       LOS: 5 days    Time spent: 35 mins    Irine Seal, MD [facility] Pager [phone]  If 7PM-7AM, please contact night-coverage [email or web address] Password [email or web address] 10/12/2017, 9:31 PM

## 2017-10-13 DIAGNOSIS — J849 Interstitial pulmonary disease, unspecified: Secondary | ICD-10-CM

## 2017-10-13 LAB — CBC WITH DIFFERENTIAL/PLATELET
BASOS ABS: 0.1 10*3/uL (ref 0.0–0.1)
Basophils Relative: 1 %
EOS PCT: 7 %
Eosinophils Absolute: 0.5 10*3/uL (ref 0.0–0.7)
HEMATOCRIT: 30.8 % — AB (ref 36.0–46.0)
Hemoglobin: 9.9 g/dL — ABNORMAL LOW (ref 12.0–15.0)
LYMPHS ABS: 1.9 10*3/uL (ref 0.7–4.0)
LYMPHS PCT: 25 %
MCH: 29.6 pg (ref 26.0–34.0)
MCHC: 32.1 g/dL (ref 30.0–36.0)
MCV: 91.9 fL (ref 78.0–100.0)
MONOS PCT: 10 %
Monocytes Absolute: 0.8 10*3/uL (ref 0.1–1.0)
NEUTROS ABS: 4.2 10*3/uL (ref 1.7–7.7)
Neutrophils Relative %: 57 %
PLATELETS: 480 10*3/uL — AB (ref 150–400)
RBC: 3.35 MIL/uL — ABNORMAL LOW (ref 3.87–5.11)
RDW: 15.2 % (ref 11.5–15.5)
WBC: 7.4 10*3/uL (ref 4.0–10.5)

## 2017-10-13 LAB — BASIC METABOLIC PANEL
Anion gap: 6 (ref 5–15)
BUN: 14 mg/dL (ref 6–20)
CHLORIDE: 105 mmol/L (ref 101–111)
CO2: 27 mmol/L (ref 22–32)
CREATININE: 0.68 mg/dL (ref 0.44–1.00)
Calcium: 9.5 mg/dL (ref 8.9–10.3)
GFR calc Af Amer: >60 mL/min (ref >60)
GLUCOSE: 104 mg/dL — AB (ref 65–99)
Potassium: 4.1 mmol/L (ref 3.5–5.1)
SODIUM: 138 mmol/L (ref 135–145)

## 2017-10-13 LAB — GLUCOSE, CAPILLARY
GLUCOSE-CAPILLARY: 82 mg/dL (ref 65–99)
Glucose-Capillary: 90 mg/dL (ref 65–99)

## 2017-10-13 MED ORDER — METOPROLOL SUCCINATE ER 100 MG PO TB24
100.0000 mg | ORAL_TABLET | Freq: Every day | ORAL | Status: DC
  Administered 2017-10-13: 100 mg via ORAL
  Filled 2017-10-13: qty 1

## 2017-10-13 MED ORDER — IPRATROPIUM-ALBUTEROL 0.5-2.5 (3) MG/3ML IN SOLN: 3.0000 mL | Freq: Four times a day (QID) | RESPIRATORY_TRACT | 0 refills | Status: DC | PRN

## 2017-10-13 MED ORDER — METOPROLOL SUCCINATE ER 100 MG PO TB24: 100.0000 mg | ORAL_TABLET | Freq: Every day | ORAL | 1 refills | Status: DC

## 2017-10-13 MED ORDER — BENZONATATE 100 MG PO CAPS: 100.0000 mg | ORAL_CAPSULE | Freq: Three times a day (TID) | ORAL | 0 refills | Status: DC | PRN

## 2017-10-13 MED ORDER — FUROSEMIDE 20 MG PO TABS: 20.0000 mg | ORAL_TABLET | Freq: Every day | ORAL | 0 refills | Status: DC | PRN

## 2017-10-13 NOTE — Progress Notes (Signed)
Reviewed discharge information with patient and caregiver. Answered all questions. Patient and caregiver able to teach back medications and reasons to contact MD or 911. Patient verbalizes importance of PCP follow up appointment. 

## 2017-10-13 NOTE — Care Management Note (Signed)
Case Management Note  Patient Details  Name: Hannah Colon MRN: [id number] Date of Birth: 1956-05-06  Subjective/Objective:     Admitted in with [facility]               Action/Plan: Home with [facility] program for CHF   Expected Discharge Date:  10/13/17               Expected Discharge Plan:  Home w Home Health Services  In-House Referral:     Discharge planning Services  CM Consult  Post Acute Care Choice:    Choice offered to:     DME Arranged:    DME Agency:     HH Arranged:  RN HH Agency:  [facility]  Status of Service:  In process, will continue to follow  If discussed at Long Length of Stay Meetings, dates discussed:    Additional CommentsGeni Bers:  Johnna Bollier, RN 10/13/2017, 2:56 PM

## 2017-10-13 NOTE — Progress Notes (Addendum)
Progress Note  Patient Name: Hannah Colon Date of Encounter: 10/13/2017  Primary Cardiologist: Kirk Ruths MD  Subjective   Pt denies CP or dyspnea  Inpatient Medications    Scheduled Meds: . aspirin EC  81 mg Oral Daily  . atorvastatin  20 mg Oral QPC breakfast  . azelastine  1 spray Each Nare BID  . Chlorhexidine Gluconate Cloth  6 each Topical Daily  . fluticasone  1 spray Each Nare Daily  . heparin  5,000 Units Subcutaneous Q8H  . insulin aspart  0-15 Units Subcutaneous TID WC  . mouth rinse  15 mL Mouth Rinse BID  . metoprolol tartrate  50 mg Oral BID  . sodium chloride flush  10-40 mL Intracatheter Q12H  . sodium chloride flush  3 mL Intravenous Q12H   Continuous Infusions: . sodium chloride    . sodium chloride     PRN Meds: sodium chloride, sodium chloride, acetaminophen, benzonatate, bisacodyl, guaiFENesin-codeine, ipratropium-albuterol, sodium chloride flush, sodium chloride flush, sodium chloride flush   Vital Signs    Vitals:   10/12/17 0431 10/12/17 1434 10/12/17 2128 10/13/17 0358  BP: (!) 127/59 123/61 106/63 111/64  Pulse: 74 71 71 66  Resp: _0 Temp: 98.7 F (37.1 C) 98 F (36.7 C) 98.3 F (36.8 C) 97.7 F (36.5 C)  TempSrc: Oral Oral Oral Oral  SpO2: 93% 96% 95% 97%  Weight: 137 lb 9.1 oz (62.4 kg)   130 lb 8.2 oz (59.2 kg)  Height:        Intake/Output Summary (Last 24 hours) at 10/13/2017 0946 Last data filed at 10/13/2017 0500 Gross per 24 hour  Intake 480 ml  Output 2850 ml  Net -2370 ml   Filed Weights   10/11/17 0500 10/12/17 0431 10/13/17 0358  Weight: 135 lb 8 oz (61.5 kg) 137 lb 9.1 oz (62.4 kg) 130 lb 8.2 oz (59.2 kg)    Telemetry    Sinus- Personally Reviewed  Physical Exam   GEN: NAD; not dyspneic Neck: No JVD Cardiac: RRR, 3/6 systolic murmur Respiratory: CTA; no wheeze GI: Soft,NT/ND, no masses MS: No edema Neuro:  grossly intact   Labs    Chemistry Recent Labs  Lab 10/09/17 0230  10/10/17 0446 10/11/17 0408 10/13/17 0421  NA 133* 137  --  138  K 3.7 4.0  --  4.1  CL 102 107  --  105  CO2 25 24  --  27  GLUCOSE 171* 99  --  104*  BUN 18 20  --  14  CREATININE 0.83 0.73 0.55 0.68  CALCIUM 8.6* 8.7*  --  9.5  GFRNONAA >60 >60 >60 >60  GFRAA >60 >60 >60 >60  ANIONGAP 6 6  --  6     Hematology Recent Labs  Lab 10/10/17 0446 10/11/17 0408 10/13/17 0421  WBC 11.7* 8.2 7.4  RBC 2.67* 2.92* 3.35*  HGB 7.9* 8.9* 9.9*  HCT 24.3* 26.7* 30.8*  MCV 91.0 91.4 91.9  MCH 29.6 30.5 29.6  MCHC 32.5 33.3 32.1  RDW 15.2 15.2 15.2  PLT 371 422* 480*    Cardiac Enzymes Recent Labs  Lab 10/07/17 1850 10/07/17 1909 10/08/17 0050 10/08/17 0632  TROPONINI 0.04* <0.03 0.15* 0.22*    Recent Labs  Lab 10/07/17 1220 10/07/17 1751  TROPIPOC 0.00 0.00     BNP Recent Labs  Lab 10/07/17 1206  BNP 98.8     Patient Profile     62 y.o. female admitted with acute  on chronic diastolic congestive heart failure.  Echocardiogram shows hyperdynamic left ventricular function, severe proximal septal thickening, grade 2 diastolic dysfunction, left ventricular outflow tract gradient, severe SAM and probable moderate mitral regurgitation.  Assessment & Plan    1 acute on chronic diastolic congestive heart failure-dyspnea resolved. Echo shows severe proximal septal thickening with severe systolic anterior motion of the mitral valve and elevated LVOT gradient.  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 She became hypotensive previously following vigorous diuresis likely due to reduced preload with worsening of outflow gradient.  Tolerating lopressor; change to toprol 100 mg daily. ARB DCed to avoid decreased afterload which will exacerbate HOCM physiology. Add lasix 20 mg daily as needed for worsening dyspnea or edema (would like to avoid reducing preload which will exacerbate HOCM physiology). Will arrange cardiac MRI as outpt. Can consider disopyramide if  needed.  If she does not improve with medical therapy would need to consider myectomy.  Note no h/o syncope and no family h/o sudden death. Will need ETT following DC to evaluate SBP with exercise; will also need holters in the future (no NSVT noted on telemetry).  Note labs ordered by pulmonary include elevated aldolase, ESR and positive A. Pullulans; will need to discuss further WU and FU with pulmonary.  2 hypertension-will change to toprol 100 mg daily. Avoid ARB as outlined above.  3 anemia-WU per primary care.  Ok to DC from a cardiac standpoint and fu with me in 2-4 weeks   For questions or updates, please contact [place] Please consult [email or web address] for contact info under Cardiology/STEMI.      Signed, Kirk Ruths, MD  10/13/2017, 9:46 AM

## 2017-10-13 NOTE — Discharge Summary (Signed)
Physician Discharge Summary  Hannah Colon MPN:[id number] DOB: 06-20-1956 DOA: 10/07/2017  PCP: Elisabeth Cara, PA-C  Admit date: 10/07/2017 Discharge date: 10/13/2017  Time spent: 65 minutes  Recommendations for Outpatient Follow-up:  1. Follow-up with Dr. Chase Caller 11/10/2017 at 11:15 AM for follow-up on worsening interstitial lung disease. 2. Follow-up with Dr. Kirk Ruths cardiology.  Office will call with follow-up appointment time and date.  Patient will need a basic metabolic profile done to follow-up on electrolytes and renal function.  Patient's HOCM and heart failure issues will need to be followed up upon. 3. Follow-up with [place], [place], PA-C in 3 weeks.   Discharge Diagnoses:  Principal Problem:   Acute respiratory failure with hypoxia (HCC) Active Problems:   Essential hypertension   Heart murmur   Hypoxia   Hypotension   (HFpEF) heart failure with preserved ejection fraction (HCC)   Acute on chronic respiratory failure (HCC)   Chest pain   Anemia   Interstitial lung disease ([place])   Discharge Condition: Stable and improved.  Diet recommendation: Heart Healthy  Filed Weights   10/11/17 0500 10/12/17 0431 10/13/17 0358  Weight: 61.5 kg (135 lb 8 oz) 62.4 kg (137 lb 9.1 oz) 59.2 kg (130 lb 8.2 oz)    History of present illness:  Per Dr Oletha Cruel is a 62 y.o. female with medical history significant of heart failure with preserved ejection fraction, hypertension, hyperlipidemia who presented after recent hospital admission for heart failure with recurrent cough shortness of breath.  She noted that she felt okay when she went home on Tuesday.  On Wednesday she noticed worsening shortness of breath and hypoxia on her pulse ox.  She has been sleeping sitting up for the last 2 months.  Chest pain with cough breathing.  Around 630 the morning of admission, she noticed coughing fits which did not improve with a cough medicine readily.  She noted  her cough was nonproductive without blood.  She denied any fevers, chills, sick contacts or recent travel.  She was recently admitted to the hospital.  She noted the chest pain was a dull ache, substernal, comes and goes and was associated with her cough.  She denied any abdominal pain, lower extremity-swelling, dysuria.  ED Course: Labs, CXR, HCAP abx, hospitalist for admission    Hospital Course:  Patient was admitted on 10/07/2017 10/08/2017 patient was taken to the ICU and was admitted by PCCM. She was found to have new hypoxic respiratory failure requiring 5 L of oxygen. CT done showed worsening groundglass opacity of the lungs. She was also very hypotensive and was started on Levophed. She had a CT scan of the chest done on 1/5 on the way back from CT scan patient developed dyspnea chest pain and hypoxemia. Her blood pressure dropped was placed on Levophed and was placed on nonrebreather mask. This was initially thought to be an allergic reaction to dye.Echocardiogram showed ejection fraction 65% with grade 2 systolic dysfunction. 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 She became hypotensive with aggressive diuresis. It was thought to be the reason for her hypotension. she was also treated with hydrocortisone IV. 10/09/2017 she was taken off levophed. She was able to tolerate breathing without oxygen. Chest x-ray showed much improvement. 10/10/2017 patient seen by cardiology recommended that we have to be very careful with diuresing this patient with outflow tract obstructionwith HOCM.She was started on metoprolol 25 mg twice a day as tolerated. ARB should be avoided as it would decrease  the afterload and exacerbate HOCM. They had plans to do a cardiac MRI once she was completely improved. If she did not improve with medical therapy they are considering myectomy. 10/11/2017 patient awake and alert resting in bed feels her breathing has  significantly improved.  #1 acute hypoxic respiratory failure/cardiogenic pulmonary edema/acute on chronic diastolic heart failure/worsening interstitial lung disease See above.  Patient had presented with recurrent acute hypoxic respiratory failure felt likely to be multifactorial mainly from cardiogenic pulmonary edema and worsening interstitial lung disease.  Patient was seen in consultation by pulmonary and cardiology and it was felt that patient's cardiac etiology was the main cause of patient's symptoms.  She was noted on 2D echo to have a hyperdynamic left ventricular function with severe proximal septal thickening, grade 2 diastolic dysfunction, left ventricular outflow tract gradient, severe S.A.M. and probable moderate mitral regurgitation.  Patient was seen in consultation by cardiology and patient's beta-blocker dose was adjusted to Toprol-XL 100 mg daily.  ARB has been discontinued in order to avoid a decrease in afterload which per cardiology will exacerbate HOCM physiology.  Respiratory viral panel obtained on admission was negative.  Urine strep pneumococcus antigen was negative.  CCP antibodies negative.  Sed rate elevated at 60.  GBM antibody was negative.  Rheumatoid factor was 12.5. A Pullulans antibody was positive. Patient will need to follow-up with pulmonary in the outpatient setting and may need a BAL and possible transbronchial biopsy at that time in the outpatient setting.  Patient will follow up with both cardiology and pulmonary in the outpatient setting.  Patient improved clinically during the hospitalization  that by day of discharge patient sats were 95% on room air.   2.  Hypertension Patient's metoprolol was changed to Toprol-XL per cardiology and dose adjusted to 100 mg daily.   Avoid ARB as this will exacerbate HOCM physiology by decreasing afterload.  Blood pressure remained stable.  Outpatient follow-up.  3.  Anemia Anemia panel consistent with anemia of chronic  disease.  H&H has been stable.  Minimized lab draws and blood work was obtained with pediatric bottles.  Hemoglobin stabilized at 9.9 by day of discharge.     Procedures:  CT angiogram chest 10/07/2017  Chest x-ray 10/07/2017, 10/09/2017  2D echo 10/08/2017      Consultations:  Cardiology: Dr. Caryl Comes 10/09/2017  PCCM: Dr. Chase Caller 10/08/2016  PCCM: Dr. Gilford Raid 10/07/2016      Discharge Exam: Vitals:   10/13/17 0358 10/13/17 1320  BP: 111/64 130/75  Pulse: 66 82  Resp: 16 14  Temp: 97.7 F (36.5 C) 98.4 F (36.9 C)  SpO2: 97% 95%    General: NAD Cardiovascular: RRR WITH 3/6 SEM Respiratory: CTAB  Discharge Instructions   Discharge Instructions    Diet - low sodium heart healthy   Complete by:  As directed    Increase activity slowly   Complete by:  As directed      Allergies as of 10/13/2017      Reactions   Peanut Oil Swelling   Contrast Media [iodinated Diagnostic Agents]    hypotension   Penicillins Hives, Rash   Has patient had a PCN reaction causing immediate rash, facial/tongue/throat swelling, SOB or lightheadedness with hypotension: No Has patient had a PCN reaction causing severe rash involving mucus membranes or skin necrosis: No Has patient had a PCN reaction that required hospitalization: No Has patient had a PCN reaction occurring within the last 10 years: Yes If all of the above answers are "NO", then  may proceed with Cephalosporin use. Tolerates Cefepime 10/2017.      Medication List    STOP taking these medications   losartan 25 MG tablet Commonly known as:  COZAAR   metoprolol tartrate 50 MG tablet Commonly known as:  LOPRESSOR     TAKE these medications   acetaminophen 500 MG tablet Commonly known as:  TYLENOL Take 1,000 mg by mouth every 6 (six) hours as needed for mild pain, moderate pain or headache.   aspirin 81 MG EC tablet Take 1 tablet (81 mg total) by mouth daily.   atorvastatin 20 MG tablet Commonly known as:   LIPITOR Take 20 mg by mouth daily after breakfast.   azelastine 0.1 % nasal spray Commonly known as:  ASTELIN Place 1 spray into both nostrils 2 (two) times daily.   benzonatate 100 MG capsule Commonly known as:  TESSALON Take 1 capsule (100 mg total) by mouth 3 (three) times daily as needed for cough.   fluticasone 50 MCG/ACT nasal spray Commonly known as:  FLONASE Place 1 spray into both nostrils daily.   furosemide 20 MG tablet Commonly known as:  LASIX Take 1 tablet (20 mg total) by mouth daily as needed. What changed:    medication strength  how much to take  when to take this  reasons to take this   ipratropium-albuterol 0.5-2.5 (3) MG/3ML Soln Commonly known as:  DUONEB Take 3 mLs by nebulization every 6 (six) hours as needed.   metoprolol succinate 100 MG 24 hr tablet Commonly known as:  TOPROL-XL Take 1 tablet (100 mg total) by mouth daily. Take with or immediately following a meal. Start taking on:  10/14/2017   multivitamin with minerals Tabs tablet Take 1 tablet by mouth daily.   OVER THE COUNTER MEDICATION Place 3 drops under the tongue daily. Allergy drops made up at Dr office   potassium chloride 10 MEQ tablet Commonly known as:  K-DUR Take 1 tablet (10 mEq total) by mouth daily. Take along with lasix   VOLTAREN 1 % Gel Generic drug:  diclofenac sodium Apply 1 application topically daily as needed (hand pain).      Allergies  Allergen Reactions  . Peanut Oil Swelling  . Contrast Media [Iodinated Diagnostic Agents]     hypotension  . Penicillins Hives and Rash    Has patient had a PCN reaction causing immediate rash, facial/tongue/throat swelling, SOB or lightheadedness with hypotension: No Has patient had a PCN reaction causing severe rash involving mucus membranes or skin necrosis: No Has patient had a PCN reaction that required hospitalization: No Has patient had a PCN reaction occurring within the last 10 years: Yes If all of the above  answers are "NO", then may proceed with Cephalosporin use. Tolerates Cefepime 10/2017.    Follow-up Information    Brand Males, MD Follow up on 11/10/2017.   Specialty:  Pulmonary Disease Why:  at 1115  Contact information: [place] [address] [phone]        Lelon Perla, MD Follow up.   Specialty:  Cardiology Why:  the office will call you with date and time.  if you do not here by Tuesday next week then call and ask for Debra his nurse.   Contact information: [address] [place] [address] [place], [place], [place]. Schedule an appointment as soon as possible for a visit in 3 week(s).   Specialty:  Family Medicine  Contact information: [address] [place] [address] [place] [address] [phone]            The results of significant diagnostics from this hospitalization (including imaging, microbiology, ancillary and laboratory) are listed below for reference.    Significant Diagnostic Studies: Dg Chest 2 View  Result Date: 10/07/2017 CLINICAL DATA:  Central chest pain for 2-3 weeks, has slept upright for past 2 nights, dry cough, shortness of breath, chest pains got very bad early this morning, history hypertension, diastolic heart failure EXAM: CHEST  2 VIEW COMPARISON:  10/02/2017 FINDINGS: Upper normal heart size with slight pulmonary vascular congestion. Mediastinal contours normal. Minimal chronic peribronchial thickening. Lungs clear. No definite infiltrate, pleural effusion or pneumothorax. Bones demineralized with chronic compression deformity of a lower thoracic vertebra. IMPRESSION: Minimal chronic peribronchial thickening without acute infiltrate. Electronically Signed   By: Lavonia Dana M.D.   On: 10/07/2017 12:14   Dg Chest 2 View  Result Date: 10/02/2017 CLINICAL DATA:  Central chest pain and shortness of breath EXAM: CHEST  2 VIEW COMPARISON:  May 21, 2017 FINDINGS: The heart size and  mediastinal contours are within normal limits. Both lungs are clear. Chronic compression deformity of a lower thoracic vertebral bodies unchanged. IMPRESSION: No active cardiopulmonary disease. Electronically Signed   By: Abelardo Diesel M.D.   On: 10/02/2017 21:10   Ct Angio Chest Pe W Or Wo Contrast  Result Date: 10/07/2017 CLINICAL DATA:  Short of breath and hypoxia. Suspect viral illness. Rule out pulmonary embolism. EXAM: CT ANGIOGRAPHY CHEST WITH CONTRAST TECHNIQUE: Multidetector CT imaging of the chest was performed using the standard protocol during bolus administration of intravenous contrast. Multiplanar CT image reconstructions and MIPs were obtained to evaluate the vascular anatomy. CONTRAST:  16m ISOVUE-370 IOPAMIDOL (ISOVUE-370) INJECTION 76% COMPARISON:  Plain films of earlier today.  CT of 05/21/2017. FINDINGS: Cardiovascular: The quality of this exam for evaluation of pulmonary embolism is good. No evidence of pulmonary embolism. Bovine arch. Aortic and branch vessel atherosclerosis. Mild cardiomegaly, without pericardial effusion. Mediastinum/Nodes: Borderline enlarged right paratracheal node is similar, 10 mm. subcarinal node measures 1.4 cm on image 43/series 4 versus 1.5 cm on the prior. Upper normal. No hilar adenopathy. Small prevascular nodes are not significantly changed. Lungs/Pleura: No pleural fluid. Again identified is somewhat heterogeneous but diffuse ground-glass and less so airspace opacity. The appearance is relatively similar to 05/21/2017. Upper Abdomen: Normal imaged portions of the liver, spleen, stomach, pancreas, gallbladder, biliary tract, adrenal glands, kidneys. Musculoskeletal: T10 moderate compression deformity is similar. Review of the MIP images confirms the above findings. IMPRESSION: 1.  No evidence of pulmonary embolism. 2. Persistent or recurrent heterogeneous ground-glass and minimal airspace disease bilaterally. Differential considerations include  viral/atypical bacterial infection or noncardiogenic (given absence of pleural fluid) pulmonary edema. 3.  Aortic Atherosclerosis (ICD10-I70.0). Electronically Signed   By: KAbigail MiyamotoM.D.   On: 10/07/2017 17:08   Dg Chest Port 1 View  Result Date: 10/09/2017 CLINICAL DATA:  ACUTE RESPIRATORY FAILURE. EXAM: PORTABLE CHEST 1 VIEW COMPARISON:  10/07/2017. FINDINGS: The heart size and mediastinal contours are within normal limits. Aeration is improved with some clearing of BILATERAL pulmonary opacities. The visualized skeletal structures are unremarkable. Central venous catheter lies with its tip in the proximal RIGHT atrium. IMPRESSION: Improved aeration.  No change central venous catheter. Electronically Signed   By: JStaci RighterM.D.   On: 10/09/2017 14:36   Dg Chest Portable 1 View  Result Date: 10/07/2017 CLINICAL DATA:  Initial evaluation  for central line placement. EXAM: PORTABLE CHEST 1 VIEW COMPARISON:  Prior radiograph from earlier the same day. FINDINGS: Right IJ approach centra venous catheter has been retracted, with tip now overlying the proximal right atrium. Stable cardiac and mediastinal silhouettes. Lungs hypoinflated. Diffuse vascular congestion with interstitial prominence, favored to reflect edema, similar to previous. No pleural effusion. No new focal infiltrates. No pneumothorax. Osseous structures are unchanged. IMPRESSION: 1. Interval retraction of right IJ approach centra venous catheter, tip now overlying the proximal right atrium. 2. Otherwise stable appearance of the chest with diffuse bilateral airspace disease. Electronically Signed   By: Jeannine Boga M.D.   On: 10/07/2017 22:26   Dg Chest Portable 1 View  Result Date: 10/07/2017 CLINICAL DATA:  Central line placement EXAM: PORTABLE CHEST 1 VIEW COMPARISON:  Earlier the same day FINDINGS: 2015 hours. Right IJ central line tip projects inferior to the heart, in the region of the intrahepatic IVC. This could be pulled  back 10-11 cm for positioning in the region of the SVC/RA junction. After repositioning, tip position could be confirmed with a repeat chest x-ray. The diffuse bilateral airspace disease is similar to prior. Heart size upper normal and stable. The visualized bony structures of the thorax are intact. Telemetry leads overlie the chest. IMPRESSION: Right IJ central line tip projects inferior to the heart, overlying the IVC. This could be pulled back 10-11 cm for more appropriate positioning and reconfirmed with another chest x-ray. No pneumothorax. Similar appearance diffuse bilateral airspace disease. I personally called this result to Dr. Rex Kras, in the emergency department, at 2046 hours on 10/07/2017. Electronically Signed   By: Misty Stanley M.D.   On: 10/07/2017 20:47   Dg Chest Port 1 View  Result Date: 10/07/2017 CLINICAL DATA:  62 y/o F; hypoxia. Recent hospitalization for congestive heart failure. EXAM: PORTABLE CHEST 1 VIEW COMPARISON:  10/07/2016 chest radiograph and CT chest. FINDINGS: Stable cardiac silhouette given projection and technique. Diffusely increased hazy opacities and reticular opacities of the lungs. No definite pleural effusion. No pneumothorax. Bones are unremarkable IMPRESSION: Diffusely increased hazy and reticular opacities of the lungs may represent pulmonary edema or atypical pneumonia. Electronically Signed   By: Kristine Garbe M.D.   On: 10/07/2017 18:04    Microbiology: Recent Results (from the past 240 hour(s))  Blood culture (routine x 2)     Status: None   Collection Time: 10/07/17  2:48 PM  Result Value Ref Range Status   Specimen Description BLOOD LEFT ANTECUBITAL  Final   Special Requests   Final    BOTTLES DRAWN AEROBIC AND ANAEROBIC Blood Culture adequate volume   Culture   Final    NO GROWTH 5 DAYS Performed at [facility], [address], [place], [place] [address]    Report Status 10/12/2017 FINAL  Final  Blood culture (routine x 2)      Status: None   Collection Time: 10/07/17  3:04 PM  Result Value Ref Range Status   Specimen Description BLOOD LEFT HAND  Final   Special Requests IN PEDIATRIC BOTTLE Blood Culture adequate volume  Final   Culture   Final    NO GROWTH 5 DAYS Performed at [facility], [place] [address], [place], [place] [address]    Report Status 10/12/2017 FINAL  Final  MRSA PCR Screening     Status: None   Collection Time: 10/08/17  1:05 AM  Result Value Ref Range Status   MRSA by PCR NEGATIVE NEGATIVE Final  Comment:        The GeneXpert MRSA Assay (FDA approved for NASAL specimens only), is one component of a comprehensive MRSA colonization surveillance program. It is not intended to diagnose MRSA infection nor to guide or monitor treatment for MRSA infections.   Respiratory Panel by PCR     Status: None   Collection Time: 10/08/17 10:26 AM  Result Value Ref Range Status   Adenovirus NOT DETECTED NOT DETECTED Final   Coronavirus 229E NOT DETECTED NOT DETECTED Final   Coronavirus HKU1 NOT DETECTED NOT DETECTED Final   Coronavirus NL63 NOT DETECTED NOT DETECTED Final   Coronavirus OC43 NOT DETECTED NOT DETECTED Final   Metapneumovirus NOT DETECTED NOT DETECTED Final   Rhinovirus / Enterovirus NOT DETECTED NOT DETECTED Final   Influenza A NOT DETECTED NOT DETECTED Final   Influenza B NOT DETECTED NOT DETECTED Final   Parainfluenza Virus 1 NOT DETECTED NOT DETECTED Final   Parainfluenza Virus 2 NOT DETECTED NOT DETECTED Final   Parainfluenza Virus 3 NOT DETECTED NOT DETECTED Final   Parainfluenza Virus 4 NOT DETECTED NOT DETECTED Final   Respiratory Syncytial Virus NOT DETECTED NOT DETECTED Final   Bordetella pertussis NOT DETECTED NOT DETECTED Final   Chlamydophila pneumoniae NOT DETECTED NOT DETECTED Final   Mycoplasma pneumoniae NOT DETECTED NOT DETECTED Final    Comment: Performed at [facility], [place] [address] [place], [place] [address]     Labs: Basic Metabolic  Panel: Recent Labs  Lab 10/07/17 1206 10/08/17 0055 10/09/17 0230 10/10/17 0446 10/11/17 0408 10/13/17 0421  NA 132* 135 133* 137  --  138  K 3.7 3.6 3.7 4.0  --  4.1  CL 101 102 102 107  --  105  CO2 _0 --  27  GLUCOSE 162* 193* 171* 99  --  104*  BUN _1 --  14  CREATININE 0.61 0.80 0.83 0.73 0.55 0.68  CALCIUM 9.2 9.0 8.6* 8.7*  --  9.5  MG  --  1.7 2.1 1.9 1.9  --   PHOS  --  4.4 3.0 2.1* 4.2  --    Liver Function Tests: No results for input(s): AST, ALT, ALKPHOS, BILITOT, PROT, ALBUMIN in the last 168 hours. No results for input(s): LIPASE, AMYLASE in the last 168 hours. No results for input(s): AMMONIA in the last 168 hours. CBC: Recent Labs  Lab 10/08/17 0055 10/09/17 0230 10/10/17 0446 10/11/17 0408 10/13/17 0421  WBC 28.3* 24.1* 11.7* 8.2 7.4  NEUTROABS  --  21.5* 8.1* 4.7 4.2  HGB 9.7* 8.4* 7.9* 8.9* 9.9*  HCT 29.0* 24.8* 24.3* 26.7* 30.8*  MCV 88.7 89.9 91.0 91.4 91.9  PLT 411* 391 371 422* 480*   Cardiac Enzymes: Recent Labs  Lab 10/07/17 1850 10/07/17 1909 10/08/17 0050 10/08/17 0632 10/08/17 1100  CKTOTAL  --   --   --   --  25*  CKMB  --   --   --   --  9.0*  TROPONINI 0.04* <0.03 0.15* 0.22*  --    BNP: BNP (last 3 results) Recent Labs    09/30/17 0940 10/03/17 0026 10/07/17 1206  BNP 125.8* 162.9* 98.8    ProBNP (last 3 results) No results for input(s): PROBNP in the last 8760 hours.  CBG: Recent Labs  Lab 10/12/17 1158 10/12/17 1700 10/12/17 2133 10/13/17 0805 10/13/17 1211  GLUCAP 82 87 117* 82 90       Signed:  Quillian Quince  Grandville Silos MD.  [facility] 10/13/2017, 3:11 PM

## 2017-10-21 ENCOUNTER — Encounter: Payer: Self-pay | Admitting: Cardiology

## 2017-10-21 NOTE — Telephone Encounter (Signed)
This encounter was created in error - please disregard.

## 2017-10-21 NOTE — Telephone Encounter (Signed)
New Message  Pt call requesting to speak with RN about getting a sooner post hospital f/u appt with ONLY Dr. Jens Somrenshaw.

## 2017-10-25 NOTE — Progress Notes (Signed)
HPI: Follow-up diastolic congestive heart failure and hypertrophic obstructive cardiomyopathy.  Previously followed by Dr. Duke Salviaandolph.  Nuclear study August 2018 showed ejection fraction 70% and normal perfusion.  Echocardiogram January 2019 showed ejection fraction 65-70%, severe left ventricular hypertrophy, systolic anterior motion of the mitral valve with dynamic outflow obstruction with mean gradient 29 mmHg, grade 2 diastolic dysfunction, mild to moderate mitral regurgitation, severe left atrial enlargement and mild right atrial enlargement.  CTA January 2019 showed no pulmonary embolus.  Patient was admitted January 2019 with increased congestive heart failure.  She was vigorously diuresed and became hypotensive requiring pressors.  It was felt that decrease preload exacerbated her LVOT gradient.  Diuretics were held and Toprol was increased. ARB discontinued as decreased afterload would exacerbate outflow obstruction. She was discharged on low-dose Lasix at 20 mg daily as needed for worsening dyspnea.  Plan was to arrange outpatient MRI to rule out hypertrophic obstructive cardiomyopathy.  There was also note that if symptoms persisted despite maximizing beta blockade patient may require myectomy.  It was also noted she would require exercise treadmill to evaluate systolic blood pressure response to exercise and outpatient Holter monitor.  Patient noted to have elevated aldolase, sed rate and positive A. Pullulans; was to fu with pulmonary for these issues. Since last seen, patient states she has good days and bad days.  She has more good than bad and on bad days describes increased dyspnea on exertion and fatigue.  She also notes a cough.  She has taken 3 Lasix over the past 2 weeks.  She denies chest pain or syncope.  Current Outpatient Medications  Medication Sig Dispense Refill  . acetaminophen (TYLENOL) 500 MG tablet Take 1,000 mg by mouth every 6 (six) hours as needed for mild pain,  moderate pain or headache.    Marland Kitchen. aspirin EC 81 MG EC tablet Take 1 tablet (81 mg total) by mouth daily. 30 tablet 0  . atorvastatin (LIPITOR) 20 MG tablet Take 20 mg by mouth daily after breakfast.     . azelastine (ASTELIN) 0.1 % nasal spray Place 1 spray into both nostrils 2 (two) times daily.    . benzonatate (TESSALON) 100 MG capsule Take 1 capsule (100 mg total) by mouth 3 (three) times daily as needed for cough. 20 capsule 0  . fluticasone (FLONASE) 50 MCG/ACT nasal spray Place 1 spray into both nostrils daily.    . furosemide (LASIX) 20 MG tablet Take 1 tablet (20 mg total) by mouth daily as needed. 30 tablet 0  . metoprolol succinate (TOPROL-XL) 100 MG 24 hr tablet Take 1 tablet (100 mg total) by mouth daily. Take with or immediately following a meal. 30 tablet 1  . Multiple Vitamin (MULTIVITAMIN WITH MINERALS) TABS tablet Take 1 tablet by mouth daily.    Marland Kitchen. OVER THE COUNTER MEDICATION Place 3 drops under the tongue daily. Allergy drops made up at Dr office    . potassium chloride (K-DUR) 10 MEQ tablet Take 1 tablet (10 mEq total) by mouth daily. Take along with lasix 30 tablet 2  . VOLTAREN 1 % GEL Apply 1 application topically daily as needed (hand pain).      No current facility-administered medications for this visit.      Past Medical History:  Diagnosis Date  . Diastolic heart failure (HCC)   . Environmental and seasonal allergies   . High cholesterol   . Hypertension   . Hypertensive heart disease 05/13/2017    Past Surgical  History:  Procedure Laterality Date  . ABDOMINAL HYSTERECTOMY    . SHOULDER ARTHROSCOPY Right     Social History   Socioeconomic History  . Marital status: Married    Spouse name: Not on file  . Number of children: Not on file  . Years of education: Not on file  . Highest education level: Not on file  Social Needs  . Financial resource strain: Not on file  . Food insecurity - worry: Not on file  . Food insecurity - inability: Not on file  .  Transportation needs - medical: Not on file  . Transportation needs - non-medical: Not on file  Occupational History  . Occupation: accounting  Tobacco Use  . Smoking status: Never Smoker  . Smokeless tobacco: Never Used  Substance and Sexual Activity  . Alcohol use: Yes    Alcohol/week: 2.4 - 3.0 oz    Types: 4 - 5 Glasses of wine per week  . Drug use: No  . Sexual activity: Not on file  Other Topics Concern  . Not on file  Social History Narrative  . Not on file    Family History  Problem Relation Age of Onset  . Dementia Mother   . Asthma Maternal Grandmother   . Heart failure Maternal Uncle     ROS: Fatigue but no fevers or chills, productive cough, hemoptysis, dysphasia, odynophagia, melena, hematochezia, dysuria, hematuria, rash, seizure activity, orthopnea, PND, pedal edema, claudication. Remaining systems are negative.  Physical Exam: Well-developed well-nourished in no acute distress.  Skin is warm and dry.  HEENT is normal.  Neck is supple.  Chest is clear to auscultation with normal expansion.  Cardiovascular exam is regular rate and rhythm.  2/6 systolic murmur that increases with Valsalva. Abdominal exam nontender or distended. No masses palpated. Extremities show no edema. neuro grossly intact   A/P  1 hypertrophic obstructive cardiomyopathy-patient's recent echocardiogram suggest hypertrophic cardiomyopathy.  Her symptoms have improved but persist.  I will increase Toprol to 150 mg daily.  She will continue Lasix 20 mg daily only as needed.  I would like to avoid decreasing preload which would exacerbate her LVOT gradient.  Note her ARB was also discontinued because decreasing afterload would cause the same issue.  We will arrange a cardiac MRI to further assess.  I will also arrange an exercise treadmill to evaluate systolic blood pressure with exercise and a 24-hour Holter to make sure she does not have nonsustained ventricular tachycardia.  There is no  family history of sudden cardiac death and she has no history of syncope.  Finally if she has worsening symptoms despite these measures we could consider the addition of disopyramide or referral for myectomy.  2 chronic diastolic congestive heart failure-patient likely has dyspnea and CHF symptoms related to HOCM  Continue Lasix as needed.  Follow low-sodium diet and fluid restrict.  3 hypertension-blood pressure elevated.  Increase Toprol to 150 mg daily.  4 abnormal inflammatory laboratories drawn in the hospital recently including elevated aldolase, sed rate and positive A Pullulans-follow-up pulmonary as scheduled.  It appears to me her recent hospitalization was likely secondary to pulmonary edema.  Olga Millers, MD

## 2017-10-26 ENCOUNTER — Encounter: Payer: Self-pay | Admitting: Cardiology

## 2017-10-26 ENCOUNTER — Ambulatory Visit (INDEPENDENT_AMBULATORY_CARE_PROVIDER_SITE_OTHER): Payer: 59 | Admitting: Cardiology

## 2017-10-26 VITALS — BP 154/77 | HR 84 | Ht 60.0 in | Wt 132.1 lb

## 2017-10-26 DIAGNOSIS — I5032 Chronic diastolic (congestive) heart failure: Secondary | ICD-10-CM | POA: Diagnosis not present

## 2017-10-26 DIAGNOSIS — I10 Essential (primary) hypertension: Secondary | ICD-10-CM

## 2017-10-26 DIAGNOSIS — I421 Obstructive hypertrophic cardiomyopathy: Secondary | ICD-10-CM

## 2017-10-26 MED ORDER — METOPROLOL SUCCINATE ER 50 MG PO TB24: 50.0000 mg | ORAL_TABLET | Freq: Every day | ORAL | 3 refills | Status: DC

## 2017-10-26 NOTE — Patient Instructions (Signed)
Medication Instructions:   INCREASE METOPROLOL TO 150 MG ONCE DAILY= 1 OF THE 100 MG TABLET AND 1 OF THE 50 MG TABLETS AT THE SAME TIME ONCE DAILY  Testing/Procedures:  Your physician has requested that you have an exercise tolerance test. For further information please visit [email or web address] Please also follow instruction sheet, as given.   Your physician has recommended that you wear a 24 HOUR holter monitor. Holter monitors are medical devices that record the heart's electrical activity. Doctors most often use these monitors to diagnose arrhythmias. Arrhythmias are problems with the speed or rhythm of the heartbeat. The monitor is a small, portable device. You can wear one while you do your normal daily activities. This is usually used to diagnose what is causing palpitations/syncope (passing out).  Your physician has requested that you have a cardiac MRI. Cardiac MRI uses a computer to create images of your heart as its beating, producing both still and moving pictures of your heart and major blood vessels. For further information please visit [email or web address]. Please follow the instruction sheet given to you today for more information.      Follow-Up:  Your physician recommends that you schedule a follow-up appointment in: 3-4 WEEKS WITH DR Jens SomRENSHAW

## 2017-10-31 ENCOUNTER — Encounter: Payer: Self-pay | Admitting: Cardiology

## 2017-10-31 NOTE — Progress Notes (Signed)
HPI: Follow-up diastolic congestive heart failure and hypertrophic obstructive cardiomyopathy.  Nuclear study August 2018 showed ejection fraction 70% and normal perfusion.  Echocardiogram January 2019 showed ejection fraction 65-70%, severe left ventricular hypertrophy, systolic anterior motion of the mitral valve with dynamic outflow obstruction with mean gradient 29 mmHg, grade 2 diastolic dysfunction, mild to moderate mitral regurgitation, severe left atrial enlargement and mild right atrial enlargement.  CTA January 2019 showed no pulmonary embolus. Cardiac MRI February 2019 consistent with hypertrophic obstructive cardiomyopathy.  Holter monitor January 2019 showed no nonsustained ventricular tachycardia. Exercise treadmill January 2019 showed normal systolic blood pressure response.  Note patient had increased congestive heart failure symptoms and hypotension with vigorous diuresis in the past likely due to to decrease preload exacerbating LVOT gradient.  Since last seen,  she feels better.  She has some dyspnea on exertion but improved.  No orthopnea, PND, pedal edema, chest pain or syncope.  Current Outpatient Medications  Medication Sig Dispense Refill  . acetaminophen (TYLENOL) 500 MG tablet Take 1,000 mg by mouth every 6 (six) hours as needed for mild pain, moderate pain or headache.    Marland Kitchen. aspirin EC 81 MG EC tablet Take 1 tablet (81 mg total) by mouth daily. 30 tablet 0  . atorvastatin (LIPITOR) 20 MG tablet Take 20 mg by mouth daily after breakfast.     . azelastine (ASTELIN) 0.1 % nasal spray Place 1 spray into both nostrils 2 (two) times daily.    . fluticasone (FLONASE) 50 MCG/ACT nasal spray Place 1 spray into both nostrils daily.    . furosemide (LASIX) 20 MG tablet Take 1 tablet (20 mg total) by mouth daily as needed. 30 tablet 0  . metoprolol succinate (TOPROL-XL) 100 MG 24 hr tablet Take 1 tablet (100 mg total) by mouth daily. Take with or immediately following a meal. 30  tablet 1  . metoprolol succinate (TOPROL-XL) 50 MG 24 hr tablet Take 1 tablet (50 mg total) by mouth daily. 90 tablet 3  . Multiple Vitamin (MULTIVITAMIN WITH MINERALS) TABS tablet Take 1 tablet by mouth daily.    Marland Kitchen. OVER THE COUNTER MEDICATION Place 3 drops under the tongue daily. Allergy drops made up at Dr office    . potassium chloride (K-DUR) 10 MEQ tablet Take 1 tablet (10 mEq total) by mouth daily. Take along with lasix 30 tablet 2  . VOLTAREN 1 % GEL Apply 1 application topically daily as needed (hand pain).      No current facility-administered medications for this visit.      Past Medical History:  Diagnosis Date  . Diastolic heart failure (HCC)   . Environmental and seasonal allergies   . High cholesterol   . Hypertension   . Hypertensive heart disease 05/13/2017    Past Surgical History:  Procedure Laterality Date  . ABDOMINAL HYSTERECTOMY    . SHOULDER ARTHROSCOPY Right     Social History   Socioeconomic History  . Marital status: Married    Spouse name: Not on file  . Number of children: Not on file  . Years of education: Not on file  . Highest education level: Not on file  Social Needs  . Financial resource strain: Not on file  . Food insecurity - worry: Not on file  . Food insecurity - inability: Not on file  . Transportation needs - medical: Not on file  . Transportation needs - non-medical: Not on file  Occupational History  . Occupation: Audiological scientistaccounting  Tobacco  Use  . Smoking status: Never Smoker  . Smokeless tobacco: Never Used  Substance and Sexual Activity  . Alcohol use: Yes    Alcohol/week: 2.4 - 3.0 oz    Types: 4 - 5 Glasses of wine per week  . Drug use: No  . Sexual activity: Not on file  Other Topics Concern  . Not on file  Social History Narrative  . Not on file    Family History  Problem Relation Age of Onset  . Dementia Mother   . Asthma Maternal Grandmother   . Heart failure Maternal Uncle     ROS: no fevers or chills,  productive cough, hemoptysis, dysphasia, odynophagia, melena, hematochezia, dysuria, hematuria, rash, seizure activity, orthopnea, PND, pedal edema, claudication. Remaining systems are negative.  Physical Exam: Well-developed well-nourished in no acute distress.  Skin is warm and dry.  HEENT is normal.  Neck is supple.  Chest is clear to auscultation with normal expansion.  Cardiovascular exam is regular rate and rhythm.  2/6 systolic murmur Abdominal exam nontender or distended. No masses palpated. Extremities show no edema. neuro grossly intact  A/P  1 hypertrophic obstructive cardiomyopathy-echocardiogram and MRI are consistent with hypertrophic cardiomyopathy.  Her exercise treadmill showed normal LV systolic blood pressure response and her Holter showed no nonsustained ventricular tachycardia.  She has not had syncope and there is no family history of sudden death.  Her symptoms have improved with higher dose of Toprol.  We will continue.  We will advance in the future if possible but blood pressure is borderline.  I will also continue Lasix 20 mg daily as needed.  I would like to avoid diuresis if possible as this would exacerbate her LVOT gradient by decreasing preload.  Her ARB was discontinued previously to avoid afterload reduction and exacerbation of LVOT gradient.  I have instructed patient that her sister will need to be screened.  We can consider disopyramide or referral for myectomy in the future if necessary.  2 chronic diastolic congestive heart failure-patient's symptoms are reasonably well controlled.  Continue Lasix 20 mg daily as needed.  Continue low-sodium diet and fluid restriction.  3 hypertension-blood pressure is controlled.  Continue present medications.  4 abnormal inflammatory laboratories-Drawn previously in hospital.  Follow-up pulmonary.  5 preoperative evaluation prior to resection of lesion on finger-this will require local anesthesia and not general.  She  may proceed.  Olga Millers, MD

## 2017-11-02 ENCOUNTER — Ambulatory Visit (INDEPENDENT_AMBULATORY_CARE_PROVIDER_SITE_OTHER): Payer: 59

## 2017-11-02 ENCOUNTER — Other Ambulatory Visit: Payer: Self-pay | Admitting: Cardiology

## 2017-11-02 DIAGNOSIS — I421 Obstructive hypertrophic cardiomyopathy: Secondary | ICD-10-CM

## 2017-11-02 DIAGNOSIS — I493 Ventricular premature depolarization: Secondary | ICD-10-CM | POA: Diagnosis not present

## 2017-11-03 LAB — EXERCISE TOLERANCE TEST
CSEPED: 6 min
CSEPEDS: 42 s
CSEPEW: 8 METS
CSEPHR: 66 %
CSEPPHR: 106 {beats}/min
MPHR: 160 {beats}/min
RPE: 20
Rest HR: 20 {beats}/min

## 2017-11-04 ENCOUNTER — Ambulatory Visit (HOSPITAL_COMMUNITY)
Admission: RE | Admit: 2017-11-04 | Discharge: 2017-11-04 | Disposition: A | Discharge status: Home / Self Care | Payer: 59 | Source: Ambulatory Visit | Attending: Cardiology | Admitting: Cardiology

## 2017-11-04 DIAGNOSIS — I421 Obstructive hypertrophic cardiomyopathy: Secondary | ICD-10-CM

## 2017-11-04 DIAGNOSIS — I422 Other hypertrophic cardiomyopathy: Secondary | ICD-10-CM | POA: Diagnosis not present

## 2017-11-04 MED ORDER — GADOBENATE DIMEGLUMINE 529 MG/ML IV SOLN
20.0000 mL | Freq: Once | INTRAVENOUS | Status: AC | PRN
  Administered 2017-11-04: 20 mL via INTRAVENOUS

## 2017-11-09 ENCOUNTER — Ambulatory Visit (INDEPENDENT_AMBULATORY_CARE_PROVIDER_SITE_OTHER): Payer: 59 | Admitting: Cardiology

## 2017-11-09 ENCOUNTER — Encounter: Payer: Self-pay | Admitting: Cardiology

## 2017-11-09 VITALS — BP 119/73 | HR 63 | Ht 60.0 in | Wt 129.4 lb

## 2017-11-09 DIAGNOSIS — I421 Obstructive hypertrophic cardiomyopathy: Secondary | ICD-10-CM

## 2017-11-09 DIAGNOSIS — I10 Essential (primary) hypertension: Secondary | ICD-10-CM

## 2017-11-09 DIAGNOSIS — I5030 Unspecified diastolic (congestive) heart failure: Secondary | ICD-10-CM | POA: Diagnosis not present

## 2017-11-09 NOTE — Patient Instructions (Signed)
Your physician recommends that you schedule a follow-up appointment in: 12 WEEKS WITH DR Jens SomRENSHAW

## 2017-11-10 ENCOUNTER — Ambulatory Visit (INDEPENDENT_AMBULATORY_CARE_PROVIDER_SITE_OTHER)
Admission: RE | Admit: 2017-11-10 | Discharge: 2017-11-10 | Disposition: A | Discharge status: Home / Self Care | Payer: 59 | Source: Ambulatory Visit | Attending: Internal Medicine | Admitting: Internal Medicine

## 2017-11-10 ENCOUNTER — Ambulatory Visit (INDEPENDENT_AMBULATORY_CARE_PROVIDER_SITE_OTHER): Payer: 59 | Admitting: Internal Medicine

## 2017-11-10 ENCOUNTER — Encounter: Payer: Self-pay | Admitting: Internal Medicine

## 2017-11-10 VITALS — BP 122/62 | HR 72 | Ht 60.0 in | Wt 130.6 lb

## 2017-11-10 DIAGNOSIS — J9621 Acute and chronic respiratory failure with hypoxia: Secondary | ICD-10-CM | POA: Diagnosis not present

## 2017-11-10 DIAGNOSIS — R0989 Other specified symptoms and signs involving the circulatory and respiratory systems: Secondary | ICD-10-CM

## 2017-11-10 NOTE — Patient Instructions (Addendum)
ICD-10-CM   1. Acute on chronic respiratory failure with hypoxia (HCC) J96.21   2. Bibasilar crackles R09.89     Clinically off oxygen and Improved but crackles stil present Unclear to me if you have ILD or crackles from heart; currently leaning this is a heart issue  Plan Do cxr 2 view will call with results   Followup 2 months for clinical followup ; simple walk test (not 6mwd) at followup

## 2017-11-10 NOTE — Progress Notes (Signed)
   Subjective:    Patient ID: Hannah Colon, female    DOB: 1956/07/30, 62 y.o.   MRN: [id number]  HPI    Review of Systems  Constitutional: Negative for fever and unexpected weight change.  HENT: Positive for postnasal drip and sneezing. Negative for congestion, dental problem, ear pain, nosebleeds, rhinorrhea, sinus pressure, sore throat and trouble swallowing.   Eyes: Negative for redness and itching.  Respiratory: Positive for cough and shortness of breath. Negative for chest tightness and wheezing.   Cardiovascular: Negative for palpitations and leg swelling.  Gastrointestinal: Negative for nausea and vomiting.  Genitourinary: Negative for dysuria.  Musculoskeletal: Negative for joint swelling.  Skin: Negative for rash.  Allergic/Immunologic: Positive for environmental allergies and food allergies. Negative for immunocompromised state.  Neurological: Negative for headaches.  Hematological: Does not bruise/bleed easily.  Psychiatric/Behavioral: Negative for dysphoric mood. The patient is not nervous/anxious.        Objective:   Physical Exam        Assessment & Plan:

## 2017-11-10 NOTE — Progress Notes (Signed)
Subjective:     Patient ID: Hannah Colon, female   DOB: January 31, 1956, 62 y.o.   MRN: [id number]  HPI   OV 11/10/2017  Chief Complaint  Patient presents with  . Advice Only    Referred by Anders SimmondsPete Babcock, PA. Pt was in hosp. 1/4-1/10 for acute resp failure with hypoxia but states that she was mainly in there because of heart problems.  Pt does currently have a dry cough and states she is SOB today.     Hannah Colon presents for follow-up from the hospital.  She was admitted with acute hypoxemic respiratory failure with diffuse groundglass opacities.  She had his ground glass opacities on the CT scan in January 2019 but also towards the end of 2018.  Therefore interstitial lung disease was suspected.  She underwent autoimmune workup but this was negative.  At that time we did an echocardiogram and it showed severe hypertrophic obstructive cardiomyopathy and with the treatment of this hypoxemia improved and a chest x-ray improved.  But given the chronicity of the chest x-ray findings the presence of ILD was still entertained.  Therefore she presents for follow-up.  Currently she is improved.  She is able to do her bed.  She sees Dr. Jens Somrenshaw for cardiology follow-up.  Her metoprolol has been increased.  She is slowly gaining her strength.  She is doing activities of daily living such as making the bed.  She still can get short of breath when she exerts.  Today when I exerted her she did not desaturate to significance but she did drop below 3 points suggesting the presence of interstitial infiltrates.  The exam did show crackles.  Walking desaturation test on 11/10/2017 185 feet x 3 laps on ROOM AIR:  did not desaturate. Rest pulse ox was 99%, final pulse ox was 91%. HR response 76/min at rest to 84/min at peak exertion. Patient Hannah Colon  Did not Desaturate < 88% . Hannah Colon did yes  Desaturated </= 3% points. Hannah Colon did not get tachyardic    has a past medical history of Diastolic  heart failure (HCC), Environmental and seasonal allergies, High cholesterol, Hypertension, and Hypertensive heart disease (05/13/2017).   reports that  has never smoked. she has never used smokeless tobacco.  Past Surgical History:  Procedure Laterality Date  . ABDOMINAL HYSTERECTOMY    . SHOULDER ARTHROSCOPY Right     Allergies  Allergen Reactions  . Peanut Oil Swelling  . Contrast Media [Iodinated Diagnostic Agents]     hypotension  . Penicillins Hives and Rash    Has patient had a PCN reaction causing immediate rash, facial/tongue/throat swelling, SOB or lightheadedness with hypotension: No Has patient had a PCN reaction causing severe rash involving mucus membranes or skin necrosis: No Has patient had a PCN reaction that required hospitalization: No Has patient had a PCN reaction occurring within the last 10 years: Yes If all of the above answers are "NO", then may proceed with Cephalosporin use. Tolerates Cefepime 10/2017.      There is no immunization history on file for this patient.  Family History  Problem Relation Age of Onset  . Dementia Mother   . Asthma Maternal Grandmother   . Heart failure Maternal Uncle      Current Outpatient Medications:  .  acetaminophen (TYLENOL) 500 MG tablet, Take 1,000 mg by mouth every 6 (six) hours as needed for mild pain, moderate pain or headache., Disp: , Rfl:  .  aspirin EC 81 MG EC tablet,  Take 1 tablet (81 mg total) by mouth daily., Disp: 30 tablet, Rfl: 0 .  atorvastatin (LIPITOR) 20 MG tablet, Take 20 mg by mouth daily after breakfast. , Disp: , Rfl:  .  azelastine (ASTELIN) 0.1 % nasal spray, Place 1 spray into both nostrils 2 (two) times daily., Disp: , Rfl:  .  fluticasone (FLONASE) 50 MCG/ACT nasal spray, Place 1 spray into both nostrils daily., Disp: , Rfl:  .  furosemide (LASIX) 20 MG tablet, Take 1 tablet (20 mg total) by mouth daily as needed., Disp: 30 tablet, Rfl: 0 .  metoprolol succinate (TOPROL-XL) 100 MG 24 hr  tablet, Take 1 tablet (100 mg total) by mouth daily. Take with or immediately following a meal., Disp: 30 tablet, Rfl: 1 .  metoprolol succinate (TOPROL-XL) 50 MG 24 hr tablet, Take 1 tablet (50 mg total) by mouth daily., Disp: 90 tablet, Rfl: 3 .  Multiple Vitamin (MULTIVITAMIN WITH MINERALS) TABS tablet, Take 1 tablet by mouth daily., Disp: , Rfl:  .  OVER THE COUNTER MEDICATION, Place 3 drops under the tongue daily. Allergy drops made up at Dr office, Disp: , Rfl:  .  potassium chloride (K-DUR) 10 MEQ tablet, Take 1 tablet (10 mEq total) by mouth daily. Take along with lasix, Disp: 30 tablet, Rfl: 2 .  VOLTAREN 1 % GEL, Apply 1 application topically daily as needed (hand pain). , Disp: , Rfl:    Review of Systems     Objective:   Physical Exam  Constitutional: She is oriented to person, place, and time. She appears well-developed and well-nourished. No distress.  HENT:  Head: Normocephalic and atraumatic.  Right Ear: External ear normal.  Left Ear: External ear normal.  Mouth/Throat: Oropharynx is clear and moist. No oropharyngeal exudate.  Eyes: Conjunctivae and EOM are normal. Pupils are equal, round, and reactive to light. Right eye exhibits no discharge. Left eye exhibits no discharge. No scleral icterus.  Neck: Normal range of motion. Neck supple. No JVD present. No tracheal deviation present. No thyromegaly present.  Cardiovascular: Normal rate, regular rhythm, normal heart sounds and intact distal pulses. Exam reveals no gallop and no friction rub.  No murmur heard. Pulmonary/Chest: Effort normal. No respiratory distress. She has no wheezes. She has rales. She exhibits no tenderness.  Bilateral bibasal crackls bottom 1/3  Abdominal: Soft. Bowel sounds are normal. She exhibits no distension and no mass. There is no tenderness. There is no rebound and no guarding.  Musculoskeletal: Normal range of motion. She exhibits no edema or tenderness.  Lymphadenopathy:    She has no  cervical adenopathy.  Neurological: She is alert and oriented to person, place, and time. She has normal reflexes. No cranial nerve deficit. She exhibits normal muscle tone. Coordination normal.  Skin: Skin is warm and dry. No rash noted. She is not diaphoretic. No erythema. No pallor.  Psychiatric: She has a normal mood and affect. Her behavior is normal. Judgment and thought content normal.  Vitals reviewed.  Vitals:   11/10/17 1125  BP: 122/62  Pulse: 72  SpO2: 94%  Weight: 130 lb 9.6 oz (59.2 kg)  Height: 5' (1.524 m)    Estimated body mass index is 25.51 kg/m as calculated from the following:   Height as of this encounter: 5' (1.524 m).   Weight as of this encounter: 130 lb 9.6 oz (59.2 kg).     Assessment:       ICD-10-CM   1. Acute on chronic respiratory failure with hypoxia (HCC)  J96.21 DG Chest 2 View  2. Bibasilar crackles R09.89 DG Chest 2 View    .    Plan:      She did desaturate more than 3 points although not of the significance of less than 88%.  She has a exam with crackles.  Therefore there is still some pulmonary findings.  The only thing I do not know is if this is from the heart or from the lung.  I suspect it is from the heart even though she looks euvolemic.  At this point in time I will just get a chest x-ray and will follow up expectantly.  We will see her again in 2 months.  At some point depending on her clinical condition will get a high-resolution CT scan of the chest supine and prone.  She and husband agreeable with this plan  (> 50% of this 15 min visit spent in face to face counseling or/and coordination of care)   Dr. Kalman Shan, M.D., [facility].C.P Pulmonary and Critical Care Medicine Staff Physician, [facility] Center Director - Interstitial Lung Disease  Program  Pulmonary Fibrosis [facility] [place] [facility] at [facility] [place], [place], [address]  Pager: [phone], If no answer or between  15:00h - 7:00h: call [phone] Telephone: [phone]

## 2017-11-15 ENCOUNTER — Telehealth: Payer: Self-pay | Admitting: Internal Medicine

## 2017-11-15 NOTE — Telephone Encounter (Signed)
Patient aware , nothing further needed.  °

## 2017-11-15 NOTE — Telephone Encounter (Signed)
MR please advise on patients results, thanks!

## 2017-11-15 NOTE — Telephone Encounter (Signed)
11/10/17 - cxr is clear. So I think what happened in hospital was all heart.  Plan Keep followup appt as per recent OV

## 2017-11-20 ENCOUNTER — Encounter: Payer: Self-pay | Admitting: Cardiology

## 2017-11-21 ENCOUNTER — Telehealth: Payer: Self-pay | Admitting: Cardiology

## 2017-11-21 NOTE — Telephone Encounter (Signed)
New message    Patient calling to confirm if it is safe for her to take Tramadol HCL for pain after finger surgery ; along with her heart medications.

## 2017-11-21 NOTE — Telephone Encounter (Signed)
Spoke with pt, aware okay per pharm md okay to use tramadol.

## 2017-11-22 ENCOUNTER — Telehealth: Payer: Self-pay | Admitting: Cardiology

## 2017-11-22 HISTORY — PX: FINGER SURGERY: SHX640

## 2017-11-22 MED ORDER — METOPROLOL SUCCINATE ER 200 MG PO TB24: 200.0000 mg | ORAL_TABLET | Freq: Every day | ORAL | 3 refills | Status: DC

## 2017-11-22 NOTE — Telephone Encounter (Signed)
Returned call to husband (ok per DPR) who states they saw Dr. Jens Somrenshaw on 2/6 and patient was doing well.   Since appointment patient is experiencing orthopnea, dry cough and fatigue.   Reports having to prop herself up at night with pillows.  Denies SOB otherwise, denies swelling/edema.   Reports she has taken her lasix and K x 3 doses.  HH RN was there last night and her lungs were clear.   Reports discussing with Dr. Jens Somrenshaw the possibility of increasing her Toprol XL to 200 mg daily and was wondering if this should be done now?   Patient reports BP has been "in her normal range" and reports her HR has been running higher than usual (80s). Patient reports having surgery on her finger this AM.   Patient does report taking robitussin DM for the past week as well for cough.   Advised patient to avoid the "DM", ok to take plain robitussin.   Husband would like patient to be seen Wed if possible in [place].   Advised I would route to Dr. Jens Somrenshaw and primary nurse to review.

## 2017-11-22 NOTE — Telephone Encounter (Signed)
Spoke with pt husband, Aware of dr crenshaw's recommendations. New script sent to the pharmacy  

## 2017-11-22 NOTE — Telephone Encounter (Signed)
Patient husband Magazine features editor(Dow) calling, states that he would like to discuss some concerns with you in regards to changes in patient health.

## 2017-11-22 NOTE — Telephone Encounter (Signed)
Ok to increase toprol to 200 mg daily and follow BP and HR Hannah MillersBrian Crenshaw

## 2017-12-02 ENCOUNTER — Encounter: Payer: Self-pay | Admitting: Cardiovascular Disease

## 2017-12-06 ENCOUNTER — Telehealth: Payer: Self-pay | Admitting: Cardiology

## 2017-12-06 NOTE — Telephone Encounter (Signed)
SPOKE TO PATIENT. SHE STATES SHE IS HAVING BOUTS OF FEELING VERY FATIGUE , NO ENERGY AND COUGHING. PATIENT STATES SHE WENT TO URGENT CARE RECEIVED ANTIBIOTIC AND CHEST X-RAY WAS GOOD.  PATIENT DECLINE TO GO T PRIMARY . SHE THINKS ALL THIS IS STEMMING FROM HER HEART.  NO CHEST PAIN . JUST A COUGH , NO ENERGY. RN REVIEWED RECENT ECHO.     METOPROLOL WAS INCREASED TO 200 MG DAILY AND HE TAKES IT IN THE MORNING. OFFERED AN APPOINTMENT WITH AN EXTENDER . PATIENT STATES SHE DOES KNOW IF FROM DAY TO DAY HOW SHE WILL FEEL.    PATIENT WANTED RN TO  DISCUSS WITH DR CRENSHAW , POSSIBLE ADD TO HIS SCHEDULE. PATIENT AWARE WILL DEFER TO DR CRENSHAW.

## 2017-12-06 NOTE — Telephone Encounter (Signed)
New message     Having a problem with a real bad cough and feeling very fatigued, Sunday night and Monday the cough is really bad , she keeps going to urgent care and they gave her antibiotics, it gets better then it gets worse again

## 2017-12-06 NOTE — Telephone Encounter (Signed)
Spoke with pt, she is having SOB with activities within the home. She denies any edema but c/o orthopnea for the last 2 nights and has had to prop up on 2 pillows. The cough is congested but non-productive. She took 20 mg of furosemide yesterday and today and the cough goes away in the morning but is back by the afternoon. Her weight is up 1 lb overnight. Her bp is running 99/63 to 100/61 with pulse of 75-82 bpm. Advised the patient to take another 20 mg of furosemide this afternoon. She will take the metoprolol in the evenings.  Will forward for dr Jens Somcrenshaw review

## 2017-12-07 NOTE — Telephone Encounter (Signed)
Add on ov [facility] Aid[facility] Sierria Bruney

## 2017-12-07 NOTE — Telephone Encounter (Signed)
Spoke with pt, Follow up scheduled with dr crenshaw. 

## 2017-12-08 ENCOUNTER — Telehealth: Payer: Self-pay | Admitting: *Deleted

## 2017-12-08 ENCOUNTER — Ambulatory Visit (INDEPENDENT_AMBULATORY_CARE_PROVIDER_SITE_OTHER): Payer: 59 | Admitting: Cardiology

## 2017-12-08 ENCOUNTER — Encounter: Payer: Self-pay | Admitting: Cardiology

## 2017-12-08 VITALS — BP 118/66 | HR 62 | Ht 60.0 in | Wt 128.0 lb

## 2017-12-08 DIAGNOSIS — I5030 Unspecified diastolic (congestive) heart failure: Secondary | ICD-10-CM

## 2017-12-08 DIAGNOSIS — I421 Obstructive hypertrophic cardiomyopathy: Secondary | ICD-10-CM

## 2017-12-08 DIAGNOSIS — I10 Essential (primary) hypertension: Secondary | ICD-10-CM | POA: Diagnosis not present

## 2017-12-08 MED ORDER — FUROSEMIDE 20 MG PO TABS: 20.0000 mg | ORAL_TABLET | Freq: Every day | ORAL | 3 refills | Status: DC

## 2017-12-08 NOTE — Telephone Encounter (Signed)
Records faxed to New Patient Coordinator for Dr[facility] at [facility]---office will call patient to schedule

## 2017-12-08 NOTE — Patient Instructions (Signed)
Medication Instructions:   TAKE FUROSEMIDE 20 MG ONCE DAILY  Labwork:  Your physician recommends that you return for lab work in: ONE WEEK  Follow-Up:  Your physician recommends that you schedule a follow-up appointment in: WITH DR CRENSHAW AS SCHEDULED  REFERRAL TO DR Nolon RodANDREW WANG AT [facility] FOR HOCM

## 2017-12-08 NOTE — Progress Notes (Signed)
HPI: Follow-up diastolic congestive heart failure and hypertrophic obstructive cardiomyopathy. Nuclear study August 2018 showed ejection fraction 70% and normal perfusion. Echocardiogram January 2019 showed ejection fraction 65-70%, severe left ventricular hypertrophy, systolic anterior motion of the mitral valve with dynamic outflow obstruction with mean gradient 29 mmHg, grade 2 diastolic dysfunction,mild to moderate mitral regurgitation, severe left atrial enlargement and mild right atrial enlargement. CTA January 2019 showed no pulmonary embolus. Cardiac MRI February 2019 consistent with hypertrophic obstructive cardiomyopathy.  Holter monitor January 2019 showed no nonsustained ventricular tachycardia. Exercise treadmill January 2019 showed normal systolic blood pressure response.  Note patient had increased congestive heart failure symptoms and hypotension with vigorous diuresis in the past likely due to to decrease preload exacerbating LVOT gradient. Toprol increased recently to 200 mg daily.  Since last seen,patient has "good days and bad days".  She will feel good for 3-4 days and then develop increasing dyspnea on exertion and fatigue.  No chest pain, palpitations or syncope.  She will take her Lasix for 2 days and then feel improved.  Current Outpatient Medications  Medication Sig Dispense Refill  . acetaminophen (TYLENOL) 500 MG tablet Take 1,000 mg by mouth every 6 (six) hours as needed for mild pain, moderate pain or headache.    Marland Kitchen aspirin EC 81 MG EC tablet Take 1 tablet (81 mg total) by mouth daily. 30 tablet 0  . atorvastatin (LIPITOR) 20 MG tablet Take 20 mg by mouth daily after breakfast.     . azelastine (ASTELIN) 0.1 % nasal spray Place 1 spray into both nostrils 2 (two) times daily.    . fluticasone (FLONASE) 50 MCG/ACT nasal spray Place 1 spray into both nostrils daily.    . furosemide (LASIX) 20 MG tablet Take 1 tablet (20 mg total) by mouth daily as needed. 30 tablet  0  . metoprolol succinate (TOPROL-XL) 200 MG 24 hr tablet Take 1 tablet (200 mg total) by mouth daily. Take with or immediately following a meal. 90 tablet 3  . Multiple Vitamin (MULTIVITAMIN WITH MINERALS) TABS tablet Take 1 tablet by mouth daily.    Marland Kitchen OVER THE COUNTER MEDICATION Place 3 drops under the tongue daily. Allergy drops made up at Dr office    . potassium chloride (K-DUR) 10 MEQ tablet Take 1 tablet (10 mEq total) by mouth daily. Take along with lasix 30 tablet 2  . VOLTAREN 1 % GEL Apply 1 application topically daily as needed (hand pain).      No current facility-administered medications for this visit.      Past Medical History:  Diagnosis Date  . Diastolic heart failure (HCC)   . Environmental and seasonal allergies   . High cholesterol   . Hypertension   . Hypertensive heart disease 05/13/2017    Past Surgical History:  Procedure Laterality Date  . ABDOMINAL HYSTERECTOMY    . SHOULDER ARTHROSCOPY Right     Social History   Socioeconomic History  . Marital status: Married    Spouse name: Not on file  . Number of children: Not on file  . Years of education: Not on file  . Highest education level: Not on file  Social Needs  . Financial resource strain: Not on file  . Food insecurity - worry: Not on file  . Food insecurity - inability: Not on file  . Transportation needs - medical: Not on file  . Transportation needs - non-medical: Not on file  Occupational History  . Occupation: Audiological scientist  Tobacco Use  . Smoking status: Never Smoker  . Smokeless tobacco: Never Used  Substance and Sexual Activity  . Alcohol use: Yes    Alcohol/week: 2.4 - 3.0 oz    Types: 4 - 5 Glasses of wine per week  . Drug use: No  . Sexual activity: Not on file  Other Topics Concern  . Not on file  Social History Narrative  . Not on file    Family History  Problem Relation Age of Onset  . Dementia Mother   . Asthma Maternal Grandmother   . Heart failure Maternal Uncle      ROS: no fevers or chills, productive cough, hemoptysis, dysphasia, odynophagia, melena, hematochezia, dysuria, hematuria, rash, seizure activity, orthopnea, PND, pedal edema, claudication. Remaining systems are negative.  Physical Exam: Well-developed well-nourished in no acute distress.  Skin is warm and dry.  HEENT is normal.  Neck is supple.  Chest is clear to auscultation with normal expansion.  Cardiovascular exam is regular rate and rhythm.  2/6 systolic murmur left sternal border that increases with Valsalva. Abdominal exam nontender or distended. No masses palpated. Extremities show no edema. neuro grossly intact  ECG-sinus rhythm at a rate of 62.  Left ventricular hypertrophy.  Personally reviewed  A/P  1 hypertrophic cardiomyopathy-patient continues to have intermittent dyspnea on exertion and fatigue.  I have increased her Toprol to 200 mg daily and she takes this nightly.  We will continue at present dose.  If she develops problems with dizziness or hypotension we will lower to 150 mg daily.  I will add Lasix 20 mg daily to see if this improves her symptoms.  Check potassium and renal function in 1 week.  She is having persistent symptoms despite attempts at maximization of medications.  [facility].  Question addition of disopyramide or consideration of myectomy.  2 chronic diastolic congestive heart failure-patient notes improvement when she takes her Lasix.  As above we will treat with Lasix 20 mg daily.  She understands low-sodium diet and fluid restriction.  3 hypertension-blood pressure is controlled.  Continue present medications.  4 question interstitial lung disease-followed by pulmonary.  Olga MillersBrian Crenshaw, MD

## 2017-12-16 LAB — BASIC METABOLIC PANEL
BUN/Creatinine Ratio: 13 (ref 12–28)
BUN: 11 mg/dL (ref 8–27)
CO2: 23 mmol/L (ref 20–29)
CREATININE: 0.87 mg/dL (ref 0.57–1.00)
Calcium: 10.8 mg/dL — ABNORMAL HIGH (ref 8.7–10.3)
Chloride: 101 mmol/L (ref 96–106)
GFR, EST AFRICAN AMERICAN: 84 mL/min/{1.73_m2} (ref >59)
GFR, EST NON AFRICAN AMERICAN: 73 mL/min/{1.73_m2} (ref >59)
Glucose: 80 mg/dL (ref 65–99)
POTASSIUM: 4.4 mmol/L (ref 3.5–5.2)
SODIUM: 141 mmol/L (ref 134–144)

## 2017-12-21 ENCOUNTER — Telehealth: Payer: Self-pay | Admitting: Cardiology

## 2017-12-21 NOTE — Addendum Note (Signed)
Addended by: Neoma LamingPUGH, Lory Nowaczyk J on: 12/21/2017 12:07 PM   Modules accepted: Orders

## 2017-12-21 NOTE — Telephone Encounter (Addendum)
Received call from patient.She stated she has had a non productive cough worse this week.She increased metoprolol to 200 mg daily 2 weeks ago and she thought that was causing cough.She wanted Dr.Crenshaw's advice.She also wanted to let him know she has appointment with Vernelle [facility] for advice.

## 2017-12-21 NOTE — Telephone Encounter (Signed)
Returned call to patient Dr.Crenshaw's advice given.Advised to call back if not better.

## 2017-12-21 NOTE — Telephone Encounter (Signed)
Returned call to patient no answer.LMTC. 

## 2017-12-21 NOTE — Telephone Encounter (Signed)
Patient calling,   States that she has a persistent cough, she is taking lasix. Patient is taking robitussin medication. She asks if she needs to increase the lasix or what should she do?

## 2017-12-21 NOTE — Telephone Encounter (Signed)
Take additional 20 mg lasix daily as needed for dyspnea and cough Hannah Colon

## 2017-12-26 ENCOUNTER — Telehealth: Payer: Self-pay | Admitting: Cardiology

## 2017-12-26 MED ORDER — METOPROLOL SUCCINATE ER 100 MG PO TB24: 150.0000 mg | ORAL_TABLET | Freq: Every day | ORAL | 3 refills | Status: DC

## 2017-12-26 NOTE — Telephone Encounter (Signed)
New Message  Pt c/o Shortness Of Breath: STAT if SOB developed within the last 24 hours or pt is noticeably SOB on the phone  1. Are you currently SOB (can you hear that pt is SOB on the phone)? Yes, pt says ok to send message  2. How long have you been experiencing SOB? Through the weekend  3. Are you SOB when sitting or when up moving around? both  4. Are you currently experiencing any other symptoms?  Low oxygen, low energy, and a cough

## 2017-12-26 NOTE — Telephone Encounter (Signed)
Spoke with pt, Aware of dr crenshaw's recommendations.  Follow up scheduled  

## 2017-12-26 NOTE — Telephone Encounter (Signed)
Spoke with patient and she has increased shortness of breath which is worse with exertion. Denies any swelling, stated never has any. She has been taking Lasix 40 mg daily, Toprol 200 mg daily, and K+10 meq daily. She did increase her Lasix last week and this only helped for about 1 day. She has dry cough, so bad last night she threw up. For 3 nights she has been sleeping sitting up. Her O2 levels are usually starting out in the 90's in am and then dropping to mid 80's in the afternoons. Yesterday morning her O2 was 88% and dropped to 82%. This am her blood pressure was 101/60 with her am medications. Yesterday blood pressure down to 83/55 but did came up to 100/62. Per patient her blood pressure is always low. She does have an appointment with Dr Modesto CharonWong but not until 01/30/2018. Will forward to Dr Jens Somrenshaw for review.

## 2017-12-26 NOTE — Telephone Encounter (Signed)
Change toprol to 150 mg daily; paov Olga MillersBrian Kearstyn Avitia

## 2017-12-26 NOTE — Telephone Encounter (Signed)
Left message to call back  

## 2017-12-26 NOTE — Telephone Encounter (Signed)
F/U Call: ° °Patient returning call °

## 2017-12-27 ENCOUNTER — Telehealth: Payer: Self-pay | Admitting: Cardiology

## 2017-12-27 NOTE — Telephone Encounter (Signed)
Faxed STD Forms to the [employer]. Spoke to patient. Patient is aware forms have been faxed. Mailing copy to patient. 12/27/17 ab

## 2017-12-27 NOTE — Progress Notes (Signed)
HPI: Follow-up diastolic congestive heart failure and hypertrophic obstructive cardiomyopathy. Nuclear study August 2018 showed ejection fraction 70% and normal perfusion. Echocardiogram January 2019 showed ejection fraction 65-70%, severe left ventricular hypertrophy, systolic anterior motion of the mitral valve with dynamic outflow obstruction with mean gradient 29 mmHg, grade 2 diastolic dysfunction,mild to moderate mitral regurgitation, severe left atrial enlargement and mild right atrial enlargement. CTA January 2019 showed no pulmonary embolus.Cardiac MRI February 2019 consistent with hypertrophic obstructive cardiomyopathy. Holter monitor January 2019 showed no nonsustained ventricular tachycardia. Exercise treadmill January 2019 showed normal systolic blood pressure response. Note patient had increased congestive heart failure symptoms and hypotension with vigorous diuresis in the past likely due to to decrease preload exacerbating LVOT gradient. Lasix added at last ov. Toprol recently decreased due to low BP. Since last seen,patient has worsening dyspnea on exertion.  She has orthopnea and increased cough.  She denies pedal edema, exertional chest pain, syncope or bleeding.  Current Outpatient Medications  Medication Sig Dispense Refill  . acetaminophen (TYLENOL) 500 MG tablet Take 1,000 mg by mouth every 6 (six) hours as needed for mild pain, moderate pain or headache.    Marland Kitchen aspirin EC 81 MG EC tablet Take 1 tablet (81 mg total) by mouth daily. 30 tablet 0  . atorvastatin (LIPITOR) 20 MG tablet Take 20 mg by mouth daily after breakfast.     . azelastine (ASTELIN) 0.1 % nasal spray Place 1 spray into both nostrils 2 (two) times daily.    . fluticasone (FLONASE) 50 MCG/ACT nasal spray Place 1 spray into both nostrils daily.    . furosemide (LASIX) 20 MG tablet Take 20 mg by mouth 2 (two) times daily.    . metoprolol succinate (TOPROL-XL) 50 MG 24 hr tablet Take 150 mg by mouth  daily. Take with or immediately following a meal.    . Multiple Vitamin (MULTIVITAMIN WITH MINERALS) TABS tablet Take 1 tablet by mouth daily.    Marland Kitchen OVER THE COUNTER MEDICATION Place 3 drops under the tongue daily. Allergy drops made up at Dr office    . potassium chloride (K-DUR) 10 MEQ tablet Take 1 tablet (10 mEq total) by mouth daily. Take along with lasix 30 tablet 2  . VOLTAREN 1 % GEL Apply 1 application topically daily as needed (hand pain).      No current facility-administered medications for this visit.      Past Medical History:  Diagnosis Date  . Diastolic heart failure (HCC)   . Environmental and seasonal allergies   . High cholesterol   . Hypertension   . Hypertensive heart disease 05/13/2017    Past Surgical History:  Procedure Laterality Date  . ABDOMINAL HYSTERECTOMY    . SHOULDER ARTHROSCOPY Right     Social History   Socioeconomic History  . Marital status: Married    Spouse name: Not on file  . Number of children: Not on file  . Years of education: Not on file  . Highest education level: Not on file  Occupational History  . Occupation: Audiological scientist  Social Needs  . Financial resource strain: Not on file  . Food insecurity:    Worry: Not on file    Inability: Not on file  . Transportation needs:    Medical: Not on file    Non-medical: Not on file  Tobacco Use  . Smoking status: Never Smoker  . Smokeless tobacco: Never Used  Substance and Sexual Activity  . Alcohol use: Yes  Alcohol/week: 2.4 - 3.0 oz    Types: 4 - 5 Glasses of wine per week  . Drug use: No  . Sexual activity: Not on file  Lifestyle  . Physical activity:    Days per week: Not on file    Minutes per session: Not on file  . Stress: Not on file  Relationships  . Social connections:    Talks on phone: Not on file    Gets together: Not on file    Attends religious service: Not on file    Active member of club or organization: Not on file    Attends meetings of clubs or  organizations: Not on file    Relationship status: Not on file  . Intimate partner violence:    Fear of current or ex partner: Not on file    Emotionally abused: Not on file    Physically abused: Not on file    Forced sexual activity: Not on file  Other Topics Concern  . Not on file  Social History Narrative  . Not on file    Family History  Problem Relation Age of Onset  . Dementia Mother   . Asthma Maternal Grandmother   . Heart failure Maternal Uncle     ROS: no fevers or chills, productive cough, hemoptysis, dysphasia, odynophagia, melena, hematochezia, dysuria, hematuria, rash, seizure activity, orthopnea, PND, pedal edema, claudication. Remaining systems are negative.  Physical Exam: Well-developed well-nourished in no acute distress.  Skin is warm and dry.  HEENT is normal.  Neck is supple.  Chest is clear to auscultation with normal expansion.  Cardiovascular exam is regular rate and rhythm.  3/6 systolic murmur left sternal border increasing with Valsalva. Abdominal exam nontender or distended. No masses palpated. Extremities show no edema. neuro grossly intact  Electrocardiogram personally reviewed.  Sinus with left ventricular hypertrophy and nonspecific ST changes.  A/P  1 hypertrophic cardiomyopathy-patient is symptomatically much worse.  We attempted to increase Toprol to 200 mg daily but her blood pressure did not tolerate.  We will continue 150 mg daily.  I did increase her Lasix to 20 mg twice daily but she continues with dyspnea.  Note higher doses of diuretic in the past caused hypotension presumably from decreased preload and worsening LVOT obstruction.  Her saturation in the office upon arrival was 81% and on recheck at rest 85%.  She also has crackles on examination.  Options for further medical therapy seem limited.  I cannot advance beta-blockade as blood pressure is limiting.  We could consider disopyramide if available.  However I think we will  ultimately likely need to refer for septal myectomy.  She is scheduled to see Dr. Regino Schultze at [facility] at the end of April.  However I do not think given her hypoxia she can continue as an outpatient at this point.  We will admit and diurese gently as tolerated.  We should be careful not to over diurese as this caused significant hypotension during previous hospitalization.  In the interim I will attempt to contact Dr. Regino Schultze to see if she can be transferred for further management.  I will check a PA and lateral chest x-ray.  There was a question of interstitial lung disease contributing in the past though she improved markedly with diuresis.  Check BNP.  2 chronic diastolic congestive heart failure-as outlined above I will diurese as tolerated.  Begin with 20 mg IV twice daily.  Follow I's and O's.  Continue low-sodium diet and fluid restriction.  3 hypertension-blood pressure is controlled.  Cannot advance beta-blocker as this is causing hypotension.  4 question interstitial lung disease-check PA and lateral chest x-ray.  Check sed rate.  I think this is all likely secondary to congestive heart failure related to hypertrophic cardiomyopathy.  However will have a low threshold for pulmonary consult if there is any question.  Olga MillersBrian Crenshaw, MD

## 2017-12-28 ENCOUNTER — Emergency Department (HOSPITAL_BASED_OUTPATIENT_CLINIC_OR_DEPARTMENT_OTHER): Payer: 59

## 2017-12-28 ENCOUNTER — Inpatient Hospital Stay (HOSPITAL_BASED_OUTPATIENT_CLINIC_OR_DEPARTMENT_OTHER)
Admission: EM | Admit: 2017-12-28 | Discharge: 2018-01-04 | DRG: 166 | Disposition: A | Discharge status: Home / Self Care | Payer: 59 | Attending: Internal Medicine | Admitting: Internal Medicine

## 2017-12-28 ENCOUNTER — Other Ambulatory Visit: Payer: Self-pay

## 2017-12-28 ENCOUNTER — Ambulatory Visit (INDEPENDENT_AMBULATORY_CARE_PROVIDER_SITE_OTHER): Payer: 59 | Admitting: Cardiology

## 2017-12-28 ENCOUNTER — Encounter: Payer: Self-pay | Admitting: Cardiology

## 2017-12-28 VITALS — BP 106/75 | HR 97 | Ht 60.0 in | Wt 123.8 lb

## 2017-12-28 DIAGNOSIS — I10 Essential (primary) hypertension: Secondary | ICD-10-CM | POA: Diagnosis not present

## 2017-12-28 DIAGNOSIS — Z8249 Family history of ischemic heart disease and other diseases of the circulatory system: Secondary | ICD-10-CM

## 2017-12-28 DIAGNOSIS — Z880 Allergy status to penicillin: Secondary | ICD-10-CM

## 2017-12-28 DIAGNOSIS — Z91041 Radiographic dye allergy status: Secondary | ICD-10-CM | POA: Diagnosis not present

## 2017-12-28 DIAGNOSIS — I5032 Chronic diastolic (congestive) heart failure: Secondary | ICD-10-CM | POA: Diagnosis present

## 2017-12-28 DIAGNOSIS — R0602 Shortness of breath: Secondary | ICD-10-CM

## 2017-12-28 DIAGNOSIS — I080 Rheumatic disorders of both mitral and aortic valves: Secondary | ICD-10-CM | POA: Diagnosis present

## 2017-12-28 DIAGNOSIS — D72829 Elevated white blood cell count, unspecified: Secondary | ICD-10-CM

## 2017-12-28 DIAGNOSIS — R739 Hyperglycemia, unspecified: Secondary | ICD-10-CM | POA: Diagnosis present

## 2017-12-28 DIAGNOSIS — R05 Cough: Secondary | ICD-10-CM | POA: Diagnosis not present

## 2017-12-28 DIAGNOSIS — I5033 Acute on chronic diastolic (congestive) heart failure: Secondary | ICD-10-CM

## 2017-12-28 DIAGNOSIS — I110 Hypertensive heart disease with heart failure: Secondary | ICD-10-CM | POA: Diagnosis present

## 2017-12-28 DIAGNOSIS — I421 Obstructive hypertrophic cardiomyopathy: Secondary | ICD-10-CM | POA: Diagnosis present

## 2017-12-28 DIAGNOSIS — Z7982 Long term (current) use of aspirin: Secondary | ICD-10-CM | POA: Diagnosis not present

## 2017-12-28 DIAGNOSIS — R053 Chronic cough: Secondary | ICD-10-CM

## 2017-12-28 DIAGNOSIS — R011 Cardiac murmur, unspecified: Secondary | ICD-10-CM | POA: Diagnosis present

## 2017-12-28 DIAGNOSIS — R0902 Hypoxemia: Secondary | ICD-10-CM

## 2017-12-28 DIAGNOSIS — Z77120 Contact with and (suspected) exposure to mold (toxic): Secondary | ICD-10-CM

## 2017-12-28 DIAGNOSIS — I959 Hypotension, unspecified: Secondary | ICD-10-CM | POA: Diagnosis present

## 2017-12-28 DIAGNOSIS — E785 Hyperlipidemia, unspecified: Secondary | ICD-10-CM | POA: Diagnosis present

## 2017-12-28 DIAGNOSIS — I422 Other hypertrophic cardiomyopathy: Secondary | ICD-10-CM | POA: Diagnosis not present

## 2017-12-28 DIAGNOSIS — Z888 Allergy status to other drugs, medicaments and biological substances status: Secondary | ICD-10-CM | POA: Diagnosis not present

## 2017-12-28 DIAGNOSIS — J679 Hypersensitivity pneumonitis due to unspecified organic dust: Principal | ICD-10-CM | POA: Diagnosis present

## 2017-12-28 DIAGNOSIS — J9621 Acute and chronic respiratory failure with hypoxia: Secondary | ICD-10-CM | POA: Diagnosis present

## 2017-12-28 DIAGNOSIS — J849 Interstitial pulmonary disease, unspecified: Secondary | ICD-10-CM

## 2017-12-28 HISTORY — DX: Other specified symptoms and signs involving the circulatory and respiratory systems: R09.89

## 2017-12-28 HISTORY — DX: Anemia in other chronic diseases classified elsewhere: D63.8

## 2017-12-28 HISTORY — DX: Chronic diastolic (congestive) heart failure: I50.32

## 2017-12-28 LAB — DIFFERENTIAL
BASOS ABS: 0 10*3/uL (ref 0.0–0.1)
BASOS PCT: 0 %
BLASTS: 0 %
Band Neutrophils: 0 %
EOS PCT: 1 %
Eosinophils Absolute: 0.2 10*3/uL (ref 0.0–0.7)
LYMPHS PCT: 6 %
Lymphs Abs: 1.2 10*3/uL (ref 0.7–4.0)
MONOS PCT: 5 %
Metamyelocytes Relative: 0 %
Monocytes Absolute: 1 10*3/uL (ref 0.1–1.0)
Myelocytes: 0 %
NEUTROS PCT: 88 %
NRBC: 0 /100{WBCs}
Neutro Abs: 17.2 10*3/uL — ABNORMAL HIGH (ref 1.7–7.7)
Other: 0 %
Promyelocytes Absolute: 0 %

## 2017-12-28 LAB — HEPATIC FUNCTION PANEL
ALT: 14 U/L (ref 14–54)
AST: 39 U/L (ref 15–41)
Albumin: 3.9 g/dL (ref 3.5–5.0)
Alkaline Phosphatase: 86 U/L (ref 38–126)
BILIRUBIN DIRECT: 0.1 mg/dL (ref 0.1–0.5)
BILIRUBIN INDIRECT: 0.6 mg/dL (ref 0.3–0.9)
TOTAL PROTEIN: 8.4 g/dL — AB (ref 6.5–8.1)
Total Bilirubin: 0.7 mg/dL (ref 0.3–1.2)

## 2017-12-28 LAB — CBC
HCT: 39.5 % (ref 36.0–46.0)
HEMOGLOBIN: 13.2 g/dL (ref 12.0–15.0)
MCH: 28.4 pg (ref 26.0–34.0)
MCHC: 33.4 g/dL (ref 30.0–36.0)
MCV: 85.1 fL (ref 78.0–100.0)
PLATELETS: 411 10*3/uL — AB (ref 150–400)
RBC: 4.64 MIL/uL (ref 3.87–5.11)
RDW: 14.3 % (ref 11.5–15.5)
WBC: 19.6 10*3/uL — ABNORMAL HIGH (ref 4.0–10.5)

## 2017-12-28 LAB — BASIC METABOLIC PANEL
ANION GAP: 11 (ref 5–15)
BUN: 12 mg/dL (ref 6–20)
CALCIUM: 9.9 mg/dL (ref 8.9–10.3)
CO2: 21 mmol/L — ABNORMAL LOW (ref 22–32)
CREATININE: 0.75 mg/dL (ref 0.44–1.00)
Chloride: 101 mmol/L (ref 101–111)
GFR calc Af Amer: >60 mL/min (ref >60)
GLUCOSE: 117 mg/dL — AB (ref 65–99)
Potassium: 3.9 mmol/L (ref 3.5–5.1)
Sodium: 133 mmol/L — ABNORMAL LOW (ref 135–145)

## 2017-12-28 LAB — BRAIN NATRIURETIC PEPTIDE: B NATRIURETIC PEPTIDE 5: 170.7 pg/mL — AB (ref 0.0–100.0)

## 2017-12-28 LAB — TROPONIN I

## 2017-12-28 LAB — SEDIMENTATION RATE: Sed Rate: 44 mm/hr — ABNORMAL HIGH (ref 0–22)

## 2017-12-28 LAB — I-STAT CG4 LACTIC ACID, ED: LACTIC ACID, VENOUS: 1.76 mmol/L (ref 0.5–1.9)

## 2017-12-28 MED ORDER — FLUTICASONE PROPIONATE 50 MCG/ACT NA SUSP
1.0000 | Freq: Every day | NASAL | Status: DC
  Administered 2017-12-29 – 2018-01-04 (×7): 1 via NASAL
  Filled 2017-12-28 (×2): qty 16

## 2017-12-28 MED ORDER — AZELASTINE HCL 0.1 % NA SOLN
1.0000 | Freq: Two times a day (BID) | NASAL | Status: DC
  Administered 2017-12-29 – 2018-01-04 (×13): 1 via NASAL
  Filled 2017-12-28 (×2): qty 30

## 2017-12-28 MED ORDER — ATORVASTATIN CALCIUM 20 MG PO TABS
20.0000 mg | ORAL_TABLET | Freq: Every day | ORAL | Status: DC
  Administered 2017-12-29 – 2018-01-04 (×7): 20 mg via ORAL
  Filled 2017-12-28 (×8): qty 1

## 2017-12-28 MED ORDER — CALCIUM CARBONATE ANTACID 500 MG PO CHEW: 1.0000 | CHEWABLE_TABLET | Freq: Two times a day (BID) | ORAL | Status: DC | PRN

## 2017-12-28 MED ORDER — ASPIRIN 81 MG PO CHEW
81.0000 mg | CHEWABLE_TABLET | Freq: Every day | ORAL | Status: AC
  Administered 2017-12-29 – 2017-12-31 (×2): 81 mg via ORAL
  Filled 2017-12-28 (×3): qty 1

## 2017-12-28 MED ORDER — LEVOFLOXACIN 750 MG PO TABS
750.0000 mg | ORAL_TABLET | Freq: Once | ORAL | Status: AC
  Administered 2017-12-28: 750 mg via ORAL
  Filled 2017-12-28: qty 1

## 2017-12-28 MED ORDER — FUROSEMIDE 10 MG/ML IJ SOLN
20.0000 mg | Freq: Once | INTRAMUSCULAR | Status: AC
  Administered 2017-12-28: 20 mg via INTRAVENOUS
  Filled 2017-12-28: qty 2

## 2017-12-28 NOTE — H&P (Addendum)
Lewayne Buntingrenshaw, Brian S, MD  Physician  Cardiology  Progress Notes  Signed  Encounter Date:  12/28/2017       Related encounter: [facility]      Signed           [] Hide copied text  [] Hover for details        HPI: Follow-up diastolic congestive heart failure and hypertrophic obstructive cardiomyopathy. Nuclear study August 2018 showed ejection fraction 70% and normal perfusion. Echocardiogram January 2019 showed ejection fraction 65-70%, severe left ventricular hypertrophy, systolic anterior motion of the mitral valve with dynamic outflow obstruction with mean gradient 29 mmHg, grade 2 diastolic dysfunction,mild to moderate mitral regurgitation, severe left atrial enlargement and mild right atrial enlargement. CTA January 2019 showed no pulmonary embolus.Cardiac MRI February 2019 consistent with hypertrophic obstructive cardiomyopathy. Holter monitor January 2019 showed no nonsustained ventricular tachycardia. Exercise treadmill January 2019 showed normal systolic blood pressure response. Note patient had increased congestive heart failure symptoms and hypotension with vigorous diuresis in the past likely due to to decrease preload exacerbating LVOT gradient.Lasix added at last ov. Toprol recently decreased due to low BP. Since last seen,patient has worsening dyspnea on exertion.  She has orthopnea and increased cough.  She denies pedal edema, exertional chest pain, syncope or bleeding.        Current Outpatient Medications  Medication Sig Dispense Refill  . acetaminophen (TYLENOL) 500 MG tablet Take 1,000 mg by mouth every 6 (six) hours as needed for mild pain, moderate pain or headache.    Marland Kitchen. aspirin EC 81 MG EC tablet Take 1 tablet (81 mg total) by mouth daily. 30 tablet 0  . atorvastatin (LIPITOR) 20 MG tablet Take 20 mg by mouth daily after breakfast.     . azelastine (ASTELIN) 0.1 % nasal spray Place 1 spray into both  nostrils 2 (two) times daily.    . fluticasone (FLONASE) 50 MCG/ACT nasal spray Place 1 spray into both nostrils daily.    . furosemide (LASIX) 20 MG tablet Take 20 mg by mouth 2 (two) times daily.    . metoprolol succinate (TOPROL-XL) 50 MG 24 hr tablet Take 150 mg by mouth daily. Take with or immediately following a meal.    . Multiple Vitamin (MULTIVITAMIN WITH MINERALS) TABS tablet Take 1 tablet by mouth daily.    Marland Kitchen. OVER THE COUNTER MEDICATION Place 3 drops under the tongue daily. Allergy drops made up at Dr office    . potassium chloride (K-DUR) 10 MEQ tablet Take 1 tablet (10 mEq total) by mouth daily. Take along with lasix 30 tablet 2  . VOLTAREN 1 % GEL Apply 1 application topically daily as needed (hand pain).      No current facility-administered medications for this visit.          Past Medical History:  Diagnosis Date  . Diastolic heart failure (HCC)   . Environmental and seasonal allergies   . High cholesterol   . Hypertension   . Hypertensive heart disease 05/13/2017         Past Surgical History:  Procedure Laterality Date  . ABDOMINAL HYSTERECTOMY    . SHOULDER ARTHROSCOPY Right     Social History        Socioeconomic History  . Marital status: Married    Spouse name: Not on file  . Number of children: Not on file  . Years of education: Not on file  . Highest education level: Not on file  Occupational History  . Occupation: Audiological scientist  Social Needs  . Financial resource strain: Not on file  . Food insecurity:    Worry: Not on file    Inability: Not on file  . Transportation needs:    Medical: Not on file    Non-medical: Not on file  Tobacco Use  . Smoking status: Never Smoker  . Smokeless tobacco: Never Used  Substance and Sexual Activity  . Alcohol use: Yes    Alcohol/week: 2.4 - 3.0 oz    Types: 4 - 5 Glasses of wine per week  . Drug use: No  . Sexual activity: Not on file  Lifestyle  . Physical  activity:    Days per week: Not on file    Minutes per session: Not on file  . Stress: Not on file  Relationships  . Social connections:    Talks on phone: Not on file    Gets together: Not on file    Attends religious service: Not on file    Active member of club or organization: Not on file    Attends meetings of clubs or organizations: Not on file    Relationship status: Not on file  . Intimate partner violence:    Fear of current or ex partner: Not on file    Emotionally abused: Not on file    Physically abused: Not on file    Forced sexual activity: Not on file  Other Topics Concern  . Not on file  Social History Narrative  . Not on file         Family History  Problem Relation Age of Onset  . Dementia Mother   . Asthma Maternal Grandmother   . Heart failure Maternal Uncle     ROS: no fevers or chills, productive cough, hemoptysis, dysphasia, odynophagia, melena, hematochezia, dysuria, hematuria, rash, seizure activity, orthopnea, PND, pedal edema, claudication. Remaining systems are negative.  Physical Exam: Well-developed well-nourished in no acute distress.  Skin is warm and dry.  HEENT is normal.  Neck is supple.  Chest is clear to auscultation with normal expansion.  Cardiovascular exam is regular rate and rhythm.  3/6 systolic murmur left sternal border increasing with Valsalva. Abdominal exam nontender or distended. No masses palpated. Extremities show no edema. neuro grossly intact  Electrocardiogram personally reviewed.  Sinus with left ventricular hypertrophy and nonspecific ST changes.  A/P  1 hypertrophic cardiomyopathy-patient is symptomatically much worse.  We attempted to increase Toprol to 200 mg daily but her blood pressure did not tolerate.  We will continue 150 mg daily.  I did increase her Lasix to 20 mg twice daily but she continues with dyspnea.  Note higher doses of diuretic in the past caused hypotension  presumably from decreased preload and worsening LVOT obstruction.  Her saturation in the office upon arrival was 81% and on recheck at rest 85%.  She also has crackles on examination.  Options for further medical therapy seem limited.  I cannot advance beta-blockade as blood pressure is limiting.  We could consider disopyramide if available.  However I think we will ultimately likely need to refer for septal myectomy.  She is scheduled to see Dr. Regino Schultze at [facility] at the end of April.  However I do not think given her hypoxia she can continue as an outpatient at this point.  We will admit and diurese gently as tolerated.  We should be careful not to over diurese as this caused significant hypotension during previous hospitalization.  In the interim I will attempt to contact Dr. Regino Schultze to see if she can be transferred for further management.  I will check a PA and lateral chest x-ray.  There was a question of interstitial lung disease contributing in the past though she improved markedly with diuresis.  Check BNP.  2 chronic diastolic congestive heart failure-as outlined above I will diurese as tolerated.  Begin with 20 mg IV twice daily.  Follow I's and O's.  Continue low-sodium diet and fluid restriction.  3 hypertension-blood pressure is controlled.  Cannot advance beta-blocker as this is causing hypotension.  4 question interstitial lung disease-check PA and lateral chest x-ray.  Check sed rate.  I think this is all likely secondary to congestive heart failure related to hypertrophic cardiomyopathy.  However will have a low threshold for pulmonary consult if there is any question.  Olga Millers, MD  Addendum-chest x-ray shows increased interstitial markings, question pneumonitis.  BNP 170.  White blood cell count 19.6.  As outlined above there may be a component of interstitial lung disease.  I will arrange for a pulmonary consult at time of admission.  Patient may also need right and left  cardiac catheterization to more definitively assess etiology of pulmonary infiltrates and dyspnea. Would also check high resolution CT scan. Olga Millers, MD

## 2017-12-28 NOTE — ED Notes (Signed)
Pt got up to use bedside commode, became SOB.  Urine not measured as there was toilet tissue in it.  Normal amount of urine in bedpan.

## 2017-12-28 NOTE — ED Notes (Signed)
Lab notified of new orders. 

## 2017-12-28 NOTE — ED Provider Notes (Signed)
[facility] EMERGENCY DEPARTMENT Provider Note   CSN: [id number] Arrival date & time: 12/28/17  1048     History   Chief Complaint Chief Complaint  Patient presents with  . Shortness of Breath    HPI Ely Spragg is a 62 y.o. female.  Patient sent down from Dr. Ludwig Clarks office.  Patient has a history of hypertrophic obstructive cardiomyopathy.  Normally suffers from CHF.  Patient's been having increasing shortness of breath and it got to the point where she felt she could not wait any longer.  Patient not on oxygen at home.  Dr. Jens Som saw her oxygen sats were 81% on room air.  Appropriately referred down here.  Upon arrival here patient was satting 88% on room air respiratory rate was up.  No hypotension.  Temperature was 99.  Patient denied any feeling as if she was getting sick with an upper respiratory infection or flulike illness.  Just stated that she has been short of breath.  Patient was admitted in January for similar symptoms that were related to CHF.     Past Medical History:  Diagnosis Date  . Diastolic heart failure (HCC)   . Environmental and seasonal allergies   . High cholesterol   . Hypertension   . Hypertensive heart disease 05/13/2017    Patient Active Problem List   Diagnosis Date Noted  . HOCM (hypertrophic obstructive cardiomyopathy) (HCC)   . Interstitial lung disease (HCC)   . Anemia   . Hypoxia 10/07/2017  . Hypotension 10/07/2017  . (HFpEF) heart failure with preserved ejection fraction (HCC) 10/07/2017  . Acute on chronic respiratory failure (HCC) 10/07/2017  . Chest pain 10/07/2017  . Acute on chronic diastolic congestive heart failure (HCC) 10/03/2017  . CAP (community acquired pneumonia) 05/22/2017  . SOB (shortness of breath)   . Acute respiratory failure with hypoxia (HCC) 05/13/2017  . Chest pain on exertion 05/13/2017  . Essential hypertension 05/13/2017  . Hyperlipidemia 05/13/2017  . Hyperglycemia 05/13/2017  .  Heart murmur 05/13/2017  . Hypertensive heart disease 05/13/2017  . Acute diastolic heart failure (HCC)   . LVH (left ventricular hypertrophy)   . Respiratory distress 05/12/2017    Past Surgical History:  Procedure Laterality Date  . ABDOMINAL HYSTERECTOMY    . SHOULDER ARTHROSCOPY Right      OB History   None      Home Medications    Prior to Admission medications   Medication Sig Start Date End Date Taking? Authorizing Provider  acetaminophen (TYLENOL) 500 MG tablet Take 1,000 mg by mouth every 6 (six) hours as needed for mild pain, moderate pain or headache.    [provider]  aspirin EC 81 MG EC tablet Take 1 tablet (81 mg total) by mouth daily. 05/14/17   Elgergawy, Leana Roe, MD  atorvastatin (LIPITOR) 20 MG tablet Take 20 mg by mouth daily after breakfast.     [provider]  azelastine (ASTELIN) 0.1 % nasal spray Place 1 spray into both nostrils 2 (two) times daily.    [provider]  fluticasone (FLONASE) 50 MCG/ACT nasal spray Place 1 spray into both nostrils daily.    [provider]  furosemide (LASIX) 20 MG tablet Take 20 mg by mouth 2 (two) times daily.    [provider]  metoprolol succinate (TOPROL-XL) 50 MG 24 hr tablet Take 150 mg by mouth daily. Take with or immediately following a meal.    [provider]  Multiple Vitamin (MULTIVITAMIN WITH MINERALS)  TABS tablet Take 1 tablet by mouth daily.    [provider]  OVER THE COUNTER MEDICATION Place 3 drops under the tongue daily. Allergy drops made up at Dr office    [provider]  potassium chloride (K-DUR) 10 MEQ tablet Take 1 tablet (10 mEq total) by mouth daily. Take along with lasix 10/04/17   Meredeth Ide, MD  VOLTAREN 1 % GEL Apply 1 application topically daily as needed (hand pain).  07/26/17   [provider]    Family History Family History  Problem Relation Age of Onset  . Dementia Mother   . Asthma Maternal  Grandmother   . Heart failure Maternal Uncle     Social History Social History   Tobacco Use  . Smoking status: Never Smoker  . Smokeless tobacco: Never Used  Substance Use Topics  . Alcohol use: Yes    Alcohol/week: 2.4 - 3.0 oz    Types: 4 - 5 Glasses of wine per week  . Drug use: No     Allergies   Iodinated diagnostic agents; Peanut oil; Metrizamide; and Penicillins   Review of Systems Review of Systems  HENT: Negative for congestion.   Eyes: Negative for visual disturbance.  Respiratory: Positive for shortness of breath.   Cardiovascular: Negative for chest pain and leg swelling.  Gastrointestinal: Negative for abdominal pain, nausea and vomiting.  Genitourinary: Negative for dysuria.  Musculoskeletal: Negative for back pain.  Neurological: Negative for headaches.  Hematological: Does not bruise/bleed easily.  Psychiatric/Behavioral: Negative for confusion.     Physical Exam Updated Vital Signs BP 102/71   Pulse 85   Temp 99.4 F (37.4 C) (Oral)   Resp (!) 31   SpO2 96%   Physical Exam  Constitutional: She is oriented to person, place, and time. She appears well-developed and well-nourished. She appears distressed.  HENT:  Head: Normocephalic and atraumatic.  Right Ear: External ear normal.  Left Ear: External ear normal.  Mouth/Throat: Oropharynx is clear and moist.  Eyes: Pupils are equal, round, and reactive to light. Conjunctivae and EOM are normal.  Neck: Neck supple.  Cardiovascular: Normal rate, regular rhythm and normal heart sounds.  Pulmonary/Chest: No stridor. She is in respiratory distress. She has no wheezes. She has no rales.  Abdominal: Soft. Bowel sounds are normal. There is no tenderness.  Musculoskeletal: Normal range of motion. She exhibits no edema.  Neurological: She is alert and oriented to person, place, and time. No cranial nerve deficit or sensory deficit. She exhibits normal muscle tone. Coordination normal.  Skin: Skin is  warm.  Nursing note and vitals reviewed.    ED Treatments / Results  Labs (all labs ordered are listed, but only abnormal results are displayed) Labs Reviewed  BASIC METABOLIC PANEL - Abnormal; Notable for the following components:      Result Value   Sodium 133 (*)    CO2 21 (*)    Glucose, Bld 117 (*)    All other components within normal limits  CBC - Abnormal; Notable for the following components:   WBC 19.6 (*)    Platelets 411 (*)    All other components within normal limits  HEPATIC FUNCTION PANEL - Abnormal; Notable for the following components:   Total Protein 8.4 (*)    All other components within normal limits  BRAIN NATRIURETIC PEPTIDE - Abnormal; Notable for the following components:   B Natriuretic Peptide 170.7 (*)    All other components within normal limits  TROPONIN  I  DIFFERENTIAL    EKG EKG Interpretation  Date/Time:  Wednesday December 28 2017 11:03:22 EDT Ventricular Rate:  90 PR Interval:    QRS Duration: 94 QT Interval:  366 QTC Calculation: 448 R Axis:   55 Text Interpretation:  Sinus rhythm Abnormal R-wave progression, early transition Probable LVH with secondary repol abnrm No significant change since last tracing Confirmed by Vanetta Mulders [phone]) on 12/28/2017 11:06:35 AM   Radiology Dg Chest 2 View  Result Date: 12/28/2017 CLINICAL DATA:  7-8 days of shortness of breath with wheezing and cough. History of CHF, interstitial lung disease, nonsmoker. EXAM: CHEST - 2 VIEW COMPARISON:  Chest x-ray of November 25, 2017 FINDINGS: The lungs are adequately inflated. The interstitial markings are more prominent diffusely today. There is no alveolar infiltrate. There is no pleural effusion. The heart and pulmonary vascularity are normal. The trachea is midline. The bony thorax exhibits no acute abnormality. IMPRESSION: Increased interstitial markings bilaterally without evidence of pulmonary vascular congestion or cardiomegaly. No alveolar pneumonia. The  findings may reflect an acute pneumonitis type process. Electronically Signed   By: David  [place] M.D.   On: 12/28/2017 11:18    Procedures Procedures (including critical care time)  CRITICAL CARE Performed by: Vanetta Mulders Total critical care time: 30 minutes Critical care time was exclusive of separately billable procedures and treating other patients. Critical care was necessary to treat or prevent imminent or life-threatening deterioration. Critical care was time spent personally by me on the following activities: development of treatment plan with patient and/or surrogate as well as nursing, discussions with consultants, evaluation of patient's response to treatment, examination of patient, obtaining history from patient or surrogate, ordering and performing treatments and interventions, ordering and review of laboratory studies, ordering and review of radiographic studies, pulse oximetry and re-evaluation of patient's condition.   Medications Ordered in ED Medications - No data to display   Initial Impression / Assessment and Plan / ED Course  I have reviewed the triage vital signs and the nursing notes.  Pertinent labs & imaging results that were available during my care of the patient were reviewed by me and considered in my medical decision making (see chart for details).    Patient with a history of hypertrophic obstructive cardiomyopathy.  Sent down here from Dr. Ludwig Clarks office.  Patient with increasing shortness of breath.  They assumed appropriately that she probably was in congestive heart failure which led to her admission to beginning of January.  Following that they were trying medical management and that that was not going to work they were thinking in terms that she was going to require cardiothoracic repair of the condition along with some valve abnormalities.  And this was going to be done at [facility].  Patient was having oxygen saturations of 81% and Dr. Ludwig Clarks  office at rest.  When she got down here in the gurney she was satting on room air 88%.  Patient does not have oxygen at home.  No lower extremity edema.  No real chest pain.  Here chest x-ray really showed sort of a bilateral pneumonitis but no CHF.  Troponin normal.  Labs significant for the fact that her white blood cell count was 19,000.  Troponin normal kidney function normal BNP was 170 kind of where she was when she was admitted in January.  But not very high in reality.  Plan was to have her admitted by internal medicine.  I spoke with Verdis Prime cardiology at [facility] he had not  been in communication with Dr. Jens Somrenshaw just to make sure that Dr. Jens Somrenshaw was not planning to have cardiology admit in conversation with Dr. Katrinka BlazingSmith and myself we decided best to go medicine admission and they would consult.  Internal medicine at [facility] called Dr. Ophelia CharterYates was concerned about the notes that Dr. Jens Somrenshaw was planning to admit but they were a good almost 2 hours prior to the time he is gone for the day no orders were ever done.  Ideally we want to do CT of the chest to further evaluate some of the lung findings but patient has a severe allergy to CT dye so doing a CT angios was not not appropriate.  So regular CT chest without was done just to get some further information.  Patient will require admission for hypoxia probably consultation with pulmonary medicine may need a VQ scan to rule out PE also cardiology will consult.  Patient very stable and very comfortable on 5 L of oxygen oxygen sats are up in the 98% range.  But her overall picture probably mean she would be a stepdown admission but but the hospitalist thought that perhaps she could be a MedSurg admission depending on what the CT scan showed.  Hospitalist will be reconsulted for the admission.  Patient remains very stable.     Final Clinical Impressions(s) / ED Diagnoses   Final diagnoses:  SOB (shortness of breath)  HOCM (hypertrophic  obstructive cardiomyopathy) (HCC)  Leukocytosis, unspecified type  Hypoxia    ED Discharge Orders    None       Vanetta MuldersZackowski, Ghassan Coggeshall, MD 12/28/17 1620

## 2017-12-28 NOTE — Consult Note (Addendum)
PULMONARY / CRITICAL CARE MEDICINE   Name: Hannah Colon MRN: [id number] DOB: 11/15/1955    ADMISSION DATE:  12/28/2017 CONSULTATION DATE:  12/28/17  REFERRING MD:  Lorin Mercy - [facility]  CHIEF COMPLAINT:  SOB  HISTORY OF PRESENT ILLNESS:   Hannah Colon is a 62 y.o. F with PMH as outlined below including dCHF and HOCM.  She is scheduled to be seen at [facility] (scheduled to see Dr. Mina Marble end of April 2019) and is also followed by Dr. Stanford Breed as outpatient in [place].  In the past, he did not tolerate vigorous diuresis presumed due to decreased preload exacerbating LVOT gradient.  She saw Dr. Stanford Breed 3/27 for worsening dyspnea.  Symptoms had worsened despite increased lasix and toprol (though BP did not tolerate increase in toprol).  In the office, she was found to have SpO2 of 81%.  Given her hypoxia, decision was made to admit her for further evaluation.  Cardiology is attempting to contact Dr. Mina Marble at [facility] to inquire if she could be transferred there now versus wait until appointment in April. CXR was obtained and demonstrated increased interstitial markings with question of pneumonitis.  In the past, there was question of ILD; therefore, PCCM was called again in consultation for further recs.  She arrived to [facility] early AM hours 3/28.  Overnight at [facility], she received 7m lasix after she was found to have bilateral crackles.  Pt reports this did in fact help her breathing and this morning, she feels much more comfortable and breathing much easier. She does endorse cough for past 8 days, dry and non-productive.  Denies any chest pain, fevers/chills/sweats, myalgias.  No exposures to known sick contacts.  No recent travel.  CT scan from January 2019 showed diffuse ground glass opacities.  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 Walking desaturation test on 11/10/2017 185 feet x 3 laps on  ROOM AIR:  did not desaturate. Rest pulse ox was 99%, final pulse ox was 91%. HR response 76/min at rest to 84/min at peak exertion.  Plans were to follow up as outpatient in 2 months and at some point, assess HRCT of the chest both supine and prone.   PAST MEDICAL HISTORY :  She  has a past medical history of Diastolic heart failure ([place], Environmental and seasonal allergies, High cholesterol, Hypertension, and Hypertensive heart disease (05/13/2017).  PAST SURGICAL HISTORY: She  has a past surgical history that includes Abdominal hysterectomy and Shoulder arthroscopy (Right).  Allergies  Allergen Reactions  . Iodinated Diagnostic Agents Anaphylaxis    hypotension  . Peanut Oil Swelling  . Metrizamide Other (See Comments)    Hypotension - iodinated contrast agents  . Penicillins Hives and Rash    Has patient had a PCN reaction causing immediate rash, facial/tongue/throat swelling, SOB or lightheadedness with hypotension: No Has patient had a PCN reaction causing severe rash involving mucus membranes or skin necrosis: No Has patient had a PCN reaction that required hospitalization: No Has patient had a PCN reaction occurring within the last 10 years: Yes If all of the above answers are "NO", then may proceed with Cephalosporin use. Tolerates Cefepime 10/2017.     No current facility-administered medications on file prior to encounter.    Current Outpatient Medications on File Prior to Encounter  Medication Sig  . acetaminophen (TYLENOL) 500 MG tablet Take 1,000 mg by mouth every 6 (six) hours as needed for mild pain,  moderate pain or headache.  Marland Kitchen aspirin EC 81 MG EC tablet Take 1 tablet (81 mg total) by mouth daily.  Marland Kitchen atorvastatin (LIPITOR) 20 MG tablet Take 20 mg by mouth daily after breakfast.   . azelastine (ASTELIN) 0.1 % nasal spray Place 1 spray into both nostrils 2 (two) times daily.  . calcium carbonate (TUMS - DOSED IN MG ELEMENTAL CALCIUM) 500 MG chewable tablet Chew 1  tablet by mouth 2 (two) times daily as needed for indigestion or heartburn.  . fluticasone (FLONASE) 50 MCG/ACT nasal spray Place 1 spray into both nostrils daily.  . furosemide (LASIX) 20 MG tablet Take 20 mg by mouth 2 (two) times daily.  . metoprolol succinate (TOPROL-XL) 50 MG 24 hr tablet Take 150 mg by mouth daily. Take with or immediately following a meal.  . Multiple Vitamin (MULTIVITAMIN WITH MINERALS) TABS tablet Take 1 tablet by mouth daily.  Marland Kitchen OVER THE COUNTER MEDICATION Place 3 drops under the tongue daily. Allergy drops made up at Dr office  . potassium chloride (K-DUR) 10 MEQ tablet Take 1 tablet (10 mEq total) by mouth daily. Take along with lasix  . VOLTAREN 1 % GEL Apply 1 application topically daily as needed (hand pain).     FAMILY HISTORY:  Her indicated that her mother is alive. She indicated that her father is deceased. She indicated that her maternal grandmother is deceased. She indicated that her maternal uncle is deceased.   SOCIAL HISTORY: She  reports that she has never smoked. She has never used smokeless tobacco. She reports that she drinks about 2.4 - 3.0 oz of alcohol per week. She reports that she does not use drugs.  REVIEW OF SYSTEMS:   All negative; except for those that are bolded, which indicate positives.  Constitutional: weight loss, weight gain, night sweats, fevers, chills, fatigue, weakness.  HEENT: headaches, sore throat, sneezing, nasal congestion, post nasal drip, difficulty swallowing, tooth/dental problems, visual complaints, visual changes, ear aches. Neuro: difficulty with speech, weakness, numbness, ataxia. CV:  chest pain, orthopnea, PND, swelling in lower extremities, dizziness, palpitations, syncope.  Resp: cough, hemoptysis, dyspnea, wheezing. GI: heartburn, indigestion, abdominal pain, nausea, vomiting, diarrhea, constipation, change in bowel habits, loss of appetite, hematemesis, melena, hematochezia.  GU: dysuria, change in color of  urine, urgency or frequency, flank pain, hematuria. MSK: joint pain or swelling, decreased range of motion. Psych: change in mood or affect, depression, anxiety, suicidal ideations, homicidal ideations. Skin: rash, itching, bruising.   SUBJECTIVE:  Breathing much improved this AM.     VITAL SIGNS: BP (!) 120/58 (BP Location: Right Arm)   Pulse 86   Temp 99.4 F (37.4 C) (Oral)   Resp 18   SpO2 97%   HEMODYNAMICS:    VENTILATOR SETTINGS:    INTAKE / OUTPUT: No intake/output data recorded.  PHYSICAL EXAMINATION: General: Adult female, resting in bed, in NAD. Neuro: A&O x 3, non-focal.  HEENT: [place]/AT. EOMI, sclerae anicteric. Cardiovascular: RRR, 4/6 SEM.  Lungs: Respirations even and unlabored.  Very faint basilar crackles. Abdomen: BS x 4, soft, NT/ND.  Musculoskeletal: No gross deformities, no edema.  Skin: Intact, warm, no rashes.   LABS:  BMET Recent Labs  Lab 12/28/17 1123  NA 133*  K 3.9  CL 101  CO2 21*  BUN 12  CREATININE 0.75  GLUCOSE 117*    Electrolytes Recent Labs  Lab 12/28/17 1123  CALCIUM 9.9    CBC Recent Labs  Lab 12/28/17 1123  WBC 19.6*  HGB 13.2  HCT 39.5  PLT 411*    Coag's No results for input(s): APTT, INR in the last 168 hours.  Sepsis Markers No results for input(s): LATICACIDVEN, PROCALCITON, O2SATVEN in the last 168 hours.  ABG No results for input(s): PHART, PCO2ART, PO2ART in the last 168 hours.  Liver Enzymes Recent Labs  Lab 12/28/17 1123  AST 39  ALT 14  ALKPHOS 86  BILITOT 0.7  ALBUMIN 3.9    Cardiac Enzymes Recent Labs  Lab 12/28/17 1123  TROPONINI <0.03    Glucose No results for input(s): GLUCAP in the last 168 hours.  Imaging Dg Chest 2 View  Result Date: 12/28/2017 CLINICAL DATA:  7-8 days of shortness of breath with wheezing and cough. History of CHF, interstitial lung disease, nonsmoker. EXAM: CHEST - 2 VIEW COMPARISON:  Chest x-ray of November 25, 2017 FINDINGS: The lungs are  adequately inflated. The interstitial markings are more prominent diffusely today. There is no alveolar infiltrate. There is no pleural effusion. The heart and pulmonary vascularity are normal. The trachea is midline. The bony thorax exhibits no acute abnormality. IMPRESSION: Increased interstitial markings bilaterally without evidence of pulmonary vascular congestion or cardiomegaly. No alveolar pneumonia. The findings may reflect an acute pneumonitis type process. Electronically Signed   By: David  Martinique M.D.   On: 12/28/2017 11:18     STUDIES:  CXR 3/27 > increased interstitial markings. CT chest 3/27 > diffuse bilateral airspace infiltrates.  CULTURES: Blood 3/27 >  ANTIBIOTICS: Levaquin 3/27 >   SIGNIFICANT EVENTS: 3/27 > admit.  LINES/TUBES: None.  DISCUSSION: 62 y.o. F with PMH including dCHF and HOCM (followed by Dr. Stanford Breed and scheduled to see Dr. Mina Marble at [facility] end of April).  Was thought to have component of ILD in the past as CT scan from January 2019 showed diffuse ground glass opacities.  Seen by Dr. 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  Plans were to obtain HRCT supine and prone in the near future. Now admitted 3/27 with dyspnea again.  Felt to be due to CHF / HOCM; however, non-contrast CT chest again showed diffuse bilateral airspace infiltrates; therefore, PCCM asked to see in consultation.   ASSESSMENT / PLAN:  Acute hypoxic respiratory failure - presumed due to CHF exacerbation in setting of HOCM. 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  Could she have some unknown fungus / mold exposure at home?  Though one would think we would see a recurring pattern / theme if this were the case).  HRCT was being considered at some point, has not yet been completed. Plan: Continue supplemental O2 as needed to  maintain SpO2 > 92%. Diuresis per cardiology team (she is hypersensitive to diuresis due to her HOCM), goal net negative balance. Assess HRCT supine and prone tomorrow after further diuresis (compare to CT from 3/27). Will hold off on further autoimmune workup given negative workup just 2 months ago. F/u as outpatient, might need biopsy at some point if this persists despite optimization of her HOCM / CHF treatment regimen. Further recs after HRCT has resulted and clinical status after diuresis, can consider bronch with BAL, biopsy etc.  AoC dCHF with HOCM. Plan:  Cardiology following / managing. Cardiology contacting Dr. Mina Marble at [facility] to consider transfer there.   Rest per primary team.   FAMILY  - Updates: No family at bedside.  - Inter-disciplinary family  meet or Palliative Care meeting due by:  01/04/18.   Montey Hora, [place] [facility] Pager: [phone]  or [phone] 12/29/2017, 8:03 AM

## 2017-12-28 NOTE — ED Notes (Signed)
Pt c/o of increased frequency of dry cough over the last hour. EDP made aware of assessment findings.

## 2017-12-28 NOTE — Progress Notes (Addendum)
Called from Dr. Rogene Houston regarding this patient.  Dr. Tamala Julian (cardiology) is aware.  She has HOCM with h/o CHF.  Admitted in January with exacerbation, worsening.  She is being followed by [facility] for this.  She saw Dr. Stanford Breed this AM for progressive SOB.  +Hypoxia to 80s.  On 5L O2 now.  CXR negative for edema, ?bilateral pneumonitis.  BNP 170.  Troponin negative.  EKG unremarkable.  WBC 19k.  Has h/o similar situation, better with abx in the past but uncertain what diagnosis is.  No fever.  Plan was for transfer to [facility] with pulm consult.  ESR pending.  CTA pending.  Will await CTA report to determine where to place the patient.  Also, Dr. Jacalyn Lefevre note appears to indicate that cardiology will admit the patient or transfer to [facility].  Update - Pulmonary also consulted on the patient and appears to think this is due to CHF.  CTA unable to be performed due to anaphylactic contrast allergy.  Dr. Billy Fischer has assumed care and recommends admission; IV antibiotics; and diuresis.  Dr. Rogene Houston had recommended SDU.  Will place in SDU for now despite generally normal-appearing vital signs.    Carlyon Shadow, M.D

## 2017-12-28 NOTE — ED Notes (Signed)
Patient transported to CT 

## 2017-12-28 NOTE — ED Notes (Signed)
ED Provider at bedside. 

## 2017-12-28 NOTE — ED Triage Notes (Addendum)
Patient sent from PCP for shortness of breath.  Patient reports ongoing cough x 7days.  Additionally reports chest pain.  Speaking in full sentences with some difficulty.  Patient taken to room 14.

## 2017-12-29 ENCOUNTER — Encounter (HOSPITAL_COMMUNITY): Payer: Self-pay | Admitting: Physician Assistant

## 2017-12-29 ENCOUNTER — Inpatient Hospital Stay (HOSPITAL_COMMUNITY): Payer: 59

## 2017-12-29 DIAGNOSIS — I422 Other hypertrophic cardiomyopathy: Secondary | ICD-10-CM

## 2017-12-29 LAB — BASIC METABOLIC PANEL
Anion gap: 10 (ref 5–15)
BUN: 13 mg/dL (ref 6–20)
CO2: 24 mmol/L (ref 22–32)
Calcium: 9.2 mg/dL (ref 8.9–10.3)
Chloride: 102 mmol/L (ref 101–111)
Creatinine, Ser: 0.66 mg/dL (ref 0.44–1.00)
GFR calc Af Amer: >60 mL/min (ref >60)
GFR calc non Af Amer: >60 mL/min (ref >60)
GLUCOSE: 104 mg/dL — AB (ref 65–99)
POTASSIUM: 3.7 mmol/L (ref 3.5–5.1)
SODIUM: 136 mmol/L (ref 135–145)

## 2017-12-29 LAB — TROPONIN I: Troponin I: <0.03 ng/mL (ref <0.03)

## 2017-12-29 LAB — RESPIRATORY PANEL BY PCR
ADENOVIRUS-RVPPCR: NOT DETECTED
Bordetella pertussis: NOT DETECTED
CHLAMYDOPHILA PNEUMONIAE-RVPPCR: NOT DETECTED
CORONAVIRUS HKU1-RVPPCR: NOT DETECTED
CORONAVIRUS NL63-RVPPCR: NOT DETECTED
Coronavirus 229E: NOT DETECTED
Coronavirus OC43: NOT DETECTED
INFLUENZA A-RVPPCR: NOT DETECTED
Influenza B: NOT DETECTED
Metapneumovirus: NOT DETECTED
Mycoplasma pneumoniae: NOT DETECTED
PARAINFLUENZA VIRUS 4-RVPPCR: NOT DETECTED
Parainfluenza Virus 1: NOT DETECTED
Parainfluenza Virus 2: NOT DETECTED
Parainfluenza Virus 3: NOT DETECTED
Respiratory Syncytial Virus: NOT DETECTED
Rhinovirus / Enterovirus: NOT DETECTED

## 2017-12-29 LAB — ECHOCARDIOGRAM COMPLETE
Height: 60 in
Weight: 1968 oz

## 2017-12-29 LAB — BRAIN NATRIURETIC PEPTIDE: B NATRIURETIC PEPTIDE 5: 77.6 pg/mL (ref 0.0–100.0)

## 2017-12-29 LAB — MRSA PCR SCREENING: MRSA BY PCR: NEGATIVE

## 2017-12-29 MED ORDER — FUROSEMIDE 10 MG/ML IJ SOLN
20.0000 mg | Freq: Once | INTRAMUSCULAR | Status: AC
Start: 2017-12-29 — End: 2017-12-29
  Administered 2017-12-29: 20 mg via INTRAVENOUS
  Filled 2017-12-29: qty 2

## 2017-12-29 MED ORDER — BENZONATATE 100 MG PO CAPS
100.0000 mg | ORAL_CAPSULE | Freq: Once | ORAL | Status: AC
  Administered 2017-12-29: 100 mg via ORAL
  Filled 2017-12-29: qty 1

## 2017-12-29 MED ORDER — SODIUM CHLORIDE 0.9 % IV SOLN: 250.0000 mL | INTRAVENOUS | Status: DC | PRN

## 2017-12-29 MED ORDER — LIVING BETTER WITH HEART FAILURE BOOK
Freq: Once | Status: AC
  Administered 2017-12-29: 18:00:00

## 2017-12-29 MED ORDER — ASPIRIN 81 MG PO CHEW
81.0000 mg | CHEWABLE_TABLET | ORAL | Status: AC
  Administered 2017-12-30: 81 mg via ORAL

## 2017-12-29 MED ORDER — SODIUM CHLORIDE 0.9% FLUSH
3.0000 mL | Freq: Two times a day (BID) | INTRAVENOUS | Status: DC
  Administered 2017-12-29: 3 mL via INTRAVENOUS

## 2017-12-29 MED ORDER — ACETAMINOPHEN 325 MG PO TABS
650.0000 mg | ORAL_TABLET | ORAL | Status: DC | PRN
  Administered 2018-01-02 – 2018-01-03 (×4): 650 mg via ORAL
  Filled 2017-12-29 (×4): qty 2

## 2017-12-29 MED ORDER — ONDANSETRON HCL 4 MG/2ML IJ SOLN: 4.0000 mg | Freq: Four times a day (QID) | INTRAMUSCULAR | Status: DC | PRN

## 2017-12-29 MED ORDER — METOPROLOL SUCCINATE ER 100 MG PO TB24
100.0000 mg | ORAL_TABLET | Freq: Every day | ORAL | Status: DC
  Administered 2017-12-30 – 2018-01-04 (×6): 100 mg via ORAL
  Filled 2017-12-29: qty 1
  Filled 2017-12-29: qty 2
  Filled 2017-12-29: qty 1
  Filled 2017-12-29 (×3): qty 2

## 2017-12-29 MED ORDER — SODIUM CHLORIDE 0.9 % IV SOLN: INTRAVENOUS | Status: DC

## 2017-12-29 MED ORDER — BENZONATATE 100 MG PO CAPS
100.0000 mg | ORAL_CAPSULE | Freq: Three times a day (TID) | ORAL | Status: DC | PRN
  Administered 2017-12-30 – 2017-12-31 (×3): 100 mg via ORAL
  Filled 2017-12-29 (×3): qty 1

## 2017-12-29 MED ORDER — SODIUM CHLORIDE 0.9% FLUSH: 3.0000 mL | INTRAVENOUS | Status: DC | PRN

## 2017-12-29 MED ORDER — ASPIRIN 81 MG PO CHEW: 81.0000 mg | CHEWABLE_TABLET | ORAL | Status: DC

## 2017-12-29 MED ORDER — HEPARIN SODIUM (PORCINE) 5000 UNIT/ML IJ SOLN
5000.0000 [IU] | Freq: Three times a day (TID) | INTRAMUSCULAR | Status: DC
  Administered 2017-12-29: 5000 [IU] via SUBCUTANEOUS
  Filled 2017-12-29: qty 1

## 2017-12-29 MED ORDER — SODIUM CHLORIDE 0.9 % IV SOLN
INTRAVENOUS | Status: DC
  Administered 2017-12-30: 05:00:00 via INTRAVENOUS

## 2017-12-29 MED ORDER — HEPARIN SODIUM (PORCINE) 5000 UNIT/ML IJ SOLN
5000.0000 [IU] | Freq: Three times a day (TID) | INTRAMUSCULAR | Status: AC
  Administered 2017-12-29 – 2018-01-01 (×10): 5000 [IU] via SUBCUTANEOUS
  Filled 2017-12-29 (×10): qty 1

## 2017-12-29 NOTE — ED Notes (Signed)
Attempted to give report, RN unable to get report at this time and will call back. [facility].

## 2017-12-29 NOTE — ED Notes (Signed)
[facility] [id number].

## 2017-12-29 NOTE — H&P (Signed)
History and Physical    Hannah Colon DJM:[id number] DOB: 1955-12-06 DOA: 12/28/2017  PCP: Elisabeth Cara, PA-C Consultants:  Stanford Breed - cardiology; [facility] - pulmonology; Jodelle Gross - orthopedics Patient coming from:  Home - lives with husband and 61yo daughhter; NOK: husband, [phone]  Chief Complaint: SOB  HPI: Hannah Colon is a 62 y.o. female with medical history significant of HTN, HLD, diastolic heart failure presenting with Patient was hospitalized 12/31-1/2 and 1/4-10.  She had developed a cough, "pretty much the same thing it is now."  Dr. Stanford Breed has been her heart doctor since then.  She had developed a murmur and leaky heart valve discovered in August.  In January, it was "full blown obstructive hypertrophic cardiomyopathy.  And there's a growth, I think it's the left side that puts the blood back out into the system."  They have been using Lasix and Metoprolol.  She has been on escalating doses of medication.  Changed to 200 mg Toprol XL qhs with Lasix still on 20 mg as of 3/7.  Has good days and bad days.  She had a couple of days "where I actually thought I could see the light."  Then a few days later she started coughing again, feeling worse, and "it's been downhill ever since".  Low oxygen, low BP this week.  O2 level in the 80s.  Monday, changed medications again and increased Lasix.  Sleeping propped up for 5 days.  Coughing all the time.  SOB just going from recliner to the bathroom.  O2 level 81% in Dr. Jacalyn Lefevre office yesterday.  She is not on home O2 but she feels tremendously better on the O2 and has normal sats now.  Cough is much better while on the oxygen too.  She has remained on 5L O2 - when they turned her down to 3L, her O2 sats dropped and she started coughing again.  Cough is nonproductive.  No fevers - but she has had 3 episodes of freezing cold at times and it will take a couple of hours for her to get warm.  +PND.  No edema "ever."  No sick contacts  - other than husband with stomach virus 2 weeks ago.  She is on a low sodium and low fluid diet and is down 15 pounds since January.   ED Course: +Hypoxia to 72s.  On 5L O2 now.  CXR negative for edema, ?bilateral pneumonitis.  BNP 170.  Troponin negative.  EKG unremarkable.  WBC 19k.  Has h/o similar situation, better with abx in the past but uncertain what diagnosis is.  No fever.  Cardiology on board for CHF exacerbation.  Pulmonary also consulted on the patient and appears to think this is due to CHF.  CTA unable to be performed due to anaphylactic contrast allergy.  [facility] doc recommends admission; IV antibiotics; and diuresis.     Review of Systems: As per HPI; otherwise review of systems reviewed and negative.   Ambulatory Status:  Ambulates without assistance  Past Medical History:  Diagnosis Date  . Abnormal pulmonary finding    a. has been considered to have possible ILD.  Marland Kitchen Anemia of chronic disease   . Chronic diastolic CHF (congestive heart failure) ([place])   . Environmental and seasonal allergies   . High cholesterol   . Hypertension   . Hypertensive heart disease 05/13/2017    Past Surgical History:  Procedure Laterality Date  . ABDOMINAL HYSTERECTOMY    . FINGER SURGERY Right 11/22/2017  . SHOULDER ARTHROSCOPY  Right     Social History   Socioeconomic History  . Marital status: Married    Spouse name: Not on file  . Number of children: Not on file  . Years of education: Not on file  . Highest education level: Not on file  Occupational History  . Occupation: Press photographer  Social Needs  . Financial resource strain: Not on file  . Food insecurity:    Worry: Not on file    Inability: Not on file  . Transportation needs:    Medical: Not on file    Non-medical: Not on file  Tobacco Use  . Smoking status: Never Smoker  . Smokeless tobacco: Never Used  Substance and Sexual Activity  . Alcohol use: Yes    Alcohol/week: 2.4 - 3.0 oz    Types: 4 - 5 Glasses of wine  per week    Comment: 1 total glass since 12/19  . Drug use: No  . Sexual activity: Not on file  Lifestyle  . Physical activity:    Days per week: Not on file    Minutes per session: Not on file  . Stress: Not on file  Relationships  . Social connections:    Talks on phone: Not on file    Gets together: Not on file    Attends religious service: Not on file    Active member of club or organization: Not on file    Attends meetings of clubs or organizations: Not on file    Relationship status: Not on file  . Intimate partner violence:    Fear of current or ex partner: Not on file    Emotionally abused: Not on file    Physically abused: Not on file    Forced sexual activity: Not on file  Other Topics Concern  . Not on file  Social History Narrative  . Not on file    Allergies  Allergen Reactions  . Iodinated Diagnostic Agents Anaphylaxis    hypotension  . Peanut Oil Swelling  . Metrizamide Other (See Comments)    Hypotension - iodinated contrast agents  . Penicillins Hives and Rash    Has patient had a PCN reaction causing immediate rash, facial/tongue/throat swelling, SOB or lightheadedness with hypotension: No Has patient had a PCN reaction causing severe rash involving mucus membranes or skin necrosis: No Has patient had a PCN reaction that required hospitalization: No Has patient had a PCN reaction occurring within the last 10 years: Yes If all of the above answers are "NO", then may proceed with Cephalosporin use. Tolerates Cefepime 10/2017.     Family History  Problem Relation Age of Onset  . Dementia Mother   . Asthma Maternal Grandmother   . Heart failure Maternal Uncle     Prior to Admission medications   Medication Sig Start Date End Date Taking? Authorizing Provider  acetaminophen (TYLENOL) 500 MG tablet Take 1,000 mg by mouth every 6 (six) hours as needed for mild pain, moderate pain or headache.   Yes [provider]  aspirin EC 81 MG EC tablet  Take 1 tablet (81 mg total) by mouth daily. 05/14/17  Yes Elgergawy, Silver Huguenin, MD  atorvastatin (LIPITOR) 20 MG tablet Take 20 mg by mouth daily after breakfast.    Yes [provider]  azelastine (ASTELIN) 0.1 % nasal spray Place 1 spray into both nostrils 2 (two) times daily.   Yes [provider]  calcium carbonate (TUMS - DOSED IN MG ELEMENTAL CALCIUM) 500 MG chewable  tablet Chew 1 tablet by mouth 2 (two) times daily as needed for indigestion or heartburn.   Yes [provider]  fluticasone (FLONASE) 50 MCG/ACT nasal spray Place 1 spray into both nostrils daily.   Yes [provider]  furosemide (LASIX) 20 MG tablet Take 20 mg by mouth 2 (two) times daily.   Yes [provider]  metoprolol succinate (TOPROL-XL) 50 MG 24 hr tablet Take 150 mg by mouth daily. Take with or immediately following a meal.   Yes [provider]  Multiple Vitamin (MULTIVITAMIN WITH MINERALS) TABS tablet Take 1 tablet by mouth daily.   Yes [provider]  OVER THE COUNTER MEDICATION Place 3 drops under the tongue daily. Allergy drops made up at Dr office   Yes [provider]  potassium chloride (K-DUR) 10 MEQ tablet Take 1 tablet (10 mEq total) by mouth daily. Take along with lasix 10/04/17  Yes Lama, Marge Duncans, MD  VOLTAREN 1 % GEL Apply 1 application topically daily as needed (hand pain).  07/26/17  Yes [provider]    Physical Exam: Vitals:   12/29/17 1100 12/29/17 1141 12/29/17 1200 12/29/17 1300  BP: 105/67  92/63 93/62  Pulse: (!) 101  89 92  Resp: 19  (!) 22 12  Temp:  98.4 F (36.9 C)    TempSrc:  Oral    SpO2: 95%  97% 97%  Weight:      Height:         General:  Appears calm and comfortable and is NAD Eyes:   EOMI, normal lids, iris ENT:  grossly normal hearing, lips & tongue, mmm; appropriate dentition Neck:  no LAD, masses or thyromegaly; no carotid bruits Cardiovascular:  RRR, no r/g, loud 6-3/0 systolic murmur  best heard at the tricuspid region. No LE edema.  Respiratory: Diffuse fine rhonchi predominantly in the bases.  Normal respiratory effort on 5L O2. Abdomen:  soft, NT, ND, NABS Back:   normal alignment, no CVAT Skin:  no rash or induration seen on limited exam Musculoskeletal:  grossly normal tone BUE/BLE, good ROM, no bony abnormality Lower extremity:  No LE edema.  Limited foot exam with no ulcerations.  2+ distal pulses. Psychiatric:  grossly normal mood and affect, speech fluent and appropriate, AOx3 Neurologic:  CN 2-12 grossly intact, moves all extremities in coordinated fashion, sensation intact    Radiological Exams on Admission: Dg Chest 2 View  Result Date: 12/28/2017 CLINICAL DATA:  7-8 days of shortness of breath with wheezing and cough. History of CHF, interstitial lung disease, nonsmoker. EXAM: CHEST - 2 VIEW COMPARISON:  Chest x-ray of November 25, 2017 FINDINGS: The lungs are adequately inflated. The interstitial markings are more prominent diffusely today. There is no alveolar infiltrate. There is no pleural effusion. The heart and pulmonary vascularity are normal. The trachea is midline. The bony thorax exhibits no acute abnormality. IMPRESSION: Increased interstitial markings bilaterally without evidence of pulmonary vascular congestion or cardiomegaly. No alveolar pneumonia. The findings may reflect an acute pneumonitis type process. Electronically Signed   By: David  Martinique M.D.   On: 12/28/2017 11:18   Ct Chest Wo Contrast  Result Date: 12/28/2017 CLINICAL DATA:  Shortness of breath and chronic cough for 8 days, abnormal chest radiograph EXAM: CT CHEST WITHOUT CONTRAST TECHNIQUE: Multidetector CT imaging of the chest was performed following the standard protocol without IV contrast. Sagittal and coronal MPR images reconstructed from axial data set. COMPARISON:  None; correlation chest radiograph 12/28/2017 FINDINGS: Cardiovascular:  Atherosclerotic calcifications of aorta and  coronary arteries. Aorta normal caliber. Heart normal size. No pericardial effusion. Mediastinum/Nodes: Esophagus normal appearance. Base of cervical region normal appearance. Multiple normal size mediastinal lymph nodes with a single enlarged precarinal lymph node 12 mm short axis. Lungs/Pleura: Diffuse BILATERAL airspace infiltrates are seen throughout both lungs with a slight upper lobe predominance. This could represent infection or edema. Minimal central peribronchial thickening. No pleural effusion or pneumothorax. Single calcified pulmonary granuloma LEFT lower lobe. No additional pulmonary mass/nodule. Upper Abdomen: Visualized upper abdomen unremarkable. Musculoskeletal: Chronic appearing compression deformity of T10 vertebral body. No acute osseous findings. IMPRESSION: Diffuse BILATERAL airspace infiltrates throughout both lungs with a slight upper lobe predominance, could represent pulmonary edema or infection. Single nonspecific mildly enlarged precarinal lymph node potentially reactive. Coronary artery atherosclerotic calcifications. Aortic Atherosclerosis (ICD10-I70.0). Electronically Signed   By: Lavonia Dana M.D.   On: 12/28/2017 16:29    EKG: Independently reviewed.  NSR with rate 90; LVH with repolarization abnormality  Labs on Admission: I have personally reviewed the available labs and imaging studies at the time of the admission.  Pertinent labs:   Glucose 104 Lactate 1.76 BNP 170.7, slightly increased from prior Troponin negative x 2 WBC 19.6 Platelets 411 ESR 44  Assessment/Plan Principal Problem:   Acute on chronic respiratory failure with hypoxia (HCC) Active Problems:   Essential hypertension   Hyperlipidemia   Hyperglycemia   Heart murmur   Acute on chronic diastolic congestive heart failure (HCC)   Acute on chronic respiratory failure  -Patient's shortness of breath and nonproductive cough are associated with orthopnea and PND, providing a history c/w  CHF. -She does not have a smoking history. -No h/o O2 dependence prior - but interestingly, her symptoms including cough improve with 5L [place] O2. -She does not have fever but does have leukocytosis.  -Chest CT shows diffuse bilateral airspace infiltrates in both lungs -will admit patient - with her failure of outpatient therapy and persistently decreased O2 sats, it seems likely that she will need several days of hospitalization to show sufficient improvement for discharge. -Pulmonary is on board in case this is a primary or coincident pulmonary process -High-resolution CT ordered -Consider bronchoscopy with BAL if not thought to be cardiac in origin -Consider steroids, although ESR is not overly concerning for an inflammatory process -Agree that this does not appear to need antibiotics at this time  CHF exacerbation -Patient without smoking history or prior h/o respiratory failure found in 1/19 to have severe LVH and grade 2 diastolic dysfunction thought to be related to hypertrophic obstructive cardiomyopathy -CXR yesterday with increased interstitial markings without clear CHF picture, but CT indicates that pulmonary edema is a consideration -Very mildly levated BNP on presentation yesterday -Her description of symptoms - including DOE, orthopnea, and PND - is certainly concerning for CHF -She does not have edema -Repeat echo today -Cardiology is following -She is not on an ACE, which may be beneficial -Continue Toprol at slightly lower than home dose -Given Lasix x 2 doses, will defer additional diuresis to cardiology -Plan for right heart cath -Continue [place] O2 for now -Normal kidney function at this time, will follow -Repeat EKG in AM  HTN -Continue Toprol at decreased dose for now -Consider addition of ACE  HLD -Continue Lipitor  Hyperglycemia -Normal A1c in 12/18 -Will follow with AM labs for now    DVT prophylaxis: Lovenox  Code Status:  Full - confirmed with  patient/husband Family Communication: None present until her husband  arrived at the completion of the visit Disposition Plan:  Home once clinically improved Consults called: Cardiology, Pulmonology  Admission status: Admit - It is my clinical opinion that admission to INPATIENT is reasonable and necessary because this patient will require at least 2 midnights in the hospital to treat this condition based on the medical complexity of the problems presented.  Given the aforementioned information, the predictability of an adverse outcome is felt to be significant.    Karmen Bongo MD [facility]  If note is complete, please contact covering daytime or nighttime physician. [email or web address] Password [email or web address]  12/29/2017, 1:30 PM

## 2017-12-29 NOTE — ED Provider Notes (Signed)
Received care of pt from Dr. Deretha EmoryZackowski. Please see his note for prior hx, physical and care.  Briefly this is a 62yo female with history of HOCM, CHF who presented with shortness of breath and was found to be hypoxic.   Has anaphylaxis to contrast, unable to obtain CTA.  CT chest shows CHF vs infection. Given leukocytosis, ordered blood cx and abx. Will give oral levofloxacin as I feel CHF is more likely than infection and would like to avoid excess fluids at this time. Lactic acid normal.  Crackles present on my exam and new per nursing staff.  Given 20mg  IV lasix. She continues to have some tachypnea and stepdown admission appropriate. Will admit for likely CHF r/o infection. Ordered home meds.     Alvira MondaySchlossman, Dontrel Smethers, MD 12/29/17 [phone]

## 2017-12-29 NOTE — Progress Notes (Signed)
  Echocardiogram 2D Echocardiogram has been performed.  Shikha Bibb L Androw 12/29/2017, 9:14 AM

## 2017-12-29 NOTE — Consult Note (Addendum)
Cardiology Consultation:   Patient ID: Hannah Colon; [id number]; Jun 02, 1956   Admit date: 12/28/2017 Date of Consult: 12/29/2017  Primary Care Provider: Elisabeth Cara, PA-C Primary Cardiologist: Dr. Stanford Breed  Chief Complaint: fatigue, shortness of breath, orthopnea, recurrent cough  Patient Profile:   Hannah Colon is a 62 y.o. 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 who is being seen today for the evaluation of SOB at the request of Dr. Lorin Mercy.  History of Present Illness:   She established care with our group in 05/2017 when admitted with chest pain, SOB, orthopnea and fatigue. D-dimer was negative. She underwent nuc which was normal. Echo at that time showed EF 65-70% with moderate LVH, grade 1 DD and mild outflow tract gradient. She was started on metoprolol and continued on HCTZ, but continued to have symptoms so HCTZ was switched to Lasix. In December 2018 she was seen back for recurrent symptoms and noted to have hypoxia and tachycardia. Meds were adjusted as OP but she ended up being admitted 10/2017 with persistent symptoms. Repeat echo 10/08/17 showed severe LVH, EF 65-70%, Intracavitary dynamic gradient peak of 2mHg due to  hyperdynamic LV and severe LVH with evidence of SAM of anterior MV leaflet, grade 2 DD, high ventricular filling pressure, mildly calcified AV annulus,  Mean gradient (S): 29 mm Hg. AVA not calculated, unable  to measure LVOT VTI or VMAX. Moderate gradient across AV, morphologically the valve looks normal. Most of gradient is likely subvalvular, eccentric posterior MR difficult to assess/looks to be caused by [facility] anterior leaflet with mild-mod MR, severely dilated LA, technically difficult study. CTA  was neg for PE at that time but showed persistent airspace disease of unclear etiology. She became hypotensive - initially felt possibly due to allergic reaction from CTA - but also became hypotensive with aggressive diuresis as well. She required Levophed and hydrocortisone IV. Workup was notable for negative HIV, CCP, anti-DNA AB DS, anti-scleroderma AB, GBM, ANCA, RF, ANA. A. 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 Aldolase was elevated along with ESR (60), CK and CKMB. While inpatient, pulm suggested she may need BAL/transbronchial biopsy after discharge. It was advised to avoid ARB in the future as it would exacerbate HOCM physiology. After discharge, pulm followed up CXR and was unimpressed and felt symptoms were more cardiac. She underwent ETT 11/02/17 which was nondiagnostic as she did not reach THR (attenuated due to medication), but had normal response to BP. 24-hour Holter 10/2017 showed NSR PACs/PVCs, no NSVT. Cardiac MR 11/04/17 showed findings c/w hypertrophic obstructive cardiomyopathy but no myocardial LGE noted. Toprol was recently decreased due to low blood pressure. She was seen in the office yesterday with worsening DOE, recurrent orthopnea and increased coughing. She was found to be hypoxic and admission was recommended. Dr. CStanford Breedstated could consider disopyramide if available and to consider eventual septal myomectomy, but was concerned that her overall cardiac workup thereafter was fairly unrevealing with totally normal BNP this AM. Dr. CStanford Breedsp[facility] the consensus was that she needs pulmonary evaluation first. She does have f/u at the end of April there.  Upon admission, CXR showed increased interstitial markings without evidence of pulm vascular congestion or cardiomegaly, may represent acute pneumonitis. CT chest showed diffuse BILATERAL  airspace infiltrates throughout both lungs with a slight upper lobe predominance, could  represent pulmonary edema or infection; single nonspecific mildly enlarged precarinal lymph node potentially reactive, coronary artery calcification, aortic atherosclerosis. Stat 2d echo is pending. Labs are notable for BNP of 77 this AM, neg troponin x 2, normal lactic acid, initial Na 133, leukocytosis of 19 (was normal 10/2017), ESRD 44. She received 9m IV Lasix yesterday as well as one dose of Levaquin and is currently feeling much better, but has been comfortable since placed on oxygen. No CP. The cough has ebbed and flowed over many months' time, always dry.  She's been gradually losing a pound a week or every few weeks. She previously weighed in the 135-137 range but is now 123. Some of this she attributes to having to stick to a lower sodium diet.   Past Medical History:  Diagnosis Date  . Abnormal pulmonary finding    a. has been considered to have possible ILD.  .Marland KitchenAnemia of chronic disease   . Chronic diastolic CHF (congestive heart failure) [place]   . Environmental and seasonal allergies   . High cholesterol   . Hypertension   . Hypertensive heart disease 05/13/2017    Past Surgical History:  Procedure Laterality Date  . ABDOMINAL HYSTERECTOMY    . SHOULDER ARTHROSCOPY Right      Inpatient Medications: Scheduled Meds: . aspirin  81 mg Oral Daily  . atorvastatin  20 mg Oral QPC breakfast  . azelastine  1 spray Each Nare BID  . fluticasone  1 spray Each Nare Daily  . heparin  5,000 Units Subcutaneous Q8H   Continuous Infusions:  PRN Meds: calcium carbonate  Home Meds: Prior to Admission medications   Medication Sig Start Date End Date Taking? Authorizing Provider  acetaminophen (TYLENOL) 500 MG tablet Take 1,000 mg by mouth every 6 (six) hours as needed for mild pain, moderate pain or headache.   Yes [provider]  aspirin EC 81 MG EC tablet Take 1 tablet (81 mg total) by mouth daily. 05/14/17  Yes Elgergawy, DSilver Huguenin MD  atorvastatin (LIPITOR) 20 MG tablet  Take 20 mg by mouth daily after breakfast.    Yes [provider]  azelastine (ASTELIN) 0.1 % nasal spray Place 1 spray into both nostrils 2 (two) times daily.   Yes [provider]  calcium carbonate (TUMS - DOSED IN MG ELEMENTAL CALCIUM) 500 MG chewable tablet Chew 1 tablet by mouth 2 (two) times daily as needed for indigestion or heartburn.   Yes [provider]  fluticasone (FLONASE) 50 MCG/ACT nasal spray Place 1 spray into both nostrils daily.   Yes [provider]  furosemide (LASIX) 20 MG tablet Take 20 mg by mouth 2 (two) times daily.   Yes [provider]  metoprolol succinate (TOPROL-XL) 50 MG 24 hr tablet Take 150 mg by mouth daily. Take with or immediately following a meal.   Yes [provider]  Multiple Vitamin (MULTIVITAMIN WITH MINERALS) TABS tablet Take 1 tablet by mouth daily.   Yes [provider]  OVER THE COUNTER MEDICATION Place 3 drops under the tongue daily. Allergy drops made up at Dr office   Yes [provider]  potassium chloride (K-DUR) 10 MEQ tablet Take 1 tablet (10 mEq total) by mouth daily. Take along with lasix 10/04/17  Yes Lama, GMarge Duncans MD  VOLTAREN 1 % GEL Apply 1 application topically daily as needed (hand pain).  07/26/17  Yes [provider]  Allergies:    Allergies  Allergen Reactions  . Iodinated Diagnostic Agents Anaphylaxis    hypotension  . Peanut Oil Swelling  . Metrizamide Other (See Comments)    Hypotension - iodinated contrast agents  . Penicillins Hives and Rash    Has patient had a PCN reaction causing immediate rash, facial/tongue/throat swelling, SOB or lightheadedness with hypotension: No Has patient had a PCN reaction causing severe rash involving mucus membranes or skin necrosis: No Has patient had a PCN reaction that required hospitalization: No Has patient had a PCN reaction occurring within the last 10 years: Yes If all of the above answers are "NO",  then may proceed with Cephalosporin use. Tolerates Cefepime 10/2017.     Social History:   Social History   Socioeconomic History  . Marital status: Married    Spouse name: Not on file  . Number of children: Not on file  . Years of education: Not on file  . Highest education level: Not on file  Occupational History  . Occupation: Press photographer  Social Needs  . Financial resource strain: Not on file  . Food insecurity:    Worry: Not on file    Inability: Not on file  . Transportation needs:    Medical: Not on file    Non-medical: Not on file  Tobacco Use  . Smoking status: Never Smoker  . Smokeless tobacco: Never Used  Substance and Sexual Activity  . Alcohol use: Yes    Alcohol/week: 2.4 - 3.0 oz    Types: 4 - 5 Glasses of wine per week  . Drug use: No  . Sexual activity: Not on file  Lifestyle  . Physical activity:    Days per week: Not on file    Minutes per session: Not on file  . Stress: Not on file  Relationships  . Social connections:    Talks on phone: Not on file    Gets together: Not on file    Attends religious service: Not on file    Active member of club or organization: Not on file    Attends meetings of clubs or organizations: Not on file    Relationship status: Not on file  . Intimate partner violence:    Fear of current or ex partner: Not on file    Emotionally abused: Not on file    Physically abused: Not on file    Forced sexual activity: Not on file  Other Topics Concern  . Not on file  Social History Narrative  . Not on file    Family History:   The patient's family history includes Asthma in her maternal grandmother; Dementia in her mother; Heart failure in her maternal uncle.  ROS:  Please see the history of present illness.  All other ROS reviewed and negative.     Physical Exam/Data:   Vitals:   12/29/17 0655 12/29/17 0700 12/29/17 0739 12/29/17 0800  BP: (!) 160/57 117/67  128/68  Pulse: 95 89  82  Resp: (!) 37 (!) 22  18    Temp:   98.6 F (37 C)   TempSrc:   Oral   SpO2: 96% 95%  95%  Weight:      Height:        Intake/Output Summary (Last 24 hours) at 12/29/2017 0841 Last data filed at 12/28/2017 2332 Gross per 24 hour  Intake 600 ml  Output 250 ml  Net 350 ml   Filed Weights   12/28/17 2330  Weight: 123 lb (  55.8 kg)   Body mass index is 24.02 kg/m.  General: Well developed, well nourished friendly WF, in no acute distress. Head: Normocephalic, atraumatic, sclera non-icteric, no xanthomas, nares are without discharge. Neck: Negative for carotid bruits. JVD not elevated. Lungs: Mildly diminished BS throughout with faint crackles 1/2 way up bilaterally. Breathing is unlabored on O2. Heart: RRR with S1 S2. Blowing systolic murmur heard over entire precordium, best at RUSB and LLSB. No rubs or or gallops appreciated. Abdomen: Soft, non-tender, non-distended with normoactive bowel sounds. No hepatomegaly. No rebound/guarding. No obvious abdominal masses. Msk:  Strength and tone appear normal for age. Extremities: No clubbing or cyanosis. No edema.  Distal pedal pulses are 2+ and equal bilaterally. Neuro: Alert and oriented X 3. No facial asymmetry. No focal deficit. Moves all extremities spontaneously. Psych:  Responds to questions appropriately with a normal affect.  EKG:  The EKG was personally reviewed and demonstrates NSR 87bpm, voltage criteria for LVH with nonspecific ST-T changes. No sig change from prior  Relevant CV Studies: As above  Laboratory Data:  Chemistry Recent Labs  Lab 12/28/17 1123 12/29/17 0443  NA 133* 136  K 3.9 3.7  CL 101 102  CO2 21* 24  GLUCOSE 117* 104*  BUN 12 13  CREATININE 0.75 0.66  CALCIUM 9.9 9.2  GFRNONAA >60 >60  GFRAA >60 >60  ANIONGAP 11 10    Recent Labs  Lab 12/28/17 1123  PROT 8.4*  ALBUMIN 3.9  AST 39  ALT 14  ALKPHOS 86  BILITOT 0.7   Hematology Recent Labs  Lab 12/28/17 1123  WBC 19.6*  RBC 4.64  HGB 13.2  HCT 39.5  MCV  85.1  MCH 28.4  MCHC 33.4  RDW 14.3  PLT 411*   Cardiac Enzymes Recent Labs  Lab 12/28/17 1123 12/29/17 0443  TROPONINI <0.03 <0.03   No results for input(s): TROPIPOC in the last 168 hours.  BNP Recent Labs  Lab 12/28/17 1123 12/29/17 0443  BNP 170.7* 77.6    DDimer No results for input(s): DDIMER in the last 168 hours.  Radiology/Studies:  Dg Chest 2 View  Result Date: 12/28/2017 CLINICAL DATA:  7-8 days of shortness of breath with wheezing and cough. History of CHF, interstitial lung disease, nonsmoker. EXAM: CHEST - 2 VIEW COMPARISON:  Chest x-ray of November 25, 2017 FINDINGS: The lungs are adequately inflated. The interstitial markings are more prominent diffusely today. There is no alveolar infiltrate. There is no pleural effusion. The heart and pulmonary vascularity are normal. The trachea is midline. The bony thorax exhibits no acute abnormality. IMPRESSION: Increased interstitial markings bilaterally without evidence of pulmonary vascular congestion or cardiomegaly. No alveolar pneumonia. The findings may reflect an acute pneumonitis type process. Electronically Signed   By: David  Martinique M.D.   On: 12/28/2017 11:18   Ct Chest Wo Contrast  Result Date: 12/28/2017 CLINICAL DATA:  Shortness of breath and chronic cough for 8 days, abnormal chest radiograph EXAM: CT CHEST WITHOUT CONTRAST TECHNIQUE: Multidetector CT imaging of the chest was performed following the standard protocol without IV contrast. Sagittal and coronal MPR images reconstructed from axial data set. COMPARISON:  None; correlation chest radiograph 12/28/2017 FINDINGS: Cardiovascular: Atherosclerotic calcifications of aorta and coronary arteries. Aorta normal caliber. Heart normal size. No pericardial effusion. Mediastinum/Nodes: Esophagus normal appearance. Base of cervical region normal appearance. Multiple normal size mediastinal lymph nodes with a single enlarged precarinal lymph node 12 mm short axis.  Lungs/Pleura: Diffuse BILATERAL airspace infiltrates are seen throughout both lungs  with a slight upper lobe predominance. This could represent infection or edema. Minimal central peribronchial thickening. No pleural effusion or pneumothorax. Single calcified pulmonary granuloma LEFT lower lobe. No additional pulmonary mass/nodule. Upper Abdomen: Visualized upper abdomen unremarkable. Musculoskeletal: Chronic appearing compression deformity of T10 vertebral body. No acute osseous findings. IMPRESSION: Diffuse BILATERAL airspace infiltrates throughout both lungs with a slight upper lobe predominance, could represent pulmonary edema or infection. Single nonspecific mildly enlarged precarinal lymph node potentially reactive. Coronary artery atherosclerotic calcifications. Aortic Atherosclerosis (ICD10-I70.0). Electronically Signed   By: Lavonia Dana M.D.   On: 12/28/2017 16:29    Assessment and Plan:   1. Recurrent hypoxia, cough, shortness of breath, orthopnea in the presence of persistent infiltrates on imaging 2. 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 Hypertrophic obstructive cardiomyopathy 4. Chronic diastolic CHF 5. Coronary calcification on CT scan with non-diagnostic ETT due to HR, but no ischemia noted on study 6. HTN  I spoke with Dr. Stanford Breed this AM who saw the patient yesterday. Ms. Avilla has had smoldering intermittent presentations for this same issue since 05/2017 without clear lasting impact from cardiac med adjustment. Initial BNP 170 -> now 77 this AM which seems out of proportion to her degree of symptoms. She is also steadily losing weight and also endorses occasional chills but no fever. Dr. Stanford Breed strongly encourages pulmonary review case further to determine further eval. Spoke with pulm NP about presence of aureobasidium pullulans antibody on her pneumonitis panel back in 10/2017. I was not previously familiar with this but there appears there have been case studies  about this residential mold causing similar symptoms of fatigue, dyspnea, hypoxia, and infiltrates. I believe this is worth looking into. Stat echo has been performed. Right heart cath may be helpful at some point per d/w MD, but appreciate pulm input with regard to the above. She does have coronary calcification on CT scan but I do not think her clinical presentation and infiltrates would be caused by this. Pulm have ordered high res CT. Hold off on further diuresis, pending MD review and echo report.   For questions or updates, please contact [place] Please consult [email or web address] for contact info under Cardiology/STEMI.    Signed, Charlie Pitter, PA-C  12/29/2017 8:41 AM  Patient examined chart reviewed Spent over 30 minutes reviewing her previous hospitalization, echo MRI and disussing issues with Dr Stanford Breed yesterday. He lung exam is markedly abnormal with velcro like crackles. With elevated WBC and left shift this suggest primary pulmonary issue. Discussed with Dr Lamonte Sakai he does not want to give steroids in leiu of getting bronchoscopy with BAL. Will give lasix 20 mg iv this am and arrange right heart catheterization to guide diuresis since she has gotten hypotensive and required levophed in past with over diuresis Will review echo   Jenkins Rouge

## 2017-12-30 ENCOUNTER — Encounter (HOSPITAL_COMMUNITY)
Admission: EM | Disposition: A | Discharge status: Home / Self Care | Payer: Self-pay | Source: Home / Self Care | Attending: Internal Medicine

## 2017-12-30 HISTORY — PX: RIGHT HEART CATH: CATH118263

## 2017-12-30 LAB — CBC WITH DIFFERENTIAL/PLATELET
BASOS PCT: 0 %
Basophils Absolute: 0 10*3/uL (ref 0.0–0.1)
Eosinophils Absolute: 0.8 10*3/uL — ABNORMAL HIGH (ref 0.0–0.7)
Eosinophils Relative: 6 %
HEMATOCRIT: 36.4 % (ref 36.0–46.0)
Hemoglobin: 11.6 g/dL — ABNORMAL LOW (ref 12.0–15.0)
Lymphocytes Relative: 17 %
Lymphs Abs: 2.1 10*3/uL (ref 0.7–4.0)
MCH: 27.9 pg (ref 26.0–34.0)
MCHC: 31.9 g/dL (ref 30.0–36.0)
MCV: 87.5 fL (ref 78.0–100.0)
MONO ABS: 0.9 10*3/uL (ref 0.1–1.0)
MONOS PCT: 7 %
NEUTROS ABS: 8.6 10*3/uL — AB (ref 1.7–7.7)
Neutrophils Relative %: 70 %
Platelets: 344 10*3/uL (ref 150–400)
RBC: 4.16 MIL/uL (ref 3.87–5.11)
RDW: 14.4 % (ref 11.5–15.5)
WBC: 12.4 10*3/uL — ABNORMAL HIGH (ref 4.0–10.5)

## 2017-12-30 LAB — POCT I-STAT 3, VENOUS BLOOD GAS (G3P V)
Acid-Base Excess: 1 mmol/L (ref 0.0–2.0)
BICARBONATE: 24.7 mmol/L (ref 20.0–28.0)
BICARBONATE: 24.9 mmol/L (ref 20.0–28.0)
BICARBONATE: 26.4 mmol/L (ref 20.0–28.0)
O2 SAT: 70 %
O2 SAT: 70 %
O2 Saturation: 64 %
PCO2 VEN: 39.9 mmHg — AB (ref 44.0–60.0)
PH VEN: 7.401 (ref 7.250–7.430)
PO2 VEN: 33 mmHg (ref 32.0–45.0)
PO2 VEN: 36 mmHg (ref 32.0–45.0)
TCO2: 26 mmol/L (ref 22–32)
TCO2: 26 mmol/L (ref 22–32)
TCO2: 28 mmol/L (ref 22–32)
pCO2, Ven: 39.7 mmHg — ABNORMAL LOW (ref 44.0–60.0)
pCO2, Ven: 42.6 mmHg — ABNORMAL LOW (ref 44.0–60.0)
pH, Ven: 7.399 (ref 7.250–7.430)
pH, Ven: 7.406 (ref 7.250–7.430)
pO2, Ven: 36 mmHg (ref 32.0–45.0)

## 2017-12-30 LAB — POCT I-STAT 3, ART BLOOD GAS (G3+)
BICARBONATE: 24.9 mmol/L (ref 20.0–28.0)
O2 Saturation: 91 %
PCO2 ART: 38.7 mmHg (ref 32.0–48.0)
PH ART: 7.416 (ref 7.350–7.450)
PO2 ART: 59 mmHg — AB (ref 83.0–108.0)
TCO2: 26 mmol/L (ref 22–32)

## 2017-12-30 LAB — APTT: aPTT: 41 seconds — ABNORMAL HIGH (ref 24–36)

## 2017-12-30 LAB — BASIC METABOLIC PANEL
Anion gap: 9 (ref 5–15)
BUN: 15 mg/dL (ref 6–20)
CALCIUM: 9.2 mg/dL (ref 8.9–10.3)
CO2: 24 mmol/L (ref 22–32)
CREATININE: 0.69 mg/dL (ref 0.44–1.00)
Chloride: 100 mmol/L — ABNORMAL LOW (ref 101–111)
GFR calc non Af Amer: >60 mL/min (ref >60)
GLUCOSE: 110 mg/dL — AB (ref 65–99)
Potassium: 3.6 mmol/L (ref 3.5–5.1)
Sodium: 133 mmol/L — ABNORMAL LOW (ref 135–145)

## 2017-12-30 LAB — PROTIME-INR
INR: 1.11
Prothrombin Time: 14.2 seconds (ref 11.4–15.2)

## 2017-12-30 SURGERY — RIGHT HEART CATH

## 2017-12-30 MED ORDER — SODIUM CHLORIDE 0.9% FLUSH: 3.0000 mL | INTRAVENOUS | Status: DC | PRN

## 2017-12-30 MED ORDER — SODIUM CHLORIDE 0.9 % IV SOLN: 250.0000 mL | INTRAVENOUS | Status: DC | PRN

## 2017-12-30 MED ORDER — ORAL CARE MOUTH RINSE
15.0000 mL | Freq: Two times a day (BID) | OROMUCOSAL | Status: DC
  Administered 2018-01-01 – 2018-01-03 (×3): 15 mL via OROMUCOSAL

## 2017-12-30 MED ORDER — SODIUM CHLORIDE 0.9% FLUSH
3.0000 mL | Freq: Two times a day (BID) | INTRAVENOUS | Status: DC
  Administered 2017-12-30 (×2): 3 mL via INTRAVENOUS

## 2017-12-30 SURGICAL SUPPLY — 9 items
CATH SWAN GANZ 7F STRAIGHT (CATHETERS) ×3
KIT HEART LEFT (KITS) ×3
PACK CARDIAC CATHETERIZATION (CUSTOM PROCEDURE TRAY) ×3
PROTECTION STATION PRESSURIZED (MISCELLANEOUS) ×3
SHEATH AVANTI 11CM 7FR (SHEATH) ×3
SYR MEDRAD MARK V 150ML (SYRINGE) ×3
TRANSDUCER W/STOPCOCK (MISCELLANEOUS) ×3
TUBING CIL FLEX 10 FLL-RA (TUBING) ×3
WIRE EMERALD 3MM-J .025X260CM (WIRE) ×3

## 2017-12-30 NOTE — Progress Notes (Signed)
867fr sheath aspirated and removed from rfv by Smokey Twine. Manual pressure relieved by Lance BoschBryan Justun Anaya. Manual pressure applied for 15 minutes. Groin level 0 no s+s of hematoma. Tegaderm dressing applied, bedrest instructions given. Bilateral dp and pt pulses palpable.  Bedrest begins at 09:45:00

## 2017-12-30 NOTE — Interval H&P Note (Signed)
History and Physical Interval Note:  12/30/2017 8:40 AM  Hannah Colon  has presented today for surgery, with the diagnosis of hocm  The various methods of treatment have been discussed with the patient and family. After consideration of risks, benefits and other options for treatment, the patient has consented to  Procedure(s): RIGHT HEART CATH (N/A) as a surgical intervention .  The patient's history has been reviewed, patient examined, no change in status, stable for surgery.  I have reviewed the patient's chart and labs.  Questions were answered to the patient's satisfaction.     [name]

## 2017-12-30 NOTE — Progress Notes (Signed)
PULMONARY / CRITICAL CARE MEDICINE   Name: Hannah Colon MRN: [id number] DOB: 09-10-1956    ADMISSION DATE:  12/28/2017 CONSULTATION DATE:  12/28/17  REFERRING MD:  Lorin Mercy - [facility]  CHIEF COMPLAINT:  SOB  HISTORY OF PRESENT ILLNESS:   Hannah Colon is a 62 y.o. F with PMH as outlined below including dCHF and HOCM.  She is scheduled to be seen at [facility] (scheduled to see Dr. Mina Marble end of April 2019) and is also followed by Dr. Stanford Breed as outpatient in [place].  In the past, he did not tolerate vigorous diuresis presumed due to decreased preload exacerbating LVOT gradient.  She saw Dr. Stanford Breed 3/27 for worsening dyspnea.  Symptoms had worsened despite increased lasix and toprol (though BP did not tolerate increase in toprol).  In the office, she was found to have SpO2 of 81%.  Given her hypoxia, decision was made to admit her for further evaluation.  Cardiology is attempting to contact Dr. Mina Marble at [facility] to inquire if she could be transferred there now versus wait until appointment in April. CXR was obtained and demonstrated increased interstitial markings with question of pneumonitis.  In the past, there was question of ILD; therefore, PCCM was called again in consultation for further recs.  She arrived to [facility] early AM hours 3/28.  Overnight at [facility], she received 14m lasix after she was found to have bilateral crackles.  Pt reports this did in fact help her breathing and this morning, she feels much more comfortable and breathing much easier. She does endorse cough for past 8 days, dry and non-productive.  Denies any chest pain, fevers/chills/sweats, myalgias.  No exposures to known sick contacts.  No recent travel.  CT scan from January 2019 showed diffuse ground glass opacities.  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 Walking desaturation test on 11/10/2017 185 feet x 3 laps on  ROOM AIR:  did not desaturate. Rest pulse ox was 99%, final pulse ox was 91%. HR response 76/min at rest to 84/min at peak exertion.  Plans were to follow up as outpatient in 2 months and at some point, assess HRCT of the chest both supine and prone.   SUBJECTIVE:   Stable dyspnea, continues to have cough, more short winded when she tries to exert   VITAL SIGNS: BP 119/69   Pulse 86   Temp 98 F (36.7 C) (Oral)   Resp (!) 31   Ht 5' (1.524 m)   Wt 54.7 kg (120 lb 9.6 oz)   SpO2 97%   BMI 23.55 kg/m   HEMODYNAMICS:    VENTILATOR SETTINGS:    INTAKE / OUTPUT: I/O last 3 completed shifts: In: 640 [P.O.:640] Out: 2600 [Urine:2600]  PHYSICAL EXAMINATION: General: Adult female, resting in bed, in NAD. Neuro: A&O x 3, non-focal.  HEENT: [place]/AT. EOMI, sclerae anicteric. Cardiovascular: RRR, 4/6 SEM.  Lungs: Respirations even and unlabored.  Very faint basilar crackles. Abdomen: BS x 4, soft, NT/ND.  Musculoskeletal: No gross deformities, no edema.  Skin: Intact, warm, no rashes.   LABS:  BMET Recent Labs  Lab 12/28/17 1123 12/29/17 0443 12/30/17 0227  NA 133* 136 133*  K 3.9 3.7 3.6  CL 101 102 100*  CO2 21* 24 24  BUN _0 CREATININE 0.75 0.66 0.69  GLUCOSE 117* 104* 110*    Electrolytes Recent Labs  Lab 12/28/17 1123 12/29/17 0443 12/30/17 0227  CALCIUM 9.9  9.2 9.2    CBC Recent Labs  Lab 12/28/17 1123 12/30/17 0227  WBC 19.6* 12.4*  HGB 13.2 11.6*  HCT 39.5 36.4  PLT 411* 344    Coag's Recent Labs  Lab 12/30/17 0227  APTT 41*  INR 1.11    Sepsis Markers Recent Labs  Lab 12/28/17 1709  LATICACIDVEN 1.76    ABG No results for input(s): PHART, PCO2ART, PO2ART in the last 168 hours.  Liver Enzymes Recent Labs  Lab 12/28/17 1123  AST 39  ALT 14  ALKPHOS 86  BILITOT 0.7  ALBUMIN 3.9    Cardiac Enzymes Recent Labs  Lab 12/28/17 1123 12/29/17 0443  TROPONINI <0.03 <0.03    Glucose No results for input(s):  GLUCAP in the last 168 hours.  Imaging No results found.   STUDIES:  CXR 3/27 > increased interstitial markings. CT chest 3/27 > diffuse bilateral airspace infiltrates.  CULTURES: Blood 3/27 >  ANTIBIOTICS: Levaquin 3/27 >   SIGNIFICANT EVENTS: 3/27 > admit.  LINES/TUBES: None.  DISCUSSION: 62 y.o. F with PMH including dCHF and HOCM (followed by Dr. Stanford Breed and scheduled to see Dr. Mina Marble at [facility] end of April).  Was thought to have component of ILD in the past as CT scan from January 2019 showed diffuse ground glass opacities.  Seen by Dr. 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  Plans were to obtain HRCT supine and prone in the near future. Now admitted 3/27 with dyspnea again.  Felt to be due to CHF / HOCM; however, non-contrast CT chest again showed diffuse bilateral airspace infiltrates; therefore, PCCM asked to see in consultation.   ASSESSMENT / PLAN:  Acute hypoxic respiratory failure - in part due to CHF exacerbation in setting of HOCM.  Right heart cath 3/29 shows grossly normal pressures, PAOP 10 -all reassuring Persistent bilateral groundglass pulmonary infiltrates, etiology unclear- has had negative autoimmune workup in the past     Plan: Levaquin discontinued in absence of any clear evidence for bacterial pneumonia Respiratory virus panel was negative Based on her right heart cath data I believe she needs bronchoscopy with BAL and transbronchial biopsies.  I had set this up for today 3/29 but because she had aspirin today I think it would be better to defer until Monday.  This will allow [place] to stop the aspirin and also allow for some continued diuresis to look for clinical and radiographical improvement.  I explained to her today that I believe endoscopy is next appropriate step, that I suspect her infiltrates will persist even with diuresis given her right heart cath  data.  Baltazar Apo, MD, PhD 12/30/2017, 11:41 AM [facility] [phone] or if no answer [phone]

## 2017-12-30 NOTE — Progress Notes (Signed)
Pulmonary and critical care and cardiology saw the patient today so there is no need for try to see him today.  We will keep him in Team 1 and see him again tomorrow morning.

## 2017-12-30 NOTE — H&P (View-Only) (Signed)
Subjective:  Had great urine output with just 20 mg lasix yesterday did not need 2nd dose going for right heart cath. No left heart dye allergy   Objective:  Vitals:   12/29/17 2200 12/29/17 2356 12/30/17 0134 12/30/17 0430  BP: 115/80  114/68   Pulse: (!) 104  (!) 103   Resp: (!) 29  16   Temp:  98.5 F (36.9 C)  98 F (36.7 C)  TempSrc:  Oral  Oral  SpO2: 93%  92%   Weight: 120 lb 9.6 oz (54.7 kg)     Height:        Intake/Output from previous day:  Intake/Output Summary (Last 24 hours) at 12/30/2017 0816 Last data filed at 12/30/2017 0100 Gross per 24 hour  Intake 300 ml  Output 2350 ml  Net -2050 ml    Physical Exam: Affect appropriate Healthy:  appears stated age HEENT: normal Neck supple with no adenopathy JVP normal no bruits no thyromegaly Lungs course crackles persist bilaterally no  wheezing and good diaphragmatic motion Heart:  S1/S2 HOCM murmur MR , no rub, gallop or click PMI normal Abdomen: benighn, BS positve, no tenderness, no AAA no bruit.  No HSM or HJR Distal pulses intact with no bruits No edema Neuro non-focal Skin warm and dry No muscular weakness   Lab Results: Basic Metabolic Panel: Recent Labs    12/29/17 0443 12/30/17 0227  NA 136 133*  K 3.7 3.6  CL 102 100*  CO2 24 24  GLUCOSE 104* 110*  BUN 13 15  CREATININE 0.66 0.69  CALCIUM 9.2 9.2   Liver Function Tests: Recent Labs    12/28/17 1123  AST 39  ALT 14  ALKPHOS 86  BILITOT 0.7  PROT 8.4*  ALBUMIN 3.9   No results for input(s): LIPASE, AMYLASE in the last 72 hours. CBC: Recent Labs    12/28/17 1123 12/30/17 0227  WBC 19.6* 12.4*  NEUTROABS 17.2* 8.6*  HGB 13.2 11.6*  HCT 39.5 36.4  MCV 85.1 87.5  PLT 411* 344   Cardiac Enzymes: Recent Labs    12/28/17 1123 12/29/17 0443  TROPONINI <0.03 <0.03    Imaging: Dg Chest 2 View  Result Date: 12/28/2017 CLINICAL DATA:  7-8 days of shortness of breath with wheezing and cough. History of CHF,  interstitial lung disease, nonsmoker. EXAM: CHEST - 2 VIEW COMPARISON:  Chest x-ray of November 25, 2017 FINDINGS: The lungs are adequately inflated. The interstitial markings are more prominent diffusely today. There is no alveolar infiltrate. There is no pleural effusion. The heart and pulmonary vascularity are normal. The trachea is midline. The bony thorax exhibits no acute abnormality. IMPRESSION: Increased interstitial markings bilaterally without evidence of pulmonary vascular congestion or cardiomegaly. No alveolar pneumonia. The findings may reflect an acute pneumonitis type process. Electronically Signed   By: David  Martinique M.D.   On: 12/28/2017 11:18   Ct Chest Wo Contrast  Result Date: 12/28/2017 CLINICAL DATA:  Shortness of breath and chronic cough for 8 days, abnormal chest radiograph EXAM: CT CHEST WITHOUT CONTRAST TECHNIQUE: Multidetector CT imaging of the chest was performed following the standard protocol without IV contrast. Sagittal and coronal MPR images reconstructed from axial data set. COMPARISON:  None; correlation chest radiograph 12/28/2017 FINDINGS: Cardiovascular: Atherosclerotic calcifications of aorta and coronary arteries. Aorta normal caliber. Heart normal size. No pericardial effusion. Mediastinum/Nodes: Esophagus normal appearance. Base of cervical region normal appearance. Multiple normal size mediastinal lymph nodes with a single enlarged precarinal lymph node  12 mm short axis. Lungs/Pleura: Diffuse BILATERAL airspace infiltrates are seen throughout both lungs with a slight upper lobe predominance. This could represent infection or edema. Minimal central peribronchial thickening. No pleural effusion or pneumothorax. Single calcified pulmonary granuloma LEFT lower lobe. No additional pulmonary mass/nodule. Upper Abdomen: Visualized upper abdomen unremarkable. Musculoskeletal: Chronic appearing compression deformity of T10 vertebral body. No acute osseous findings. IMPRESSION:  Diffuse BILATERAL airspace infiltrates throughout both lungs with a slight upper lobe predominance, could represent pulmonary edema or infection. Single nonspecific mildly enlarged precarinal lymph node potentially reactive. Coronary artery atherosclerotic calcifications. Aortic Atherosclerosis (ICD10-I70.0). Electronically Signed   By: Lavonia Dana M.D.   On: 12/28/2017 16:29    Cardiac Studies:  ECG: SR LVH no acute changes    Telemetry: NSR no arrhythmia   Echo: Study Conclusions  - Left ventricle: There is marked SAM but dagger shaped LVOT   gradient only in the 2.5 m/sec range. The cavity size was normal.   Wall thickness was increased in a pattern of mild LVH. Systolic   function was vigorous. The estimated ejection fraction was in the   range of 65% to 70%. - Aortic valve: There appears to be some degree of fixed AS likely   in the mild to moderate range Morphologically not severe. Valve   area (VTI): 0.54 cm^2. Valve area (Vmax): 0.67 cm^2. Valve area   (Vmean): 0.62 cm^2. - Mitral valve: Eccentric MR looks mild to moderate in most views 2   chamber moderate overall Severely calcified annulus. Valve area   by continuity equation (using LVOT flow): 1.21 cm^2. - Atrial septum: No defect or patent foramen ovale was identified.  Medications:   . [MAR Hold] aspirin  81 mg Oral Daily  . [MAR Hold] atorvastatin  20 mg Oral QPC breakfast  . [MAR Hold] azelastine  1 spray Each Nare BID  . [MAR Hold] fluticasone  1 spray Each Nare Daily  . [MAR Hold] heparin  5,000 Units Subcutaneous Q8H  . [MAR Hold] metoprolol succinate  100 mg Oral Daily  . [MAR Hold] sodium chloride flush  3 mL Intravenous Q12H  . sodium chloride flush  3 mL Intravenous Q12H     . [MAR Hold] sodium chloride    . sodium chloride    . sodium chloride 50 mL/hr at 12/30/17 0513    Assessment/Plan:  HOCM:  Severe SAM but gradients hard to decipher from MR signal do not appear super high MR moderate with no  acute chord rupture. ? ILD and acute pneumonitis with multiple abnormal CT;s elevated WBC on admission If right heart pressures ok will proceed with f/u high resolution CT to see if "infiltrates"/pneumonitis has cleared Pulmonary seeing and may need bronchoscopy With BAL. Holding off on steroids for now. No signs of active infection. Will likely need referral to  [facility] at some point for septal ablation or traditional HOCM surgery given relatively young age She has become hypotensive with over-diuresis Will ask DB to write daily lasix order after cath  Jenkins Rouge 12/30/2017, 8:16 AM

## 2017-12-30 NOTE — Progress Notes (Signed)
Subjective:  Had great urine output with just 20 mg lasix yesterday did not need 2nd dose going for right heart cath. No left heart dye allergy   Objective:  Vitals:   12/29/17 2200 12/29/17 2356 12/30/17 0134 12/30/17 0430  BP: 115/80  114/68   Pulse: (!) 104  (!) 103   Resp: (!) 29  16   Temp:  98.5 F (36.9 C)  98 F (36.7 C)  TempSrc:  Oral  Oral  SpO2: 93%  92%   Weight: 120 lb 9.6 oz (54.7 kg)     Height:        Intake/Output from previous day:  Intake/Output Summary (Last 24 hours) at 12/30/2017 0816 Last data filed at 12/30/2017 0100 Gross per 24 hour  Intake 300 ml  Output 2350 ml  Net -2050 ml    Physical Exam: Affect appropriate Healthy:  appears stated age HEENT: normal Neck supple with no adenopathy JVP normal no bruits no thyromegaly Lungs course crackles persist bilaterally no  wheezing and good diaphragmatic motion Heart:  S1/S2 HOCM murmur MR , no rub, gallop or click PMI normal Abdomen: benighn, BS positve, no tenderness, no AAA no bruit.  No HSM or HJR Distal pulses intact with no bruits No edema Neuro non-focal Skin warm and dry No muscular weakness   Lab Results: Basic Metabolic Panel: Recent Labs    12/29/17 0443 12/30/17 0227  NA 136 133*  K 3.7 3.6  CL 102 100*  CO2 24 24  GLUCOSE 104* 110*  BUN 13 15  CREATININE 0.66 0.69  CALCIUM 9.2 9.2   Liver Function Tests: Recent Labs    12/28/17 1123  AST 39  ALT 14  ALKPHOS 86  BILITOT 0.7  PROT 8.4*  ALBUMIN 3.9   No results for input(s): LIPASE, AMYLASE in the last 72 hours. CBC: Recent Labs    12/28/17 1123 12/30/17 0227  WBC 19.6* 12.4*  NEUTROABS 17.2* 8.6*  HGB 13.2 11.6*  HCT 39.5 36.4  MCV 85.1 87.5  PLT 411* 344   Cardiac Enzymes: Recent Labs    12/28/17 1123 12/29/17 0443  TROPONINI <0.03 <0.03    Imaging: Dg Chest 2 View  Result Date: 12/28/2017 CLINICAL DATA:  7-8 days of shortness of breath with wheezing and cough. History of CHF,  interstitial lung disease, nonsmoker. EXAM: CHEST - 2 VIEW COMPARISON:  Chest x-ray of November 25, 2017 FINDINGS: The lungs are adequately inflated. The interstitial markings are more prominent diffusely today. There is no alveolar infiltrate. There is no pleural effusion. The heart and pulmonary vascularity are normal. The trachea is midline. The bony thorax exhibits no acute abnormality. IMPRESSION: Increased interstitial markings bilaterally without evidence of pulmonary vascular congestion or cardiomegaly. No alveolar pneumonia. The findings may reflect an acute pneumonitis type process. Electronically Signed   By: David  Martinique M.D.   On: 12/28/2017 11:18   Ct Chest Wo Contrast  Result Date: 12/28/2017 CLINICAL DATA:  Shortness of breath and chronic cough for 8 days, abnormal chest radiograph EXAM: CT CHEST WITHOUT CONTRAST TECHNIQUE: Multidetector CT imaging of the chest was performed following the standard protocol without IV contrast. Sagittal and coronal MPR images reconstructed from axial data set. COMPARISON:  None; correlation chest radiograph 12/28/2017 FINDINGS: Cardiovascular: Atherosclerotic calcifications of aorta and coronary arteries. Aorta normal caliber. Heart normal size. No pericardial effusion. Mediastinum/Nodes: Esophagus normal appearance. Base of cervical region normal appearance. Multiple normal size mediastinal lymph nodes with a single enlarged precarinal lymph node  12 mm short axis. Lungs/Pleura: Diffuse BILATERAL airspace infiltrates are seen throughout both lungs with a slight upper lobe predominance. This could represent infection or edema. Minimal central peribronchial thickening. No pleural effusion or pneumothorax. Single calcified pulmonary granuloma LEFT lower lobe. No additional pulmonary mass/nodule. Upper Abdomen: Visualized upper abdomen unremarkable. Musculoskeletal: Chronic appearing compression deformity of T10 vertebral body. No acute osseous findings. IMPRESSION:  Diffuse BILATERAL airspace infiltrates throughout both lungs with a slight upper lobe predominance, could represent pulmonary edema or infection. Single nonspecific mildly enlarged precarinal lymph node potentially reactive. Coronary artery atherosclerotic calcifications. Aortic Atherosclerosis (ICD10-I70.0). Electronically Signed   By: Lavonia Dana M.D.   On: 12/28/2017 16:29    Cardiac Studies:  ECG: SR LVH no acute changes    Telemetry: NSR no arrhythmia   Echo: Study Conclusions  - Left ventricle: There is marked SAM but dagger shaped LVOT   gradient only in the 2.5 m/sec range. The cavity size was normal.   Wall thickness was increased in a pattern of mild LVH. Systolic   function was vigorous. The estimated ejection fraction was in the   range of 65% to 70%. - Aortic valve: There appears to be some degree of fixed AS likely   in the mild to moderate range Morphologically not severe. Valve   area (VTI): 0.54 cm^2. Valve area (Vmax): 0.67 cm^2. Valve area   (Vmean): 0.62 cm^2. - Mitral valve: Eccentric MR looks mild to moderate in most views 2   chamber moderate overall Severely calcified annulus. Valve area   by continuity equation (using LVOT flow): 1.21 cm^2. - Atrial septum: No defect or patent foramen ovale was identified.  Medications:   . [MAR Hold] aspirin  81 mg Oral Daily  . [MAR Hold] atorvastatin  20 mg Oral QPC breakfast  . [MAR Hold] azelastine  1 spray Each Nare BID  . [MAR Hold] fluticasone  1 spray Each Nare Daily  . [MAR Hold] heparin  5,000 Units Subcutaneous Q8H  . [MAR Hold] metoprolol succinate  100 mg Oral Daily  . [MAR Hold] sodium chloride flush  3 mL Intravenous Q12H  . sodium chloride flush  3 mL Intravenous Q12H     . [MAR Hold] sodium chloride    . sodium chloride    . sodium chloride 50 mL/hr at 12/30/17 0513    Assessment/Plan:  HOCM:  Severe SAM but gradients hard to decipher from MR signal do not appear super high MR moderate with no  acute chord rupture. ? ILD and acute pneumonitis with multiple abnormal CT;s elevated WBC on admission If right heart pressures ok will proceed with f/u high resolution CT to see if "infiltrates"/pneumonitis has cleared Pulmonary seeing and may need bronchoscopy With BAL. Holding off on steroids for now. No signs of active infection. Will likely need referral to  [facility] at some point for septal ablation or traditional HOCM surgery given relatively young age She has become hypotensive with over-diuresis Will ask DB to write daily lasix order after cath  Jenkins Rouge 12/30/2017, 8:16 AM

## 2017-12-30 NOTE — Progress Notes (Signed)
Nutrition Brief Note  Patient identified on the Malnutrition Screening Tool (MST) Report  Wt Readings from Last 15 Encounters:  12/29/17 120 lb 9.6 oz (54.7 kg)  12/28/17 123 lb 12.8 oz (56.2 kg)  12/08/17 128 lb (58.1 kg)  11/10/17 130 lb 9.6 oz (59.2 kg)  11/09/17 129 lb 6.4 oz (58.7 kg)  10/26/17 132 lb 1.9 oz (59.9 kg)  10/13/17 130 lb 8.2 oz (59.2 kg)  10/04/17 133 lb 14.4 oz (60.7 kg)  09/30/17 138 lb (62.6 kg)  08/10/17 138 lb (62.6 kg)  06/10/17 136 lb 12.8 oz (62.1 kg)  05/23/17 136 lb 11 oz (62 kg)  05/19/17 139 lb (63 kg)  05/13/17 139 lb 5.3 oz (63.2 kg)   Weight has trended down since January but this as in part intentional per discussion with patient. Since January, pt has been following a strict heart failure diet, limiting sodium and fluid. Pt has changed her eating habits, keeps a detailed log of what she eats and monitors her sodium and fluid intake. Pt eats 3 meals per day; breakfast is usually cereal with fruit or greek yogurt with fruit/granola, lunch is usually a salad with grilled chicken and dinner varies. Pt usually will have a snack of sherbet or sorbet. Reinforced importance of adequate nutrition but praised patient for her lifestyle changes. Encouraged patient to watch weight closely.   Body mass index is 23.55 kg/m.   Nutrition-Focused Physical Exam complete. No fat depletion or muscle wasting noted; no edema present.   Current diet order is Heart Healthy, patient is consuming approximately 100% of meals at this time. Labs and medications reviewed.   Pt is very knowledgeable about heart failure diet. RD had in-depth conversation with regards to this and pt had no questions at this time.   No nutrition interventions warranted at this time. If nutrition issues arise, please consult RD.   Romelle Starcherate Nastassja Witkop MS, RD, LDN, CNSC [phone] Pager  [phone] Weekend/On-Call Pager

## 2017-12-30 NOTE — Progress Notes (Signed)
Fine crackles noted on assessment, SOB improving; MD Chakravartti notified. No new orders. RN will continue to monitor.

## 2017-12-31 DIAGNOSIS — Z77120 Contact with and (suspected) exposure to mold (toxic): Secondary | ICD-10-CM

## 2017-12-31 DIAGNOSIS — R053 Chronic cough: Secondary | ICD-10-CM

## 2017-12-31 DIAGNOSIS — R05 Cough: Secondary | ICD-10-CM

## 2017-12-31 DIAGNOSIS — J849 Interstitial pulmonary disease, unspecified: Secondary | ICD-10-CM

## 2017-12-31 LAB — BASIC METABOLIC PANEL
Anion gap: 11 (ref 5–15)
BUN: 13 mg/dL (ref 6–20)
CALCIUM: 9.3 mg/dL (ref 8.9–10.3)
CO2: 23 mmol/L (ref 22–32)
CREATININE: 0.61 mg/dL (ref 0.44–1.00)
Chloride: 102 mmol/L (ref 101–111)
GFR calc non Af Amer: >60 mL/min (ref >60)
Glucose, Bld: 108 mg/dL — ABNORMAL HIGH (ref 65–99)
Potassium: 4 mmol/L (ref 3.5–5.1)
SODIUM: 136 mmol/L (ref 135–145)

## 2017-12-31 MED ORDER — SODIUM CHLORIDE 0.9% FLUSH: 3.0000 mL | INTRAVENOUS | Status: DC | PRN

## 2017-12-31 MED ORDER — HYDROCODONE-HOMATROPINE 5-1.5 MG/5ML PO SYRP
5.0000 mL | ORAL_SOLUTION | ORAL | Status: DC | PRN
  Administered 2017-12-31 – 2018-01-04 (×13): 5 mL via ORAL
  Filled 2017-12-31 (×13): qty 5

## 2017-12-31 MED ORDER — SODIUM CHLORIDE 0.9% FLUSH
3.0000 mL | Freq: Two times a day (BID) | INTRAVENOUS | Status: DC
  Administered 2017-12-31 – 2018-01-03 (×6): 3 mL via INTRAVENOUS

## 2017-12-31 NOTE — Progress Notes (Signed)
[facility] TEAM 1 - Stepdown/ICU TEAM  Hannah Colon  YYQ:[id number] DOB: 04-Mar-1956 DOA: 12/28/2017 PCP: Elisabeth Cara, PA-C    Brief Narrative:  62yo F with a Hx of dCHF and HOCM who saw Dr. Stanford Breed 3/27 for worsening dyspnea.  Symptoms worsened despite increased lasix.  In the office, she was found to have SpO2 of 81%.  A CXR demonstrated increased interstitial markings with question of pneumonitis / ILD.  Since admission, Cardiology and PCCM have been attending to her care.    Significant Events: 3/27 admit 3/29 [facility] - no intracardiac shunt - relatively normal pressures/CO   Subjective: Pt seen by PCCM and Cards today.  No outstanding issues for TRH to address at this time.  Will f/u in AM.   Assessment & Plan:  Acute hypoxic respiratory failure - Persistent bilateral groundglass pulmonary infiltrates Hi-res CT with diffuse B airspace infiltrates throughout both lungs with a slight upper lobe predominance - appears to be improving w/ diuresis - has had negative autoimmune workup in the past - for FOB w/ BAL and transbronchial biopsies on 4/1  HOCM Right heart cath 3/29 shows grossly normal pressures, PAOP 10 - care per CHF Team   Mild/Mod AoS Per Cardiology   DVT prophylaxis: SQ heparin  Code Status: FULL CODE Family Communication: no family present at time of exam  Disposition Plan: SDU  Consultants:  PCCM Cardiology   Antimicrobials:  Levaquin 3/27   Objective: Blood pressure 108/61, pulse 79, temperature 97.7 F (36.5 C), temperature source Oral, resp. rate 20, height 5' (1.524 m), weight 54.7 kg (120 lb 9.6 oz), SpO2 96 %.  Intake/Output Summary (Last 24 hours) at 12/31/2017 1204 Last data filed at 12/31/2017 1048 Gross per 24 hour  Intake 960 ml  Output 2600 ml  Net -1640 ml   Filed Weights   12/28/17 2330 12/29/17 1300 12/29/17 2200  Weight: 55.8 kg (123 lb) 53.2 kg (117 lb 4.6 oz) 54.7 kg (120 lb 9.6 oz)    Examination: No exam today    CBC: Recent Labs  Lab 12/28/17 1123 12/30/17 0227  WBC 19.6* 12.4*  NEUTROABS 17.2* 8.6*  HGB 13.2 11.6*  HCT 39.5 36.4  MCV 85.1 87.5  PLT 411* 888   Basic Metabolic Panel: Recent Labs  Lab 12/29/17 0443 12/30/17 0227 12/31/17 0301  NA 136 133* 136  K 3.7 3.6 4.0  CL 102 100* 102  CO2 _0 GLUCOSE 104* 110* 108*  BUN _1 CREATININE 0.66 0.69 0.61  CALCIUM 9.2 9.2 9.3   GFR: Estimated Creatinine Clearance: 58.1 mL/min (by C-G formula based on SCr of 0.61 mg/dL).  Liver Function Tests: Recent Labs  Lab 12/28/17 1123  AST 39  ALT 14  ALKPHOS 86  BILITOT 0.7  PROT 8.4*  ALBUMIN 3.9    Coagulation Profile: Recent Labs  Lab 12/30/17 0227  INR 1.11    Cardiac Enzymes: Recent Labs  Lab 12/28/17 1123 12/29/17 0443  TROPONINI <0.03 <0.03    HbA1C: Hgb A1c MFr Bld  Date/Time Value Ref Range Status  10/03/2017 11:13 AM 5.5 4.8 - 5.6 % Final    Comment:    (NOTE) Pre diabetes:          5.7%-6.4% Diabetes:              >6.4% Glycemic control for   <7.0% adults with diabetes   05/13/2017 01:21 AM 5.2 4.8 - 5.6 % Final    Comment:    (  NOTE) Pre diabetes:          5.7%-6.4% Diabetes:              >6.4% Glycemic control for   <7.0% adults with diabetes      Recent Results (from the past 240 hour(s))  Blood culture (routine x 2)     Status: None (Preliminary result)   Collection Time: 12/28/17  4:55 PM  Result Value Ref Range Status   Specimen Description   Final    BLOOD LEFT ANTECUBITAL Performed at [facility], [address] [place], [place] [address]    Special Requests   Final    BOTTLES DRAWN AEROBIC AND ANAEROBIC Blood Culture adequate volume Performed at [facility], [address] [place], [place] [address]    Culture   Final    NO GROWTH 2 DAYS Performed at [facility], [place] [address], [place], [place] [address]    Report Status PENDING  Incomplete  Blood culture (routine x 2)      Status: None (Preliminary result)   Collection Time: 12/28/17  5:00 PM  Result Value Ref Range Status   Specimen Description   Final    BLOOD RIGHT ANTECUBITAL Performed at [facility], [address] [place], [place] [address]    Special Requests   Final    BOTTLES DRAWN AEROBIC AND ANAEROBIC Blood Culture adequate volume Performed at [facility], [address] [place], [place] [address]    Culture   Final    NO GROWTH 2 DAYS Performed at [facility], [place] [address], [place], [place] [address]    Report Status PENDING  Incomplete  MRSA PCR Screening     Status: None   Collection Time: 12/29/17  6:54 AM  Result Value Ref Range Status   MRSA by PCR NEGATIVE NEGATIVE Final    Comment:        The GeneXpert MRSA Assay (FDA approved for NASAL specimens only), is one component of a comprehensive MRSA colonization surveillance program. It is not intended to diagnose MRSA infection nor to guide or monitor treatment for MRSA infections. Performed at [facility], [place] [address] [place], [place] [address]   Respiratory Panel by PCR     Status: None   Collection Time: 12/29/17 11:15 AM  Result Value Ref Range Status   Adenovirus NOT DETECTED NOT DETECTED Final   Coronavirus 229E NOT DETECTED NOT DETECTED Final   Coronavirus HKU1 NOT DETECTED NOT DETECTED Final   Coronavirus NL63 NOT DETECTED NOT DETECTED Final   Coronavirus OC43 NOT DETECTED NOT DETECTED Final   Metapneumovirus NOT DETECTED NOT DETECTED Final   Rhinovirus / Enterovirus NOT DETECTED NOT DETECTED Final   Influenza A NOT DETECTED NOT DETECTED Final   Influenza B NOT DETECTED NOT DETECTED Final   Parainfluenza Virus 1 NOT DETECTED NOT DETECTED Final   Parainfluenza Virus 2 NOT DETECTED NOT DETECTED Final   Parainfluenza Virus 3 NOT DETECTED NOT DETECTED Final   Parainfluenza Virus 4 NOT DETECTED NOT DETECTED Final   Respiratory Syncytial Virus NOT DETECTED NOT DETECTED Final    Bordetella pertussis NOT DETECTED NOT DETECTED Final   Chlamydophila pneumoniae NOT DETECTED NOT DETECTED Final   Mycoplasma pneumoniae NOT DETECTED NOT DETECTED Final    Comment: Performed at [facility], [place] [address] [place], [place] [address]     Scheduled Meds: . aspirin  81 mg Oral Daily  . atorvastatin  20 mg Oral QPC breakfast  . azelastine  1 spray Each Nare BID  . fluticasone  1 spray Each Nare Daily  . heparin  5,000 Units Subcutaneous Q8H  . mouth rinse  15 mL Mouth Rinse BID  . metoprolol succinate  100 mg Oral Daily     LOS: 3 days   Cherene Altes, MD [facility] Office  [phone] Pager - Text Page per Shea Evans as per below:  On-Call/Text Page:      [email or web address]      password [email or web address]  If 7PM-7AM, please contact night-coverage [email or web address] Password [email or web address] 12/31/2017, 12:04 PM

## 2017-12-31 NOTE — Progress Notes (Signed)
PULMONARY / CRITICAL CARE MEDICINE   Name: Hannah Colon MRN: [id number] DOB: 1956/03/09    ADMISSION DATE:  12/28/2017 CONSULTATION DATE:  12/28/17  REFERRING MD:  Lorin Mercy - [facility]  CHIEF COMPLAINT:  SOB  HISTORY OF PRESENT ILLNESS:   Hannah Colon is a 62 y.o. F with PMH as outlined below including dCHF and HOCM.  She is scheduled to be seen at [facility] (scheduled to see Dr. Mina Marble end of April 2019) and is also followed by Dr. Stanford Breed as outpatient in [place].  In the past, he did not tolerate vigorous diuresis presumed due to decreased preload exacerbating LVOT gradient.  She saw Dr. Stanford Breed 3/27 for worsening dyspnea.  Symptoms had worsened despite increased lasix and toprol (though BP did not tolerate increase in toprol).  In the office, she was found to have SpO2 of 81%.  Given her hypoxia, decision was made to admit her for further evaluation.  Cardiology is attempting to contact Dr. Mina Marble at [facility] to inquire if she could be transferred there now versus wait until appointment in April. CXR was obtained and demonstrated increased interstitial markings with question of pneumonitis.  In the past, there was question of ILD; therefore, PCCM was called again in consultation for further recs.  She arrived to [facility] early AM hours 3/28.  Overnight at [facility], she received 41m lasix after she was found to have bilateral crackles.  Pt reports this did in fact help her breathing and this morning, she feels much more comfortable and breathing much easier. She does endorse cough for past 8 days, dry and non-productive.  Denies any chest pain, fevers/chills/sweats, myalgias.  No exposures to known sick contacts.  No recent travel.  CT scan from January 2019 showed diffuse ground glass opacities.  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 Walking desaturation test on 11/10/2017 185 feet x 3 laps on  ROOM AIR:  did not desaturate. Rest pulse ox was 99%, final pulse ox was 91%. HR response 76/min at rest to 84/min at peak exertion.  Plans were to follow up as outpatient in 2 months and at some point, assess HRCT of the chest both supine and prone.   SUBJECTIVE:   RHC 3/29 >no evidence of intracardiac shunting , relatively normal heart pressure/CO .  Says sob is better. + cough . O2 demands decreased, O2 sats adequate on 1l/m O2   VITAL SIGNS: BP 108/61   Pulse 79   Temp 98 F (36.7 C) (Oral)   Resp 20   Ht 5' (1.524 m)   Wt 120 lb 9.6 oz (54.7 kg)   SpO2 96%   BMI 23.55 kg/m   HEMODYNAMICS:    VENTILATOR SETTINGS:    INTAKE / OUTPUT: I/O last 3 completed shifts: In: 1215 [P.O.:960; I.V.:255] Out: 2750 [Urine:2750]  PHYSICAL EXAMINATION: General: NAD, sitting up in bed  Neuro: a/o x 3 , no focal deficits noted.  HEENT: [place]/AT  Cardiovascular:RRR /3/6 SM  Lungs: BB crackles , no wob  Abdomen: BS+ X 4 Q , soft and nt  Musculoskeletal:intact  maew  Skin: intact no rash    LABS:  BMET Recent Labs  Lab 12/29/17 0443 12/30/17 0227 12/31/17 0301  NA 136 133* 136  K 3.7 3.6 4.0  CL 102 100* 102  CO2 _0 BUN _1 CREATININE 0.66 0.69 0.61  GLUCOSE 104* 110* 108*    Electrolytes  Recent Labs  Lab 12/29/17 0443 12/30/17 0227 12/31/17 0301  CALCIUM 9.2 9.2 9.3    CBC Recent Labs  Lab 12/28/17 1123 12/30/17 0227  WBC 19.6* 12.4*  HGB 13.2 11.6*  HCT 39.5 36.4  PLT 411* 344    Coag's Recent Labs  Lab 12/30/17 0227  APTT 41*  INR 1.11    Sepsis Markers Recent Labs  Lab 12/28/17 1709  LATICACIDVEN 1.76    ABG Recent Labs  Lab 12/30/17 0900  PHART 7.416  PCO2ART 38.7  PO2ART 59.0*    Liver Enzymes Recent Labs  Lab 12/28/17 1123  AST 39  ALT 14  ALKPHOS 86  BILITOT 0.7  ALBUMIN 3.9    Cardiac Enzymes Recent Labs  Lab 12/28/17 1123 12/29/17 0443  TROPONINI <0.03 <0.03    Glucose No results for  input(s): GLUCAP in the last 168 hours.  Imaging No results found.   STUDIES:  CXR 3/27 > increased interstitial markings. CT chest 3/27 > diffuse bilateral airspace infiltrates.  CULTURES: Blood 3/27 >  ANTIBIOTICS: Levaquin 3/27 >   SIGNIFICANT EVENTS: 3/27 > admit.  LINES/TUBES: None.  DISCUSSION: 62 y.o. F with PMH including dCHF and HOCM (followed by Dr. Stanford Breed and scheduled to see Dr. Mina Marble at [facility] end of April).  Was thought to have component of ILD in the past as CT scan from January 2019 showed diffuse ground glass opacities.  Seen by Dr. 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  Plans were to obtain HRCT supine and prone in the near future. Now admitted 3/27 with dyspnea again.  Felt to be due to CHF / HOCM; however, non-contrast CT chest again showed diffuse bilateral airspace infiltrates; therefore, PCCM asked to see in consultation.   ASSESSMENT / PLAN:  Acute hypoxic respiratory failure - in part due to CHF exacerbation in setting of HOCM.  Right heart cath 3/29 shows grossly normal pressures, PAOP 10 -all reassuring Persistent bilateral groundglass pulmonary infiltrates, etiology unclear- has had negative autoimmune workup in the past     Plan: Pt for FOB w/ BAL and transbronchial biopsies on 4/1  O2 to keep sats >90%.  Will see again on 4/1   Tammy Parrett NP-C  [facility]  [phone]   12/31/2017

## 2017-12-31 NOTE — Progress Notes (Signed)
Subjective:   Still with cough. Hypoxia improving. Now on 1L O2 by . RHC yesterday with low filling pressures. No CP. Breathing improved,   RHC 3/29 RA = 2 RV = 37/4 PA = 37/13 (23) PCW = 10 Fick cardiac output/index = 6.1/4.0 PVR = 2.2 WU FA sat = 91% PA sat = 70%, 70% SVC sat = 64%  Objective:  Vitals:   12/31/17 0424 12/31/17 0739 12/31/17 0930 12/31/17 1112  BP:   108/61   Pulse:   79   Resp:      Temp: 98.2 F (36.8 C) 98 F (36.7 C)  97.7 F (36.5 C)  TempSrc: Oral Oral  Oral  SpO2:      Weight:      Height:        Intake/Output from previous day:  Intake/Output Summary (Last 24 hours) at 12/31/2017 1138 Last data filed at 12/31/2017 1048 Gross per 24 hour  Intake 960 ml  Output 2600 ml  Net -1640 ml    Physical Exam: General:  Sitting in chair Well appearing. + dry cough HEENT: normal Neck: supple. no JVD. Carotids 2+ bilat; no bruits. No lymphadenopathy or thryomegaly appreciated. Cor: PMI nondisplaced. Regular rate & rhythm. 2/6 SEM at RUSB s2 ok  Lungs: mild crackles throughout Abdomen: soft, nontender, nondistended. No hepatosplenomegaly. No bruits or masses. Good bowel sounds. Extremities: no cyanosis, clubbing, rash, edema Neuro: alert & orientedx3, cranial nerves grossly intact. moves all 4 extremities w/o difficulty. Affect pleasant   Lab Results: Basic Metabolic Panel: Recent Labs    12/30/17 0227 12/31/17 0301  NA 133* 136  K 3.6 4.0  CL 100* 102  CO2 24 23  GLUCOSE 110* 108*  BUN 15 13  CREATININE 0.69 0.61  CALCIUM 9.2 9.3   Liver Function Tests: No results for input(s): AST, ALT, ALKPHOS, BILITOT, PROT, ALBUMIN in the last 72 hours. No results for input(s): LIPASE, AMYLASE in the last 72 hours. CBC: Recent Labs    12/30/17 0227  WBC 12.4*  NEUTROABS 8.6*  HGB 11.6*  HCT 36.4  MCV 87.5  PLT 344   Cardiac Enzymes: Recent Labs    12/29/17 0443  TROPONINI <0.03    Imaging: No results found.  Cardiac  Studies:  ECG: SR LVH no acute changes    Telemetry: NSR no arrhythmia   Echo: Study Conclusions  - Left ventricle: There is marked SAM but dagger shaped LVOT   gradient only in the 2.5 m/sec range. The cavity size was normal.   Wall thickness was increased in a pattern of mild LVH. Systolic   function was vigorous. The estimated ejection fraction was in the   range of 65% to 70%. - Aortic valve: There appears to be some degree of fixed AS likely   in the mild to moderate range Morphologically not severe. Valve   area (VTI): 0.54 cm^2. Valve area (Vmax): 0.67 cm^2. Valve area   (Vmean): 0.62 cm^2. - Mitral valve: Eccentric MR looks mild to moderate in most views 2   chamber moderate overall Severely calcified annulus. Valve area   by continuity equation (using LVOT flow): 1.21 cm^2. - Atrial septum: No defect or patent foramen ovale was identified.  Medications:   . aspirin  81 mg Oral Daily  . atorvastatin  20 mg Oral QPC breakfast  . azelastine  1 spray Each Nare BID  . fluticasone  1 spray Each Nare Daily  . heparin  5,000 Units Subcutaneous Q8H  .  mouth rinse  15 mL Mouth Rinse BID  . metoprolol succinate  100 mg Oral Daily       Assessment/Plan:  1. Acute hypoxic respiratory failure - Hi-res CT with diffuse BILATERAL airspace infiltrates throughout both lungs with a slight upper lobe predominance - not much improvement with diuresis. Concern for ILD - sats improved. RHC yesterday reassuring.  - for bronch with BAL and biopsies 4/1 - P/CCM managing   2. HOCM:   - Reviewed by Dr. Johnsie Cancel carefully. Severe SAM but gradients hard to decipher from MR signal do not appear super high MR moderate with no acute chord rupture.  - Continue b-blocker  3. Mild to moderate aortic stenosis - stable  Stable from cardiac standpoint. We will sign off. Please call with questions. D/w Dr. Cleda Mccreedy Acheron Sugg 12/31/2017, 11:38 AM

## 2018-01-01 DIAGNOSIS — I10 Essential (primary) hypertension: Secondary | ICD-10-CM

## 2018-01-01 DIAGNOSIS — E785 Hyperlipidemia, unspecified: Secondary | ICD-10-CM

## 2018-01-01 LAB — BASIC METABOLIC PANEL
ANION GAP: 7 (ref 5–15)
BUN: 13 mg/dL (ref 6–20)
CO2: 27 mmol/L (ref 22–32)
Calcium: 9.5 mg/dL (ref 8.9–10.3)
Chloride: 101 mmol/L (ref 101–111)
Creatinine, Ser: 0.71 mg/dL (ref 0.44–1.00)
GFR calc Af Amer: >60 mL/min (ref >60)
Glucose, Bld: 98 mg/dL (ref 65–99)
Potassium: 4 mmol/L (ref 3.5–5.1)
SODIUM: 135 mmol/L (ref 135–145)

## 2018-01-01 NOTE — Progress Notes (Signed)
PROGRESS NOTE    Hannah Colon  UEA:[id number] DOB: 11-Apr-1956 DOA: 12/28/2017 PCP: Elisabeth Cara, PA-C   Brief Narrative: Hannah Colon is a 62 y.o. F with a Hx of dCHF and HOCM who saw Dr. Stanford Breed 3/27 for worsening dyspnea. Symptoms worsened despite increased lasix.  In the office, she was found to have SpO2 of 81%.  A CXR demonstrated increased interstitial markings with question of pneumonitis / ILD. She has been treated with steroids for pneumonitis/ILD   Assessment & Plan:   Principal Problem:   Acute on chronic respiratory failure with hypoxia (HCC) Active Problems:   Essential hypertension   Hyperlipidemia   Hyperglycemia   Heart murmur   Acute on chronic diastolic congestive heart failure (HCC)   ILD (interstitial lung disease) (HCC)   Mold exposure   Chronic cough   Acute respiratory failure Bilateral ground glass pulmonary infiltrates. Improvement with diuresis and patient clinically appears improved but not at baseline. Still with desaturation to low-mid 80%s while at rest, although when ambulating, is able to be off of oxygen. -Pulmonology recommendations: FOB w/ BAL and biopsy planned for 4/1  HOCM RHC with low filling pressures. Patient diuresed which appears to have helped with clinical situation -Cardiology recommendations (signed off)  Mild/moderate aortic stenosis Mitral regurgitation Outpatient cardiology follow-up -Beta-blocker   DVT prophylaxis: Heparin Code Status:   Code Status: Full Code Family Communication: None at bedside Disposition Plan: Transfer to med-surg. Likely discharge in 24-48 hours   Consultants:   PCCM  Cardiology (signed off)  Procedures:   RHC (12/30/17) Assessment: 1. Relatively normal R/L heart pressures with normal PCWP and cardiac output 2. No evidence of intracardiac shunting  Antimicrobials:  Levaquin (3/27)    Subjective: No dyspnea or chest pain.  Objective: Vitals:   01/01/18 0400  01/01/18 0620 01/01/18 0844 01/01/18 1253  BP: 104/62     Pulse: 66 82    Resp: 16 19    Temp:   97.9 F (36.6 C) 98.4 F (36.9 C)  TempSrc:   Oral Oral  SpO2: 91% 91%    Weight:  55.5 kg (122 lb 5.7 oz)    Height:        Intake/Output Summary (Last 24 hours) at 01/01/2018 1322 Last data filed at 01/01/2018 1300 Gross per 24 hour  Intake 1620 ml  Output 2600 ml  Net -980 ml   Filed Weights   12/29/17 2200 12/31/17 1600 01/01/18 0620  Weight: 54.7 kg (120 lb 9.6 oz) 55.6 kg (122 lb 9.2 oz) 55.5 kg (122 lb 5.7 oz)    Examination:  General exam: Appears calm and comfortable Respiratory system: Mild rales most prominent at bilateral bases but with some scattered rales in middle of the lung. No wheezing. Respiratory effort normal. Cardiovascular system: S1 & S2 heard, RRR. No murmurs, rubs, gallops or clicks. Gastrointestinal system: Abdomen is nondistended, soft and nontender. No organomegaly or masses felt. Normal bowel sounds heard. Central nervous system: Alert and oriented. No focal neurological deficits. Extremities: No edema. No calf tenderness Skin: No cyanosis. No rashes Psychiatry: Judgement and insight appear normal. Mood & affect appropriate.     Data Reviewed: I have personally reviewed following labs and imaging studies  CBC: Recent Labs  Lab 12/28/17 1123 12/30/17 0227  WBC 19.6* 12.4*  NEUTROABS 17.2* 8.6*  HGB 13.2 11.6*  HCT 39.5 36.4  MCV 85.1 87.5  PLT 411* 478   Basic Metabolic Panel: Recent Labs  Lab 12/28/17 1123 12/29/17 0443 12/30/17 0227 12/31/17  0301 01/01/18 0255  NA 133* 136 133* 136 135  K 3.9 3.7 3.6 4.0 4.0  CL 101 102 100* 102 101  CO2 21* _0 GLUCOSE 117* 104* 110* 108* 98  BUN _1 CREATININE 0.75 0.66 0.69 0.61 0.71  CALCIUM 9.9 9.2 9.2 9.3 9.5   GFR: Estimated Creatinine Clearance: 58.4 mL/min (by C-G formula based on SCr of 0.71 mg/dL). Liver Function Tests: Recent Labs  Lab 12/28/17 1123    AST 39  ALT 14  ALKPHOS 86  BILITOT 0.7  PROT 8.4*  ALBUMIN 3.9   No results for input(s): LIPASE, AMYLASE in the last 168 hours. No results for input(s): AMMONIA in the last 168 hours. Coagulation Profile: Recent Labs  Lab 12/30/17 0227  INR 1.11   Cardiac Enzymes: Recent Labs  Lab 12/28/17 1123 12/29/17 0443  TROPONINI <0.03 <0.03   BNP (last 3 results) No results for input(s): PROBNP in the last 8760 hours. HbA1C: No results for input(s): HGBA1C in the last 72 hours. CBG: No results for input(s): GLUCAP in the last 168 hours. Lipid Profile: No results for input(s): CHOL, HDL, LDLCALC, TRIG, CHOLHDL, LDLDIRECT in the last 72 hours. Thyroid Function Tests: No results for input(s): TSH, T4TOTAL, FREET4, T3FREE, THYROIDAB in the last 72 hours. Anemia Panel: No results for input(s): VITAMINB12, FOLATE, FERRITIN, TIBC, IRON, RETICCTPCT in the last 72 hours. Sepsis Labs: Recent Labs  Lab 12/28/17 1709  LATICACIDVEN 1.76    Recent Results (from the past 240 hour(s))  Blood culture (routine x 2)     Status: None (Preliminary result)   Collection Time: 12/28/17  4:55 PM  Result Value Ref Range Status   Specimen Description   Final    BLOOD LEFT ANTECUBITAL Performed at [facility], [address] [place], [place] [address]    Special Requests   Final    BOTTLES DRAWN AEROBIC AND ANAEROBIC Blood Culture adequate volume Performed at [facility], [address] [place], [place] [address]    Culture   Final    NO GROWTH 4 DAYS Performed at [facility], [place] [address], [place], [place] [address]    Report Status PENDING  Incomplete  Blood culture (routine x 2)     Status: None (Preliminary result)   Collection Time: 12/28/17  5:00 PM  Result Value Ref Range Status   Specimen Description   Final    BLOOD RIGHT ANTECUBITAL Performed at [facility], [address] [place], [place] [address]    Special Requests   Final     BOTTLES DRAWN AEROBIC AND ANAEROBIC Blood Culture adequate volume Performed at [facility], [address] [place], [place] [address]    Culture   Final    NO GROWTH 4 DAYS Performed at [facility], [place] [address] [place], [place] [address]    Report Status PENDING  Incomplete  MRSA PCR Screening     Status: None   Collection Time: 12/29/17  6:54 AM  Result Value Ref Range Status   MRSA by PCR NEGATIVE NEGATIVE Final    Comment:        The GeneXpert MRSA Assay (FDA approved for NASAL specimens only), is one component of a comprehensive MRSA colonization surveillance program. It is not intended to diagnose MRSA infection nor to guide or monitor treatment for MRSA infections. Performed at [facility],  [address], [place], [place] [address]   Respiratory Panel by PCR     Status: None   Collection Time: 12/29/17 11:15 AM  Result Value Ref Range Status   Adenovirus NOT DETECTED NOT DETECTED Final   Coronavirus 229E NOT DETECTED NOT DETECTED Final   Coronavirus HKU1 NOT DETECTED NOT DETECTED Final   Coronavirus NL63 NOT DETECTED NOT DETECTED Final   Coronavirus OC43 NOT DETECTED NOT DETECTED Final   Metapneumovirus NOT DETECTED NOT DETECTED Final   Rhinovirus / Enterovirus NOT DETECTED NOT DETECTED Final   Influenza A NOT DETECTED NOT DETECTED Final   Influenza B NOT DETECTED NOT DETECTED Final   Parainfluenza Virus 1 NOT DETECTED NOT DETECTED Final   Parainfluenza Virus 2 NOT DETECTED NOT DETECTED Final   Parainfluenza Virus 3 NOT DETECTED NOT DETECTED Final   Parainfluenza Virus 4 NOT DETECTED NOT DETECTED Final   Respiratory Syncytial Virus NOT DETECTED NOT DETECTED Final   Bordetella pertussis NOT DETECTED NOT DETECTED Final   Chlamydophila pneumoniae NOT DETECTED NOT DETECTED Final   Mycoplasma pneumoniae NOT DETECTED NOT DETECTED Final    Comment: Performed at [facility], [place] [address], [place], [place] [address]          Radiology Studies: No results found.      Scheduled Meds: . atorvastatin  20 mg Oral QPC breakfast  . azelastine  1 spray Each Nare BID  . fluticasone  1 spray Each Nare Daily  . heparin  5,000 Units Subcutaneous Q8H  . mouth rinse  15 mL Mouth Rinse BID  . metoprolol succinate  100 mg Oral Daily  . sodium chloride flush  3 mL Intravenous Q12H   Continuous Infusions:   LOS: 4 days     Cordelia Poche, MD [facility] 01/01/2018, 1:22 PM Pager: [phone]  If 7PM-7AM, please contact night-coverage [email or web address] Password [email or web address] 01/01/2018, 1:22 PM

## 2018-01-01 NOTE — Progress Notes (Signed)
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   I therefore, left VM with bronch lab [phone] 01/01/2018 11:52 AM . I have instructed them to call Dr Vaughan Browner who will be [id number] rounder 01/02/18  AM (I am off 01/02/18) and arrange for bronch BAL and TTBx time for 01/02/18. I have d/w Dr MVaughan Brownerand he is willing to do the procedure sometime in the afternoon of 01/02/18    I have ordered NPO From 6am 01/02/18 and I d/w Dr BJeffie Pollockcards about clearance for bronchoscopy and he feels patient is very low risk for cardiac complications from moderate sedation and bronch biopsy  Will need to hold SQ  heparin after giving 2pm dose 01/01/2018   HAve  Ordered RN to walk patient and document exertional pulse ox and HR as part of risk evaluation    Dr. MBrand Males M.D., FBristol Myers Squibb Childrens HospitalC.P Pulmonary and Critical Care Medicine Staff Physician, CImbodenDirector - Interstitial Lung Disease  Program  [place]ulmonary [place] [address] Pager: [phone] If no answer or between  15:00h - 7:00h: call [phone] Telephone: [phone]

## 2018-01-02 ENCOUNTER — Inpatient Hospital Stay (HOSPITAL_COMMUNITY): Payer: 59

## 2018-01-02 ENCOUNTER — Encounter (HOSPITAL_COMMUNITY): Payer: Self-pay | Admitting: Internal Medicine

## 2018-01-02 ENCOUNTER — Encounter (HOSPITAL_COMMUNITY)
Admission: EM | Disposition: A | Discharge status: Home / Self Care | Payer: Self-pay | Source: Home / Self Care | Attending: Internal Medicine

## 2018-01-02 HISTORY — PX: VIDEO BRONCHOSCOPY: SHX5072

## 2018-01-02 LAB — CBC WITH DIFFERENTIAL/PLATELET
BASOS ABS: 0.1 10*3/uL (ref 0.0–0.1)
BASOS PCT: 1 %
Eosinophils Absolute: 0.6 10*3/uL (ref 0.0–0.7)
Eosinophils Relative: 8 %
HCT: 35.5 % — ABNORMAL LOW (ref 36.0–46.0)
HEMOGLOBIN: 11.5 g/dL — AB (ref 12.0–15.0)
Lymphocytes Relative: 27 %
Lymphs Abs: 1.8 10*3/uL (ref 0.7–4.0)
MCH: 28.6 pg (ref 26.0–34.0)
MCHC: 32.4 g/dL (ref 30.0–36.0)
MCV: 88.3 fL (ref 78.0–100.0)
MONOS PCT: 7 %
Monocytes Absolute: 0.5 10*3/uL (ref 0.1–1.0)
NEUTROS ABS: 3.7 10*3/uL (ref 1.7–7.7)
NEUTROS PCT: 57 %
Platelets: 366 10*3/uL (ref 150–400)
RBC: 4.02 MIL/uL (ref 3.87–5.11)
RDW: 14.1 % (ref 11.5–15.5)
WBC: 6.6 10*3/uL (ref 4.0–10.5)

## 2018-01-02 LAB — BASIC METABOLIC PANEL
ANION GAP: 10 (ref 5–15)
BUN: 12 mg/dL (ref 6–20)
CALCIUM: 9.2 mg/dL (ref 8.9–10.3)
CO2: 25 mmol/L (ref 22–32)
Chloride: 100 mmol/L — ABNORMAL LOW (ref 101–111)
Creatinine, Ser: 0.61 mg/dL (ref 0.44–1.00)
Glucose, Bld: 112 mg/dL — ABNORMAL HIGH (ref 65–99)
Potassium: 4 mmol/L (ref 3.5–5.1)
SODIUM: 135 mmol/L (ref 135–145)

## 2018-01-02 LAB — BODY FLUID CELL COUNT WITH DIFFERENTIAL
Eos, Fluid: 10 %
LYMPHS FL: 70 %
Monocyte-Macrophage-Serous Fluid: 6 % — ABNORMAL LOW (ref 50–90)
NEUTROPHIL FLUID: 14 % (ref 0–25)
WBC FLUID: 678 uL (ref 0–1000)

## 2018-01-02 LAB — CULTURE, BLOOD (ROUTINE X 2)
CULTURE: NO GROWTH
Culture: NO GROWTH
SPECIAL REQUESTS: ADEQUATE
Special Requests: ADEQUATE

## 2018-01-02 LAB — PROTIME-INR
INR: 1.07
PROTHROMBIN TIME: 13.8 s (ref 11.4–15.2)

## 2018-01-02 SURGERY — BRONCHOSCOPY, WITH FLUOROSCOPY
Anesthesia: Moderate Sedation | Laterality: Bilateral

## 2018-01-02 MED ORDER — SODIUM CHLORIDE 0.9 % IV SOLN
INTRAVENOUS | Status: DC
Start: 2018-01-02 — End: 2018-01-04
  Administered 2018-01-02: 14:00:00 via INTRAVENOUS

## 2018-01-02 MED ORDER — PHENYLEPHRINE HCL 0.25 % NA SOLN
NASAL | Status: DC | PRN
  Administered 2018-01-02: 2 via NASAL

## 2018-01-02 MED ORDER — FENTANYL CITRATE (PF) 100 MCG/2ML IJ SOLN
INTRAMUSCULAR | Status: DC | PRN
  Administered 2018-01-02 (×2): 25 ug via INTRAVENOUS

## 2018-01-02 MED ORDER — LIDOCAINE HCL 2 % EX GEL
CUTANEOUS | Status: DC | PRN
  Administered 2018-01-02: 1

## 2018-01-02 MED ORDER — LIDOCAINE HCL 2 % EX GEL: 1.0000 "application " | Freq: Once | CUTANEOUS | Status: DC

## 2018-01-02 MED ORDER — MIDAZOLAM HCL 5 MG/ML IJ SOLN
INTRAMUSCULAR | Status: AC
  Filled 2018-01-02: qty 2

## 2018-01-02 MED ORDER — MIDAZOLAM HCL 10 MG/2ML IJ SOLN
INTRAMUSCULAR | Status: DC | PRN
  Administered 2018-01-02 (×2): 1 mg via INTRAVENOUS

## 2018-01-02 MED ORDER — FENTANYL CITRATE (PF) 100 MCG/2ML IJ SOLN
INTRAMUSCULAR | Status: AC
  Filled 2018-01-02: qty 4

## 2018-01-02 MED ORDER — BUTAMBEN-TETRACAINE-BENZOCAINE 2-2-14 % EX AERO: 1.0000 | INHALATION_SPRAY | Freq: Once | CUTANEOUS | Status: DC

## 2018-01-02 MED ORDER — LIDOCAINE HCL (PF) 1 % IJ SOLN
INTRAMUSCULAR | Status: DC | PRN
  Administered 2018-01-02: 6 mL

## 2018-01-02 MED ORDER — PHENYLEPHRINE HCL 0.25 % NA SOLN: 1.0000 | Freq: Four times a day (QID) | NASAL | Status: DC | PRN

## 2018-01-02 NOTE — Progress Notes (Signed)
PROGRESS NOTE    Hannah Colon  NWG:[id number] DOB: 01-27-1956 DOA: 12/28/2017 PCP: Burnis MedinFulbright, Virginia E, PA-C   Brief Narrative: Hannah Colon is a 62 y.o. F withaHx ofdCHFandHOCMwhosaw Dr. Jens Somrenshaw 3/27 for worsening dyspnea. Symptoms worsened despite increased lasix.In the office, she was found to have SpO2 of 81%. A CXR demonstrated increased interstitial markings with question of pneumonitis/ILD.She has been treated with steroids for pneumonitis/ILD    Assessment & Plan:   Principal Problem:   Acute on chronic respiratory failure with hypoxia (HCC) Active Problems:   Essential hypertension   Hyperlipidemia   Hyperglycemia   Heart murmur   Acute on chronic diastolic congestive heart failure (HCC)   ILD (interstitial lung disease) (HCC)   Mold exposure   Chronic cough   1-Acute hypoxic Respiratory Failure. Bilateral ground glass pulmonary infiltrates.  Treated for HF initially. Improved with diuresis. Still hypoxic. Underwent cath with no increase pressure.  Plan for bronchoscopy today for lavage and biopsy.  Off lasix.   HOCM Continue with Beta-blocker;  Mild/Moderate Aortic Stenosis, Mitral regurgitation.  BB.   DVT prophylaxis: heparin on hold for bronchoscopy.  Code Status: full code.  Family Communication: care discussed with patient  Disposition Plan: home when stable.   Consultants:   Cardiology  Pulmonary    Procedures// Events.  3/27 admit 3/29 [facility] - no intracardiac shunt - relatively normal pressures/CO        Antimicrobials:   Levaquin (3/27) recieved one dose of Levaquin.      Subjective: She is doing well. No worsening dyspnea.  Awaiting bronchoscopy   Objective: Vitals:   01/02/18 0400 01/02/18 0600 01/02/18 0805 01/02/18 0808  BP: (!) 120/54  119/61 119/61  Pulse: 64   74  Resp: 14   18  Temp: 98.3 F (36.8 C)  98 F (36.7 C)   TempSrc: Oral  Oral   SpO2: 94%   97%  Weight:  55 kg (121 lb 4.1 oz)      Height:        Intake/Output Summary (Last 24 hours) at 01/02/2018 0902 Last data filed at 01/02/2018 0845 Gross per 24 hour  Intake 480 ml  Output 3100 ml  Net -2620 ml   Filed Weights   12/31/17 1600 01/01/18 0620 01/02/18 0600  Weight: 55.6 kg (122 lb 9.2 oz) 55.5 kg (122 lb 5.7 oz) 55 kg (121 lb 4.1 oz)    Examination:  General exam: Appears calm and comfortable  Respiratory system: BL crackles. Respiratory effort normal. Cardiovascular system: S1 & S2 heard, RRR. No JVD, murmurs, rubs, gallops or clicks. No pedal edema. Gastrointestinal system: Abdomen is nondistended, soft and nontender. No organomegaly or masses felt. Normal bowel sounds heard. Central nervous system: Alert and oriented. No focal neurological deficits. Extremities: Symmetric 5 x 5 power. Skin: No rashes, lesions or ulcers Psychiatry: Judgement and insight appear normal. Mood & affect appropriate.     Data Reviewed: I have personally reviewed following labs and imaging studies  CBC: Recent Labs  Lab 12/28/17 1123 12/30/17 0227 01/02/18 0116  WBC 19.6* 12.4* 6.6  NEUTROABS 17.2* 8.6* 3.7  HGB 13.2 11.6* 11.5*  HCT 39.5 36.4 35.5*  MCV 85.1 87.5 88.3  PLT 411* 344 366   Basic Metabolic Panel: Recent Labs  Lab 12/29/17 0443 12/30/17 0227 12/31/17 0301 01/01/18 0255 01/02/18 0116  NA 136 133* 136 135 135  K 3.7 3.6 4.0 4.0 4.0  CL 102 100* 102 101 100*  CO2 24 24 23 27  25  GLUCOSE 104* 110* 108* 98 112*  BUN 13 15 13 13 12   CREATININE 0.66 0.69 0.61 0.71 0.61  CALCIUM 9.2 9.2 9.3 9.5 9.2   GFR: Estimated Creatinine Clearance: 58.2 mL/min (by C-G formula based on SCr of 0.61 mg/dL). Liver Function Tests: Recent Labs  Lab 12/28/17 1123  AST 39  ALT 14  ALKPHOS 86  BILITOT 0.7  PROT 8.4*  ALBUMIN 3.9   No results for input(s): LIPASE, AMYLASE in the last 168 hours. No results for input(s): AMMONIA in the last 168 hours. Coagulation Profile: Recent Labs  Lab 12/30/17 0227  01/02/18 0116  INR 1.11 1.07   Cardiac Enzymes: Recent Labs  Lab 12/28/17 1123 12/29/17 0443  TROPONINI <0.03 <0.03   BNP (last 3 results) No results for input(s): PROBNP in the last 8760 hours. HbA1C: No results for input(s): HGBA1C in the last 72 hours. CBG: No results for input(s): GLUCAP in the last 168 hours. Lipid Profile: No results for input(s): CHOL, HDL, LDLCALC, TRIG, CHOLHDL, LDLDIRECT in the last 72 hours. Thyroid Function Tests: No results for input(s): TSH, T4TOTAL, FREET4, T3FREE, THYROIDAB in the last 72 hours. Anemia Panel: No results for input(s): VITAMINB12, FOLATE, FERRITIN, TIBC, IRON, RETICCTPCT in the last 72 hours. Sepsis Labs: Recent Labs  Lab 12/28/17 1709  LATICACIDVEN 1.76    Recent Results (from the past 240 hour(s))  Blood culture (routine x 2)     Status: None (Preliminary result)   Collection Time: 12/28/17  4:55 PM  Result Value Ref Range Status   Specimen Description   Final    BLOOD LEFT ANTECUBITAL Performed at [facility], [address] [place], [place] [address]    Special Requests   Final    BOTTLES DRAWN AEROBIC AND ANAEROBIC Blood Culture adequate volume Performed at [facility], [address] [place], [place] [address]    Culture   Final    NO GROWTH 4 DAYS Performed at [facility], [address], [place], [place] [address]    Report Status PENDING  Incomplete  Blood culture (routine x 2)     Status: None (Preliminary result)   Collection Time: 12/28/17  5:00 PM  Result Value Ref Range Status   Specimen Description   Final    BLOOD RIGHT ANTECUBITAL Performed at [facility], [address] [place], [place] [address]    Special Requests   Final    BOTTLES DRAWN AEROBIC AND ANAEROBIC Blood Culture adequate volume Performed at [facility], [address] [place], [place] [address]    Culture   Final    NO GROWTH 4 DAYS Performed at [facility],  [address], [place], [place] [address]    Report Status PENDING  Incomplete  MRSA PCR Screening     Status: None   Collection Time: 12/29/17  6:54 AM  Result Value Ref Range Status   MRSA by PCR NEGATIVE NEGATIVE Final    Comment:        The GeneXpert MRSA Assay (FDA approved for NASAL specimens only), is one component of a comprehensive MRSA colonization surveillance program. It is not intended to diagnose MRSA infection nor to guide or monitor treatment for MRSA infections. Performed at [facility], [address], [place], [place] [address]   Respiratory Panel by PCR     Status: None   Collection Time: 12/29/17 11:15 AM  Result Value Ref  Range Status   Adenovirus NOT DETECTED NOT DETECTED Final   Coronavirus 229E NOT DETECTED NOT DETECTED Final   Coronavirus HKU1 NOT DETECTED NOT DETECTED Final   Coronavirus NL63 NOT DETECTED NOT DETECTED Final   Coronavirus OC43 NOT DETECTED NOT DETECTED Final   Metapneumovirus NOT DETECTED NOT DETECTED Final   Rhinovirus / Enterovirus NOT DETECTED NOT DETECTED Final   Influenza A NOT DETECTED NOT DETECTED Final   Influenza B NOT DETECTED NOT DETECTED Final   Parainfluenza Virus 1 NOT DETECTED NOT DETECTED Final   Parainfluenza Virus 2 NOT DETECTED NOT DETECTED Final   Parainfluenza Virus 3 NOT DETECTED NOT DETECTED Final   Parainfluenza Virus 4 NOT DETECTED NOT DETECTED Final   Respiratory Syncytial Virus NOT DETECTED NOT DETECTED Final   Bordetella pertussis NOT DETECTED NOT DETECTED Final   Chlamydophila pneumoniae NOT DETECTED NOT DETECTED Final   Mycoplasma pneumoniae NOT DETECTED NOT DETECTED Final    Comment: Performed at [facility], [address] [place], [place] [address]         Radiology Studies: No results found.      Scheduled Meds: . atorvastatin  20 mg Oral QPC breakfast  . azelastine  1 spray Each Nare BID  . fluticasone  1 spray Each Nare Daily  . mouth rinse  15 mL Mouth Rinse BID  .  metoprolol succinate  100 mg Oral Daily  . sodium chloride flush  3 mL Intravenous Q12H   Continuous Infusions:   LOS: 5 days    Time spent: 35 minutes.     Alba Cory, MD [facility] Pager [phone]  If 7PM-7AM, please contact night-coverage [email or web address] Password [email or web address] 01/02/2018, 9:02 AM

## 2018-01-02 NOTE — Progress Notes (Addendum)
PULMONARY / CRITICAL CARE MEDICINE   Name: Hannah Colon MRN: [id number] DOB: Nov 19, 1955    ADMISSION DATE:  12/28/2017 CONSULTATION DATE:  12/28/17  REFERRING MD:  Lorin Mercy - [facility]  CHIEF COMPLAINT:  SOB  HISTORY OF PRESENT ILLNESS:   Hannah Colon is a 62 y.o. F with PMH as outlined below including dCHF and HOCM.  She is scheduled to be seen at [facility] (scheduled to see Dr. Mina Marble end of April 2019) and is also followed by Dr. Stanford Breed as outpatient in [place].  In the past, he did not tolerate vigorous diuresis presumed due to decreased preload exacerbating LVOT gradient.  She saw Dr. Stanford Breed 3/27 for worsening dyspnea.  Symptoms had worsened despite increased lasix and toprol (though BP did not tolerate increase in toprol).  In the office, she was found to have SpO2 of 81%.  Given her hypoxia, decision was made to admit her for further evaluation.  Cardiology is attempting to contact Dr. Mina Marble at [facility] to inquire if she could be transferred there now versus wait until appointment in April. CXR was obtained and demonstrated increased interstitial markings with question of pneumonitis.  In the past, there was question of ILD; therefore, PCCM was called again in consultation for further recs.  She arrived to [facility] early AM hours 3/28.  Overnight at [facility], she received 83m lasix after she was found to have bilateral crackles.  Pt reports this did in fact help her breathing and this morning, she feels much more comfortable and breathing much easier. She does endorse cough for past 8 days, dry and non-productive.  Denies any chest pain, fevers/chills/sweats, myalgias.  No exposures to known sick contacts.  No recent travel.  CT scan from January 2019 showed diffuse ground glass opacities.  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 Walking desaturation test on 11/10/2017 185 feet x 3 laps on  ROOM AIR:  did not desaturate. Rest pulse ox was 99%, final pulse ox was 91%. HR response 76/min at rest to 84/min at peak exertion.  Plans were to follow up as outpatient in 2 months and at some point, assess HRCT of the chest both supine and prone.   [place] >no evidence of intracardiac shunting , relatively normal heart pressure/CO .   SUBJECTIVE:   States that dyspnea is slighlty better.  Still has some cough.  Risk benefits of bronchoscopy discussed with the patient and she is willing to proceed.  VITAL SIGNS: BP 124/71 (BP Location: Left Arm)   Pulse 69   Temp 97.8 F (36.6 C) (Oral)   Resp 17   Ht 5' (1.524 m)   Wt 121 lb 4.1 oz (55 kg)   SpO2 97%   BMI 23.68 kg/m   HEMODYNAMICS:    VENTILATOR SETTINGS:    INTAKE / OUTPUT: I/O last 3 completed shifts: In: 1980 [P.O.:1980] Out: 3700 [Urine:3700]  PHYSICAL EXAMINATION: Gen:      No acute distress HEENT:  EOMI, sclera anicteric Neck:     No masses; no thyromegaly Lungs:    Bilateral basilar crackles; normal respiratory effort CV:         Regular rate and rhythm; no murmurs Abd:      + bowel sounds; soft, non-tender; no palpable masses, no distension Ext:    No edema; adequate peripheral perfusion Skin:      Warm and dry; no rash Neuro: alert and oriented x 3 Psych:  normal mood and affect  LABS:  BMET Recent Labs  Lab 12/31/17 0301 01/01/18 0255 01/02/18 0116  NA 136 135 135  K 4.0 4.0 4.0  CL 102 101 100*  CO2 _0 BUN _1 CREATININE 0.61 0.71 0.61  GLUCOSE 108* 98 112*    Electrolytes Recent Labs  Lab 12/31/17 0301 01/01/18 0255 01/02/18 0116  CALCIUM 9.3 9.5 9.2    CBC Recent Labs  Lab 12/28/17 1123 12/30/17 0227 01/02/18 0116  WBC 19.6* 12.4* 6.6  HGB 13.2 11.6* 11.5*  HCT 39.5 36.4 35.5*  PLT 411* 344 366    Coag's Recent Labs  Lab 12/30/17 0227 01/02/18 0116  APTT 41*  --   INR 1.11 1.07    Sepsis Markers Recent Labs  Lab 12/28/17 1709  LATICACIDVEN  1.76    ABG Recent Labs  Lab 12/30/17 0900  PHART 7.416  PCO2ART 38.7  PO2ART 59.0*    Liver Enzymes Recent Labs  Lab 12/28/17 1123  AST 39  ALT 14  ALKPHOS 86  BILITOT 0.7  ALBUMIN 3.9    Cardiac Enzymes Recent Labs  Lab 12/28/17 1123 12/29/17 0443  TROPONINI <0.03 <0.03    Glucose No results for input(s): GLUCAP in the last 168 hours.  Imaging No results found.   STUDIES:  CXR 3/27 > increased interstitial markings. CT chest 3/27 > diffuse bilateral airspace infiltrates, mild at trapping with upper lobe predominance I have reviewed the images personally   CULTURES: Blood 3/27 >  ANTIBIOTICS: Levaquin 3/27  SIGNIFICANT EVENTS: 3/27 > admit.  LINES/TUBES: None.  DISCUSSION: 63 y.o. F with PMH including dCHF and HOCM (followed by Dr. Stanford Breed and scheduled to see Dr. Mina Marble at [facility] end of April).  Was thought to have component of ILD in the past as CT scan from January 2019 showed diffuse ground glass opacities.  Seen by Dr. 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  Plans were to obtain HRCT supine and prone in the near future. Now admitted 3/27 with dyspnea again.  Felt to be due to CHF / HOCM; however, non-contrast CT chest again showed diffuse bilateral airspace infiltrates; therefore, PCCM asked to see in consultation.  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  Plan: Scheduled for bronchoscopy with BAL and possible transbronchial biopsy today at 2 PM Potential complications, including bleeding, lung collapse, cardiac arrest, and risk benefit discussed with the patient and she is willing to proceed  Marshell Garfinkel MD [facility] Pager  [phone] If no answer or after 3pm call: [phone] 01/02/2018, 12:27 PM

## 2018-01-02 NOTE — Progress Notes (Signed)
Video bronchoscopy performed Intervention bronchial washings Intervention bronchial biopsy Pt tolerated well  Daveah Varone David RRT  

## 2018-01-02 NOTE — Op Note (Signed)
[facility] Cardiopulmonary Patient Name: Hannah Colon Pocedure Date: 01/02/2018 MRN: [id number] Attending MD: Marshell Garfinkel , MD Date of Birth: [birth year] CSN: Finalized Age: 62 Admit Type: Inpatient Gender: Female Procedure:            Bronchoscopy Indications:          Interstitial lung disease Providers:            Marshell Garfinkel, MD, Ashley Mariner RRT,RCP, Cherre Huger                        RRT, RCP Referring MD:          Medicines:            Midazolam 2 mg IV, Fentanyl 149 mcg IV Complications:        No immediate complications Estimated Blood Loss: Estimated blood loss: none. Procedure:            Pre-Anesthesia Assessment:                       - A History and Physical has been performed. Patient                        meds and allergies have been reviewed. The risks and                        benefits of the procedure and the sedation options and                        risks were discussed with the patient. All questions                        were answered and informed consent was obtained.                        Patient identification and proposed procedure were                        verified prior to the procedure by the physician in the                        procedure room. Mental Status Examination: alert and                        oriented. Airway Examination: normal oropharyngeal                        airway. Respiratory Examination: clear to auscultation.                        CV Examination: RRR, no murmurs, no S3 or S4. ASA Grade                        Assessment: II - A patient with mild systemic disease.                        After reviewing the risks and benefits, the patient was                        deemed in satisfactory condition to undergo the  procedure. The anesthesia plan was to use moderate                        sedation / analgesia (conscious sedation). Immediately                        prior to  administration of medications, the patient was                        re-assessed for adequacy to receive sedatives. The                        heart rate, respiratory rate, oxygen saturations, blood                        pressure, adequacy of pulmonary ventilation, and                        response to care were monitored throughout the                        procedure. The physical status of the patient was                        re-assessed after the procedure.                       After obtaining informed consent, the bronchoscope was                        passed under direct vision. Throughout the procedure,                        the patient's blood pressure, pulse, and oxygen                        saturations were monitored continuously. the [id number]                        [id number] scope was introduced through the left nostril                        and advanced to the tracheobronchial tree. Scope In: 2:22:20 PM Scope Out: 2:35:07 PM Findings:      Bronchoalveolar lavage was performed in the RUL anterior segment (B3) of       the lung and sent for cell count, bacterial culture, viral smears &       culture, and fungal & AFB analysis and cytology and flow cytometry. 180       mL of fluid were instilled. 110 mL were returned. The return was cloudy.       There were no mucoid plugs in the return fluid. Multiple specimens were       obtained and pooled into one specimen, which was sent for analysis.      Transbronchial biopsies of an area of infiltration were performed in the       posterior segment of the right upper lobe, in the anterior segment of       the right upper lobe, in the lateral segment of the right middle lobe       and in  the medial segment of the right middle lobe using alligator       forceps and sent for histopathology examination. The procedure was       guided by fluoroscopy. Biopsy of lung tissue was obtained. Six biopsy       passes were performed. Six biopsy  samples were obtained. Impression:           - Interstitial lung disease                       - Bronchoalveolar lavage was performed.                       - Transbronchial lung biopsies were performed. Moderate Sedation:      Moderate (conscious) sedation was administered by the endoscopy nurse       and supervised by the endoscopist. The following parameters were       monitored: oxygen saturation, heart rate, blood pressure, respiratory       rate, EKG, adequacy of pulmonary ventilation, and response to care.       Total physician intraservice time was 25 minutes. Recommendation:       - Await BAL and biopsy results                       . Procedure Code(s):    --- Professional ---                       [phone], Bronchoscopy, rigid or flexible, including                        fluoroscopic guidance, when performed; with                        transbronchial lung biopsy(s), single lobe                       11941, Bronchoscopy, rigid or flexible, including                        fluoroscopic guidance, when performed; with bronchial                        alveolar lavage                       [phone], Bronchoscopy, rigid or flexible, including                        fluoroscopic guidance, when performed; with                        transbronchial lung biopsy(s), each additional lobe                        (List separately in addition to code for primary                        procedure)                       99152, Moderate sedation services provided by the same  physician or other qualified health care professional                        performing the diagnostic or therapeutic service that                        the sedation supports, requiring the presence of an                        independent trained observer to assist in the                        monitoring of the patient's level of consciousness and                        physiological status; initial 15  minutes of                        intraservice time, patient age 42 years or older                       [phone], Moderate sedation services; each additional 15                        minutes intraservice time Diagnosis Code(s):    --- Professional ---                       J84.9, Interstitial pulmonary disease, unspecified CPT copyright 2016 [employer] Medical [employer]. All rights reserved. The codes documented in this report are preliminary and upon coder review may  be revised to meet current compliance requirements. Marshell Garfinkel, MD 01/02/2018 2:49:11 PM Number of Addenda: 0

## 2018-01-03 ENCOUNTER — Telehealth: Payer: Self-pay | Admitting: Cardiology

## 2018-01-03 LAB — CBC WITH DIFFERENTIAL/PLATELET
BASOS ABS: 0.1 10*3/uL (ref 0.0–0.1)
BASOS PCT: 1 %
Eosinophils Absolute: 0.4 10*3/uL (ref 0.0–0.7)
Eosinophils Relative: 6 %
HEMATOCRIT: 36 % (ref 36.0–46.0)
HEMOGLOBIN: 11.5 g/dL — AB (ref 12.0–15.0)
Lymphocytes Relative: 28 %
Lymphs Abs: 1.8 10*3/uL (ref 0.7–4.0)
MCH: 28.3 pg (ref 26.0–34.0)
MCHC: 31.9 g/dL (ref 30.0–36.0)
MCV: 88.7 fL (ref 78.0–100.0)
MONO ABS: 0.5 10*3/uL (ref 0.1–1.0)
Monocytes Relative: 8 %
NEUTROS ABS: 3.8 10*3/uL (ref 1.7–7.7)
NEUTROS PCT: 57 %
Platelets: 396 10*3/uL (ref 150–400)
RBC: 4.06 MIL/uL (ref 3.87–5.11)
RDW: 14.3 % (ref 11.5–15.5)
WBC: 6.5 10*3/uL (ref 4.0–10.5)

## 2018-01-03 LAB — BASIC METABOLIC PANEL
ANION GAP: 10 (ref 5–15)
BUN: 13 mg/dL (ref 6–20)
CALCIUM: 9.3 mg/dL (ref 8.9–10.3)
CHLORIDE: 100 mmol/L — AB (ref 101–111)
CO2: 27 mmol/L (ref 22–32)
Creatinine, Ser: 0.63 mg/dL (ref 0.44–1.00)
GFR calc non Af Amer: >60 mL/min (ref >60)
GLUCOSE: 99 mg/dL (ref 65–99)
POTASSIUM: 4.3 mmol/L (ref 3.5–5.1)
Sodium: 137 mmol/L (ref 135–145)

## 2018-01-03 LAB — ACID FAST SMEAR (AFB): ACID FAST SMEAR - AFSCU2: NEGATIVE

## 2018-01-03 LAB — ACID FAST SMEAR (AFB, MYCOBACTERIA)

## 2018-01-03 MED ORDER — ASPIRIN EC 81 MG PO TBEC
81.0000 mg | DELAYED_RELEASE_TABLET | Freq: Every day | ORAL | Status: DC
  Administered 2018-01-03 – 2018-01-04 (×2): 81 mg via ORAL
  Filled 2018-01-03 (×2): qty 1

## 2018-01-03 MED ORDER — PREDNISONE 20 MG PO TABS
40.0000 mg | ORAL_TABLET | Freq: Every day | ORAL | Status: DC
  Administered 2018-01-03 – 2018-01-04 (×2): 40 mg via ORAL
  Filled 2018-01-03 (×2): qty 2

## 2018-01-03 MED ORDER — FUROSEMIDE 20 MG PO TABS: 20.0000 mg | ORAL_TABLET | Freq: Two times a day (BID) | ORAL | Status: DC

## 2018-01-03 NOTE — Telephone Encounter (Signed)
New Message    Patient is currently admitted to the hospital. She just spoke with pulmonology and wants to discuss what was advised with cardiology. Please call to discuss.

## 2018-01-03 NOTE — Progress Notes (Addendum)
PULMONARY / CRITICAL CARE MEDICINE   Name: Hannah Colon MRN: [id number] DOB: 1956-05-16    ADMISSION DATE:  12/28/2017 CONSULTATION DATE:  12/28/17  REFERRING MD:  Lorin Mercy - [facility]  CHIEF COMPLAINT:  SOB  HISTORY OF PRESENT ILLNESS:   Hannah Colon is a 62 y.o. F with PMH as outlined below including dCHF and HOCM.  She is scheduled to be seen at [facility] (scheduled to see Dr. Mina Marble end of April 2019) and is also followed by Dr. Stanford Breed as outpatient in [place].  In the past, he did not tolerate vigorous diuresis presumed due to decreased preload exacerbating LVOT gradient.  She saw Dr. Stanford Breed 3/27 for worsening dyspnea.  Symptoms had worsened despite increased lasix and toprol (though BP did not tolerate increase in toprol).  In the office, she was found to have SpO2 of 81%.  Given her hypoxia, decision was made to admit her for further evaluation.  Cardiology is attempting to contact Dr. Mina Marble at [facility] to inquire if she could be transferred there now versus wait until appointment in April. CXR was obtained and demonstrated increased interstitial markings with question of pneumonitis.  In the past, there was question of ILD; therefore, PCCM was called again in consultation for further recs.  She arrived to [facility] early AM hours 3/28.  Overnight at [facility], she received 33m lasix after she was found to have bilateral crackles.  Pt reports this did in fact help her breathing and this morning, she feels much more comfortable and breathing much easier. She does endorse cough for past 8 days, dry and non-productive.  Denies any chest pain, fevers/chills/sweats, myalgias.  No exposures to known sick contacts.  No recent travel.  CT scan from January 2019 showed diffuse ground glass opacities.  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 Walking desaturation test on 11/10/2017 185 feet x 3 laps on  ROOM AIR:  did not desaturate. Rest pulse ox was 99%, final pulse ox was 91%. HR response 76/min at rest to 84/min at peak exertion.  Plans were to follow up as outpatient in 2 months and at some point, assess HRCT of the chest both supine and prone.   RThatcher3/29 >no evidence of intracardiac shunting , relatively normal heart pressure/CO .   SUBJECTIVE:   States that dyspnea is slighlty better.  Still has some cough.  Risk benefits of bronchoscopy discussed with the patient and she is willing to proceed.  VITAL SIGNS: BP 129/68 (BP Location: Right Arm)   Pulse 75   Temp 97.6 F (36.4 C) (Oral)   Resp 18   Ht 5' (1.524 m)   Wt 124 lb 5.4 oz (56.4 kg)   SpO2 99%   BMI 24.28 kg/m   HEMODYNAMICS:    VENTILATOR SETTINGS:    INTAKE / OUTPUT: I/O last 3 completed shifts: In: 340 [P.O.:240; I.V.:100] Out: 5650 [Urine:5650]  PHYSICAL EXAMINATION: Gen:      No acute distress HEENT:  EOMI, sclera anicteric Neck:     No masses; no thyromegaly Lungs:    Bilateral basilar crackles; normal respiratory effort CV:         Regular rate and rhythm; no murmurs Abd:      + bowel sounds; soft, non-tender; no palpable masses, no distension Ext:    No edema; adequate peripheral perfusion Skin:      Warm and dry; no rash Neuro: alert and oriented x 3  Psych: normal mood and affect  LABS:  BMET Recent Labs  Lab 01/01/18 0255 01/02/18 0116 01/03/18 0432  NA 135 135 137  K 4.0 4.0 4.3  CL 101 100* 100*  CO2 _0 BUN _1 CREATININE 0.71 0.61 0.63  GLUCOSE 98 112* 99    Electrolytes Recent Labs  Lab 01/01/18 0255 01/02/18 0116 01/03/18 0432  CALCIUM 9.5 9.2 9.3    CBC Recent Labs  Lab 12/30/17 0227 01/02/18 0116 01/03/18 0432  WBC 12.4* 6.6 6.5  HGB 11.6* 11.5* 11.5*  HCT 36.4 35.5* 36.0  PLT 344 366 396    Coag's Recent Labs  Lab 12/30/17 0227 01/02/18 0116  APTT 41*  --   INR 1.11 1.07    Sepsis Markers Recent Labs  Lab 12/28/17 1709   LATICACIDVEN 1.76    ABG Recent Labs  Lab 12/30/17 0900  PHART 7.416  PCO2ART 38.7  PO2ART 59.0*    Liver Enzymes Recent Labs  Lab 12/28/17 1123  AST 39  ALT 14  ALKPHOS 86  BILITOT 0.7  ALBUMIN 3.9    Cardiac Enzymes Recent Labs  Lab 12/28/17 1123 12/29/17 0443  TROPONINI <0.03 <0.03    Glucose No results for input(s): GLUCAP in the last 168 hours.  Imaging No results found.   STUDIES:  CXR 3/27 > increased interstitial markings. CT chest 3/27 > diffuse bilateral airspace infiltrates, mild at trapping with upper lobe predominance CXR post bronch 4/1> Increase in RUL diffuse opacities, no pneumothorax I have reviewed the images personally.  Bronch 01/02/18 BAL- Negative cultures to date Cell count WBC 678, 70% lymphs, 10% eos CD4: CD8 ratio 2:1 Transbronchial biopsy- focal fibrosis and inflammation   CULTURES: Blood 3/27 >  ANTIBIOTICS: Levaquin 3/27  SIGNIFICANT EVENTS: 3/27 > admit.  LINES/TUBES: None.  DISCUSSION: 62 y.o. F with PMH including dCHF and HOCM (followed by Dr. Stanford Breed and scheduled to see Dr. Mina Marble at [facility] end of April).  Was thought to have component of ILD in the past as CT scan from January 2019 showed diffuse ground glass opacities.  Seen by Dr. 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  Plans were to obtain HRCT supine and prone in the near future. Now admitted 3/27 with dyspnea again.  Felt to be due to CHF / HOCM; however, non-contrast CT chest again showed diffuse bilateral airspace infiltrates; therefore, PCCM asked to see in consultation.  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 Keep at this dose till she can be reassessed in pulmonary clinic Follow up in 2 weeks with CXR and PFTs  PCCM will be available as needed during this admission. Discussed with the patient and Dr. Tyrell Antonio.  Marshell Garfinkel MD [facility] Pager [phone] If no answer or after 3pm call: [phone] 01/03/2018, 4:37 PM

## 2018-01-03 NOTE — Telephone Encounter (Signed)
Spoke with pt, she wanted to let us know the pulmonary doctor just saw her and she wanted to make us aware of his recommendations. Patient aware we will be able to see their notes. Will make dr Jens Somcrenshaw aware.

## 2018-01-03 NOTE — Progress Notes (Addendum)
PROGRESS NOTE    Hannah Colon  HKV:[id number] DOB: 1956-08-31 DOA: 12/28/2017 PCP: Elisabeth Cara, PA-C   Brief Narrative: Hannah Colon is a 62 y.o. F withaHx ofdCHFandHOCMwhosaw Dr. Stanford Breed 3/27 for worsening dyspnea. Symptoms worsened despite increased lasix.In the office, she was found to have SpO2 of 81%. A CXR demonstrated increased interstitial markings with question of pneumonitis/ILD.She has been treated with steroids for pneumonitis/ILD. She underwent cath, showed normal  pressure. She underwent Bronchoscopy 4-01, results pending.  Pulmonary following. Await their recommendation.     Assessment & Plan:   Principal Problem:   Acute on chronic respiratory failure with hypoxia (HCC) Active Problems:   Essential hypertension   Hyperlipidemia   Hyperglycemia   Heart murmur   Acute on chronic diastolic congestive heart failure (HCC)   ILD (interstitial lung disease) (HCC)   Mold exposure   Chronic cough   1-Acute hypoxic Respiratory Failure. -Bilateral ground glass pulmonary infiltrates.  -Treated for HF initially. Improved with diuresis. Still hypoxic. Underwent cath that showed no increase pressure.  -Patient underwent bronchoscopy 01-03-2018  for lavage and biopsy.  -Off lasix. Patient asking if she will need to be on lasix. I paged Dr Haroldine Laws, waiting call back.  -fungus, Pneumocystis smear pending.  -Await pulmonology recommendation post Bronchoscopy.   2-HOCM Continue with Beta-blocker She will need referral to [facility] for septal ablation or traditional HOCHM sx.  Needs to be careful with diuretics to avoid hypotension.   Mild/Moderate Aortic Stenosis, Mitral regurgitation.  BB.   DVT prophylaxis: heparin on hold for bronchoscopy.  Code Status: full code.  Family Communication: care discussed with patient  Disposition Plan: home when stable.   Consultants:   Cardiology  Pulmonary    Procedures// Events.  3/27 admit 3/29 [facility] -  no intracardiac shunt - relatively normal pressures/CO        Antimicrobials:   Levaquin (3/27) recieved one dose of Levaquin.      Subjective: She is doing well, denies dyspnea.   Objective: Vitals:   01/03/18 0500 01/03/18 0800 01/03/18 0946 01/03/18 1322  BP:  124/78  129/68  Pulse:  68 75 75  Resp:  16  18  Temp:  97.6 F (36.4 C)  97.6 F (36.4 C)  TempSrc:  Oral  Oral  SpO2:  99%  99%  Weight: 56.4 kg (124 lb 5.4 oz)     Height:        Intake/Output Summary (Last 24 hours) at 01/03/2018 1334 Last data filed at 01/03/2018 1215 Gross per 24 hour  Intake 1043 ml  Output 3550 ml  Net -2507 ml   Filed Weights   01/01/18 0620 01/02/18 0600 01/03/18 0500  Weight: 55.5 kg (122 lb 5.7 oz) 55 kg (121 lb 4.1 oz) 56.4 kg (124 lb 5.4 oz)    Examination:  General exam: NAD Respiratory system: Bilateral fine crackles, normal respiratory effort.  Cardiovascular system: S 1, S 2 RRR Gastrointestinal system: BS present, soft, nt Central nervous system: non focal.  Extremities: symmetric power.  Skin: no rash  Psychiatry: Mood and affect appropriate     Data Reviewed: I have personally reviewed following labs and imaging studies  CBC: Recent Labs  Lab 12/28/17 1123 12/30/17 0227 01/02/18 0116 01/03/18 0432  WBC 19.6* 12.4* 6.6 6.5  NEUTROABS 17.2* 8.6* 3.7 3.8  HGB 13.2 11.6* 11.5* 11.5*  HCT 39.5 36.4 35.5* 36.0  MCV 85.1 87.5 88.3 88.7  PLT 411* 344 366 564   Basic Metabolic Panel: Recent Labs  Lab 12/30/17 0227 12/31/17 0301 01/01/18 0255 01/02/18 0116 01/03/18 0432  NA 133* 136 135 135 137  K 3.6 4.0 4.0 4.0 4.3  CL 100* 102 101 100* 100*  CO2 _0 GLUCOSE 110* 108* 98 112* 99  BUN _1 CREATININE 0.69 0.61 0.71 0.61 0.63  CALCIUM 9.2 9.3 9.5 9.2 9.3   GFR: Estimated Creatinine Clearance: 58.9 mL/min (by C-G formula based on SCr of 0.63 mg/dL). Liver Function Tests: Recent Labs  Lab 12/28/17 1123  AST 39  ALT  14  ALKPHOS 86  BILITOT 0.7  PROT 8.4*  ALBUMIN 3.9   No results for input(s): LIPASE, AMYLASE in the last 168 hours. No results for input(s): AMMONIA in the last 168 hours. Coagulation Profile: Recent Labs  Lab 12/30/17 0227 01/02/18 0116  INR 1.11 1.07   Cardiac Enzymes: Recent Labs  Lab 12/28/17 1123 12/29/17 0443  TROPONINI <0.03 <0.03   BNP (last 3 results) No results for input(s): PROBNP in the last 8760 hours. HbA1C: No results for input(s): HGBA1C in the last 72 hours. CBG: No results for input(s): GLUCAP in the last 168 hours. Lipid Profile: No results for input(s): CHOL, HDL, LDLCALC, TRIG, CHOLHDL, LDLDIRECT in the last 72 hours. Thyroid Function Tests: No results for input(s): TSH, T4TOTAL, FREET4, T3FREE, THYROIDAB in the last 72 hours. Anemia Panel: No results for input(s): VITAMINB12, FOLATE, FERRITIN, TIBC, IRON, RETICCTPCT in the last 72 hours. Sepsis Labs: Recent Labs  Lab 12/28/17 1709  LATICACIDVEN 1.76    Recent Results (from the past 240 hour(s))  Blood culture (routine x 2)     Status: None   Collection Time: 12/28/17  4:55 PM  Result Value Ref Range Status   Specimen Description   Final    BLOOD LEFT ANTECUBITAL Performed at [facility], [address] [place], [place] [address]    Special Requests   Final    BOTTLES DRAWN AEROBIC AND ANAEROBIC Blood Culture adequate volume Performed at [facility], [address] [place], [place] [address]    Culture   Final    NO GROWTH 5 DAYS Performed at [facility], [place] [address], [place], [place] [address]    Report Status 01/02/2018 FINAL  Final  Blood culture (routine x 2)     Status: None   Collection Time: 12/28/17  5:00 PM  Result Value Ref Range Status   Specimen Description   Final    BLOOD RIGHT ANTECUBITAL Performed at [facility], [address] [place], [place] [address]    Special Requests   Final    BOTTLES DRAWN AEROBIC AND  ANAEROBIC Blood Culture adequate volume Performed at [facility], [address] [place], [place] [address]    Culture   Final    NO GROWTH 5 DAYS Performed at [facility], [place] [address], [place], [place] [address]    Report Status 01/02/2018 FINAL  Final  MRSA PCR Screening     Status: None   Collection Time: 12/29/17  6:54 AM  Result Value Ref Range Status   MRSA by PCR NEGATIVE NEGATIVE Final    Comment:        The GeneXpert MRSA Assay (FDA approved for NASAL specimens only), is one component of a comprehensive MRSA colonization surveillance program. It is not intended to diagnose MRSA infection nor to guide or monitor treatment for  MRSA infections. Performed at [facility], [place] [address], [place], [place] [address]   Respiratory Panel by PCR     Status: None   Collection Time: 12/29/17 11:15 AM  Result Value Ref Range Status   Adenovirus NOT DETECTED NOT DETECTED Final   Coronavirus 229E NOT DETECTED NOT DETECTED Final   Coronavirus HKU1 NOT DETECTED NOT DETECTED Final   Coronavirus NL63 NOT DETECTED NOT DETECTED Final   Coronavirus OC43 NOT DETECTED NOT DETECTED Final   Metapneumovirus NOT DETECTED NOT DETECTED Final   Rhinovirus / Enterovirus NOT DETECTED NOT DETECTED Final   Influenza A NOT DETECTED NOT DETECTED Final   Influenza B NOT DETECTED NOT DETECTED Final   Parainfluenza Virus 1 NOT DETECTED NOT DETECTED Final   Parainfluenza Virus 2 NOT DETECTED NOT DETECTED Final   Parainfluenza Virus 3 NOT DETECTED NOT DETECTED Final   Parainfluenza Virus 4 NOT DETECTED NOT DETECTED Final   Respiratory Syncytial Virus NOT DETECTED NOT DETECTED Final   Bordetella pertussis NOT DETECTED NOT DETECTED Final   Chlamydophila pneumoniae NOT DETECTED NOT DETECTED Final   Mycoplasma pneumoniae NOT DETECTED NOT DETECTED Final    Comment: Performed at [facility], [place] [address] [place], [place] [address]  Culture, bal-quantitative     Status:  None (Preliminary result)   Collection Time: 01/02/18  2:28 PM  Result Value Ref Range Status   Specimen Description BRONCHIAL ALVEOLAR LAVAGE  Final   Special Requests NONE  Final   Gram Stain   Final    ABUNDANT WBC PRESENT,BOTH PMN AND MONONUCLEAR NO ORGANISMS SEEN    Culture   Final    NO GROWTH 1 DAY Performed at [facility], [address] [place],  [address]    Report Status PENDING  Incomplete  Acid Fast Smear (AFB)     Status: None   Collection Time: 01/02/18  2:28 PM  Result Value Ref Range Status   AFB Specimen Processing Concentration  Final   Acid Fast Smear Negative  Final    Comment: (NOTE) Performed At: [facility] [place], [place] [id number] Rush Farmer MD IF:[id number]    Source (AFB) BRONCHIAL ALVEOLAR LAVAGE  Final         Radiology Studies: Dg Chest Port 1 View  Result Date: 01/02/2018 CLINICAL DATA:  Right lung ronchi. EXAM: PORTABLE CHEST 1 VIEW COMPARISON:  11/10/2017 FINDINGS: Cardiomediastinal silhouette is normal. Mediastinal contours appear intact. Hazy airspace consolidation in the right upper hemithorax. Diffusely increased interstitial markings. Osseous structures are without acute abnormality. Soft tissues are grossly normal. IMPRESSION: Diffusely increased interstitial markings which may be seen with atypical pneumonia or developing interstitial pulmonary edema. Interval development of hazy airspace consolidation in the right upper hemithorax. Electronically Signed   By: Fidela Salisbury M.D.   On: 01/02/2018 15:03   Dg C-arm Bronchoscopy  Result Date: 01/02/2018 C-ARM BRONCHOSCOPY: Fluoroscopy was utilized by the requesting physician.  No radiographic interpretation.        Scheduled Meds: . aspirin EC  81 mg Oral Daily  . atorvastatin  20 mg Oral QPC breakfast  . azelastine  1 spray Each Nare BID  . fluticasone  1 spray Each Nare Daily  . mouth rinse  15 mL Mouth Rinse BID  . metoprolol  succinate  100 mg Oral Daily  . sodium chloride flush  3 mL Intravenous Q12H   Continuous Infusions: . sodium chloride 10 mL/hr at 01/02/18 1354  LOS: 6 days    Time spent: 35 minutes.     Elmarie Shiley, MD [facility] Pager [phone]  If 7PM-7AM, please contact night-coverage [email or web address] Password [email or web address] 01/03/2018, 1:34 PM

## 2018-01-04 ENCOUNTER — Telehealth: Payer: Self-pay

## 2018-01-04 DIAGNOSIS — J9621 Acute and chronic respiratory failure with hypoxia: Secondary | ICD-10-CM

## 2018-01-04 LAB — CBC WITH DIFFERENTIAL/PLATELET
Basophils Absolute: 0 10*3/uL (ref 0.0–0.1)
Basophils Relative: 0 %
Eosinophils Absolute: 0 10*3/uL (ref 0.0–0.7)
Eosinophils Relative: 0 %
HEMATOCRIT: 37.4 % (ref 36.0–46.0)
HEMOGLOBIN: 11.9 g/dL — AB (ref 12.0–15.0)
LYMPHS ABS: 0.9 10*3/uL (ref 0.7–4.0)
Lymphocytes Relative: 13 %
MCH: 28.1 pg (ref 26.0–34.0)
MCHC: 31.8 g/dL (ref 30.0–36.0)
MCV: 88.4 fL (ref 78.0–100.0)
Monocytes Absolute: 0.1 10*3/uL (ref 0.1–1.0)
Monocytes Relative: 1 %
NEUTROS ABS: 5.8 10*3/uL (ref 1.7–7.7)
NEUTROS PCT: 86 %
PLATELETS: 472 10*3/uL — AB (ref 150–400)
RBC: 4.23 MIL/uL (ref 3.87–5.11)
RDW: 14.2 % (ref 11.5–15.5)
WBC: 6.8 10*3/uL (ref 4.0–10.5)

## 2018-01-04 LAB — CULTURE, BAL-QUANTITATIVE W GRAM STAIN: Culture: NO GROWTH

## 2018-01-04 LAB — PNEUMOCYSTIS JIROVECI SMEAR BY DFA: Pneumocystis jiroveci Ag: NEGATIVE

## 2018-01-04 MED ORDER — PREDNISONE 20 MG PO TABS: 40.0000 mg | ORAL_TABLET | Freq: Every day | ORAL | 0 refills | Status: DC

## 2018-01-04 MED ORDER — FUROSEMIDE 20 MG PO TABS: 20.0000 mg | ORAL_TABLET | Freq: Two times a day (BID) | ORAL | 0 refills | Status: AC | PRN

## 2018-01-04 NOTE — Discharge Summary (Signed)
Discharge Summary  Hannah Colon QJF:[id number] DOB: March 29, 1956  PCP: Elisabeth Cara, PA-C  Admit date: 12/28/2017 Discharge date: 01/04/2018  Time spent: < 25 minutes  Admitted From: Home Disposition: Home  Recommendations for Outpatient Follow-up:  1. Follow-up with pulmonary in 2 weeks (chest x-ray and PFTs 2. Continue prednisone 40 mg until follow-up with pulmonary  Home Health: No Equipment/Devices: None  Discharge Diagnoses:  Active Hospital Problems   Diagnosis Date Noted  . Acute on chronic respiratory failure with hypoxia ([place]) 12/28/2017  . Mold exposure   . Chronic cough   . ILD (interstitial lung disease) ([place])   . Acute on chronic diastolic congestive heart failure ([place]) 10/03/2017  . Essential hypertension 05/13/2017  . Heart murmur 05/13/2017  . Hyperglycemia 05/13/2017  . Hyperlipidemia 05/13/2017    Resolved Hospital Problems   Diagnosis Date Noted Date Resolved  . SOB (shortness of breath)  12/29/2017    Discharge Condition: Stable  CODE STATUS: Full code Diet recommendation: Heart Healthy / Carb Modified / Regular / Dysphagia   Vitals:   01/03/18 2152 01/04/18 0523  BP: 130/68 109/66  Pulse: 72 65  Resp: 18 18  Temp: 98.3 F (36.8 C) 97.7 F (36.5 C)  SpO2: 98% 95%    History of present illness:  Hannah Colon is a 62 y.o. year old female with medical history significant for hypertrophic obstructive cardiomyopathy, chronic diastolic CHF, possible interstitial lung disease with intermittent hypoxia(+ A. Pullulans antibody 10/2017-and hypersensitivity pneumonitis workup), anemia of chronic disease HTN, HLD, chronic diastolic heart failure who presented on 12/28/2017 with worsening dyspnea on exertion, orthopnea, increased cough and was found to have acute hypoxic respiratory failure presumed secondary to hypersensitivity pneumonitis  Hospital Course:  Principal Problem:   Acute on chronic respiratory failure with hypoxia (HCC) Active  Problems:   Essential hypertension   Hyperlipidemia   Hyperglycemia   Heart murmur   Acute on chronic diastolic congestive heart failure (HCC)   ILD (interstitial lung disease) (HCC)   Mold exposure   Chronic cough   #Acute hypoxic respiratory failure presumed secondary to hypersensitivity pneumonitis - PTA, 3/27 patient was evaluated by Dr. Stanford Breed (cardiology) worsening dyspnea despite increased Lasix and Toprol (did not tolerate due to low BP) found to be hypoxic with SPO2 of 81% prompting admission -Chest x-ray demonstrated increased interstitial markings, questionable pneumonitis vs CHF exac in pt w/HOCM  -  IV Lasix 20 mg x1 diuresis with only minimal improvement.   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 -Prednisone 40 mg daily was started on 4/2 -On discharge patient was ambulating with normal oxygen saturations on room air - We will continue prednisone 40 mg daily, follow-up in pulmonary clinic, plan for follow-up chest x-ray and PFTs in 2 weeks -Patient also continued on oral Lasix daily and home beta-blocker per cardiology recommendations  #Chronic diastolic heart failure, stable #HOCM, stable -based on normal pressures from right heart cath, and no response to IV diuresis deemed patient's presentation of dyspnea not related to CHF exacerbation or HOCM  -cardiology decreased lasix to PRN and continued BB dose   Antibiotics: None  Microbiology: Bronchoscopy lavage: Acid-fast smear negative, abundant WBC (PMNs and monocytes), no growth on  culture today, no  fungal culture growth, fungal stain pending  Consultations:  Cardiology, pulmonology   Procedures/Studies: 01/02/2018: Bronchoscopy with BAL-interstitial lung disease  12/30/2017 Right heart cath-relatively normal right and left heart pressures with normal PCWP and cardiac output, no evidence of intracardiac shunting Dg Chest 2 View  Result Date: 12/28/2017 CLINICAL DATA:  7-8 days of shortness of breath with wheezing and cough. History of CHF, interstitial lung disease, nonsmoker. EXAM: CHEST - 2 VIEW COMPARISON:  Chest x-ray of November 25, 2017 FINDINGS: The lungs are adequately inflated. The interstitial markings are more prominent diffusely today. There is no alveolar infiltrate. There is no pleural effusion. The heart and pulmonary vascularity are normal. The trachea is midline. The bony thorax exhibits no acute abnormality. IMPRESSION: Increased interstitial markings bilaterally without evidence of pulmonary vascular congestion or cardiomegaly. No alveolar pneumonia. The findings may reflect an acute pneumonitis type process. Electronically Signed   By: David  Martinique M.D.   On: 12/28/2017 11:18   Ct Chest Wo Contrast  Result Date: 12/28/2017 CLINICAL DATA:  Shortness of breath and chronic cough for 8 days, abnormal chest radiograph EXAM: CT CHEST WITHOUT CONTRAST TECHNIQUE: Multidetector CT imaging of the chest was performed following the standard protocol without IV contrast. Sagittal and coronal MPR images reconstructed from axial data set. COMPARISON:  None; correlation chest radiograph 12/28/2017 FINDINGS: Cardiovascular: Atherosclerotic calcifications of aorta and coronary arteries. Aorta normal caliber. Heart normal size. No pericardial effusion. Mediastinum/Nodes: Esophagus normal appearance. Base of cervical region normal appearance. Multiple normal size mediastinal lymph nodes with a single enlarged precarinal lymph node 12 mm short axis. Lungs/Pleura: Diffuse BILATERAL airspace  infiltrates are seen throughout both lungs with a slight upper lobe predominance. This could represent infection or edema. Minimal central peribronchial thickening. No pleural effusion or pneumothorax. Single calcified pulmonary granuloma LEFT lower lobe. No additional pulmonary mass/nodule. Upper Abdomen: Visualized upper abdomen unremarkable. Musculoskeletal: Chronic appearing compression deformity of T10 vertebral body. No acute osseous findings. IMPRESSION: Diffuse BILATERAL airspace infiltrates throughout both lungs with a slight upper lobe predominance, could represent pulmonary edema or infection. Single nonspecific mildly enlarged precarinal lymph node potentially reactive. Coronary artery atherosclerotic calcifications. Aortic Atherosclerosis (ICD10-I70.0). Electronically Signed   By: Lavonia Dana M.D.   On: 12/28/2017 16:29   Dg Chest Port 1 View  Result Date: 01/02/2018 CLINICAL DATA:  Right lung ronchi. EXAM: PORTABLE CHEST 1 VIEW COMPARISON:  11/10/2017 FINDINGS: Cardiomediastinal silhouette is normal. Mediastinal contours appear intact. Hazy airspace consolidation in the right upper hemithorax. Diffusely increased interstitial markings. Osseous structures are without acute abnormality. Soft tissues are grossly normal. IMPRESSION: Diffusely increased interstitial markings which may be seen with atypical pneumonia or developing interstitial pulmonary edema. Interval development of hazy airspace consolidation in the right upper hemithorax. Electronically Signed   By: Fidela Salisbury M.D.   On: 01/02/2018 15:03   Dg C-arm Bronchoscopy  Result Date: 01/02/2018 C-ARM BRONCHOSCOPY: Fluoroscopy was utilized by the requesting physician.  No radiographic interpretation.      Discharge Exam: BP 109/66 (BP Location: Right Arm)   Pulse 65   Temp 97.7 F (36.5 C) (Oral)   Resp 18   Ht 5' (1.524 m)   Wt 56.7 kg (125 lb)   SpO2 95%   BMI 24.41 kg/m   General: Sitting in bed comfortably, no  distress Eyes: EOMI, anicteric ENT: Oral Mucosa clear and moist Neck: normal, no thyromegaly Cardiovascular: regular rate and rhythm, no murmurs, rubs or gallops, no peripheral edema Respiratory: Normal respiratory effort  on room air, lungs clear to auscultation bilaterally Abdomen: soft, non-distended, non-tender, normal bowel sounds Skin: No Rash Musculoskeletal:Good ROM, no contractures. Normal muscle tone Neurologic: Grossly no focal neuro deficit.Mental status AAOx3, speech normal, Psychiatric:Appropriate affect, and mood   Discharge Instructions You were cared for by a hospitalist during your hospital stay. If you have any questions about your discharge medications or the care you received while you were in the hospital after you are discharged, you can call the unit and asked to speak with the hospitalist on call if the hospitalist that took care of you is not available. Once you are discharged, your primary care physician will handle any further medical issues. Please note that NO REFILLS for any discharge medications will be authorized once you are discharged, as it is imperative that you return to your primary care physician (or establish a relationship with a primary care physician if you do not have one) for your aftercare needs so that they can reassess your need for medications and monitor your lab values.  Discharge Instructions    Diet - low sodium heart healthy   Complete by:  As directed    Increase activity slowly   Complete by:  As directed      Allergies as of 01/04/2018      Reactions   Iodinated Diagnostic Agents Anaphylaxis   hypotension   Peanut Oil Swelling   Metrizamide Other (See Comments)   Hypotension - iodinated contrast agents   Penicillins Hives, Rash   Has patient had a PCN reaction causing immediate rash, facial/tongue/throat swelling, SOB or lightheadedness with hypotension: No Has patient had a PCN reaction causing severe rash involving mucus  membranes or skin necrosis: No Has patient had a PCN reaction that required hospitalization: No Has patient had a PCN reaction occurring within the last 10 years: Yes If all of the above answers are "NO", then may proceed with Cephalosporin use. Tolerates Cefepime 10/2017.      Medication List    TAKE these medications   acetaminophen 500 MG tablet Commonly known as:  TYLENOL Take 1,000 mg by mouth every 6 (six) hours as needed for mild pain, moderate pain or headache.   aspirin 81 MG EC tablet Take 1 tablet (81 mg total) by mouth daily.   atorvastatin 20 MG tablet Commonly known as:  LIPITOR Take 20 mg by mouth daily after breakfast.   azelastine 0.1 % nasal spray Commonly known as:  ASTELIN Place 1 spray into both nostrils 2 (two) times daily.   calcium carbonate 500 MG chewable tablet Commonly known as:  TUMS - dosed in mg elemental calcium Chew 1 tablet by mouth 2 (two) times daily as needed for indigestion or heartburn.   fluticasone 50 MCG/ACT nasal spray Commonly known as:  FLONASE Place 1 spray into both nostrils daily.   furosemide 20 MG tablet Commonly known as:  LASIX Take 1 tablet (20 mg total) by mouth 2 (two) times daily as needed. What changed:    when to take this  reasons to take this   metoprolol succinate 50 MG 24 hr tablet Commonly known as:  TOPROL-XL Take 150 mg by mouth daily. Take with or immediately following a meal.   multivitamin with minerals Tabs tablet Take 1 tablet by mouth daily.   OVER THE COUNTER MEDICATION Place 3 drops under the tongue daily. Allergy drops made up at Dr office   potassium chloride 10 MEQ tablet Commonly known as:  K-DUR Take 1 tablet (10  mEq total) by mouth daily. Take along with lasix   predniSONE 20 MG tablet Commonly known as:  DELTASONE Take 2 tablets (40 mg total) by mouth daily with breakfast. Start taking on:  01/05/2018   VOLTAREN 1 % Gel Generic drug:  diclofenac sodium Apply 1 application  topically daily as needed (hand pain).      Allergies  Allergen Reactions  . Iodinated Diagnostic Agents Anaphylaxis    hypotension  . Peanut Oil Swelling  . Metrizamide Other (See Comments)    Hypotension - iodinated contrast agents  . Penicillins Hives and Rash    Has patient had a PCN reaction causing immediate rash, facial/tongue/throat swelling, SOB or lightheadedness with hypotension: No Has patient had a PCN reaction causing severe rash involving mucus membranes or skin necrosis: No Has patient had a PCN reaction that required hospitalization: No Has patient had a PCN reaction occurring within the last 10 years: Yes If all of the above answers are "NO", then may proceed with Cephalosporin use. Tolerates Cefepime 10/2017.       The results of significant diagnostics from this hospitalization (including imaging, microbiology, ancillary and laboratory) are listed below for reference.    Significant Diagnostic Studies: Dg Chest 2 View  Result Date: 12/28/2017 CLINICAL DATA:  7-8 days of shortness of breath with wheezing and cough. History of CHF, interstitial lung disease, nonsmoker. EXAM: CHEST - 2 VIEW COMPARISON:  Chest x-ray of November 25, 2017 FINDINGS: The lungs are adequately inflated. The interstitial markings are more prominent diffusely today. There is no alveolar infiltrate. There is no pleural effusion. The heart and pulmonary vascularity are normal. The trachea is midline. The bony thorax exhibits no acute abnormality. IMPRESSION: Increased interstitial markings bilaterally without evidence of pulmonary vascular congestion or cardiomegaly. No alveolar pneumonia. The findings may reflect an acute pneumonitis type process. Electronically Signed   By: David  Martinique M.D.   On: 12/28/2017 11:18   Ct Chest Wo Contrast  Result Date: 12/28/2017 CLINICAL DATA:  Shortness of breath and chronic cough for 8 days, abnormal chest radiograph EXAM: CT CHEST WITHOUT CONTRAST TECHNIQUE:  Multidetector CT imaging of the chest was performed following the standard protocol without IV contrast. Sagittal and coronal MPR images reconstructed from axial data set. COMPARISON:  None; correlation chest radiograph 12/28/2017 FINDINGS: Cardiovascular: Atherosclerotic calcifications of aorta and coronary arteries. Aorta normal caliber. Heart normal size. No pericardial effusion. Mediastinum/Nodes: Esophagus normal appearance. Base of cervical region normal appearance. Multiple normal size mediastinal lymph nodes with a single enlarged precarinal lymph node 12 mm short axis. Lungs/Pleura: Diffuse BILATERAL airspace infiltrates are seen throughout both lungs with a slight upper lobe predominance. This could represent infection or edema. Minimal central peribronchial thickening. No pleural effusion or pneumothorax. Single calcified pulmonary granuloma LEFT lower lobe. No additional pulmonary mass/nodule. Upper Abdomen: Visualized upper abdomen unremarkable. Musculoskeletal: Chronic appearing compression deformity of T10 vertebral body. No acute osseous findings. IMPRESSION: Diffuse BILATERAL airspace infiltrates throughout both lungs with a slight upper lobe predominance, could represent pulmonary edema or infection. Single nonspecific mildly enlarged precarinal lymph node potentially reactive. Coronary artery atherosclerotic calcifications. Aortic Atherosclerosis (ICD10-I70.0). Electronically Signed   By: Lavonia Dana M.D.   On: 12/28/2017 16:29   Dg Chest Port 1 View  Result Date: 01/02/2018 CLINICAL DATA:  Right lung ronchi. EXAM: PORTABLE CHEST 1 VIEW COMPARISON:  11/10/2017 FINDINGS: Cardiomediastinal silhouette is normal. Mediastinal contours appear intact. Hazy airspace consolidation in the right upper hemithorax. Diffusely increased interstitial markings. Osseous structures are without  acute abnormality. Soft tissues are grossly normal. IMPRESSION: Diffusely increased interstitial markings which may be  seen with atypical pneumonia or developing interstitial pulmonary edema. Interval development of hazy airspace consolidation in the right upper hemithorax. Electronically Signed   By: Fidela Salisbury M.D.   On: 01/02/2018 15:03   Dg C-arm Bronchoscopy  Result Date: 01/02/2018 C-ARM BRONCHOSCOPY: Fluoroscopy was utilized by the requesting physician.  No radiographic interpretation.    Microbiology: Recent Results (from the past 240 hour(s))  Blood culture (routine x 2)     Status: None   Collection Time: 12/28/17  4:55 PM  Result Value Ref Range Status   Specimen Description   Final    BLOOD LEFT ANTECUBITAL Performed at [facility], [address] [place], [place] [address]    Special Requests   Final    BOTTLES DRAWN AEROBIC AND ANAEROBIC Blood Culture adequate volume Performed at [facility], [address] [place], [place] [address]    Culture   Final    NO GROWTH 5 DAYS Performed at [facility], [place] [address], [place], [place] [address]    Report Status 01/02/2018 FINAL  Final  Blood culture (routine x 2)     Status: None   Collection Time: 12/28/17  5:00 PM  Result Value Ref Range Status   Specimen Description   Final    BLOOD RIGHT ANTECUBITAL Performed at [facility], [address] [place], [place] [address]    Special Requests   Final    BOTTLES DRAWN AEROBIC AND ANAEROBIC Blood Culture adequate volume Performed at [facility], [address] [place], [place] [address]    Culture   Final    NO GROWTH 5 DAYS Performed at [facility], [place] [address] [place],  [address]    Report Status 01/02/2018 FINAL  Final  MRSA PCR Screening     Status: None   Collection Time: 12/29/17  6:54 AM  Result Value Ref Range Status   MRSA by PCR NEGATIVE NEGATIVE Final    Comment:        The GeneXpert MRSA Assay (FDA approved for NASAL specimens only), is one component of a comprehensive MRSA  colonization surveillance program. It is not intended to diagnose MRSA infection nor to guide or monitor treatment for MRSA infections. Performed at [facility], [place] [address] [place],  [address]   Respiratory Panel by PCR     Status: None   Collection Time: 12/29/17 11:15 AM  Result Value Ref Range Status   Adenovirus NOT DETECTED NOT DETECTED Final   Coronavirus 229E NOT DETECTED NOT DETECTED Final   Coronavirus HKU1 NOT DETECTED NOT DETECTED Final   Coronavirus NL63 NOT DETECTED NOT DETECTED Final   Coronavirus OC43 NOT DETECTED NOT DETECTED Final   Metapneumovirus NOT DETECTED NOT DETECTED Final   Rhinovirus / Enterovirus NOT DETECTED NOT DETECTED Final   Influenza A NOT DETECTED NOT DETECTED Final   Influenza B NOT DETECTED NOT DETECTED Final   Parainfluenza Virus 1 NOT DETECTED NOT DETECTED Final   Parainfluenza Virus 2 NOT DETECTED NOT DETECTED Final   Parainfluenza Virus 3 NOT DETECTED NOT DETECTED Final   Parainfluenza Virus 4 NOT DETECTED NOT DETECTED Final   Respiratory Syncytial Virus NOT DETECTED NOT DETECTED Final   Bordetella pertussis NOT DETECTED NOT DETECTED Final   Chlamydophila pneumoniae NOT DETECTED NOT DETECTED Final   Mycoplasma pneumoniae  NOT DETECTED NOT DETECTED Final    Comment: Performed at [facility], [place] [address], [place], [place] [address]  Culture, bal-quantitative     Status: None   Collection Time: 01/02/18  2:28 PM  Result Value Ref Range Status   Specimen Description BRONCHIAL ALVEOLAR LAVAGE  Final   Special Requests NONE  Final   Gram Stain   Final    ABUNDANT WBC PRESENT,BOTH PMN AND MONONUCLEAR NO ORGANISMS SEEN    Culture   Final    NO GROWTH 2 DAYS Performed at [facility], [address] [place], [place] [address]    Report Status 01/04/2018 FINAL  Final  Pneumocystis smear by DFA     Status: None   Collection Time: 01/02/18  2:28 PM  Result Value Ref Range Status   Specimen Source-PJSRC  BRONCHIAL ALVEOLAR LAVAGE  Final   Pneumocystis jiroveci Ag NEGATIVE  Final    Comment: Performed at [facility] Performed at [facility], [address], [place], [place] [address]   Acid Fast Smear (AFB)     Status: None   Collection Time: 01/02/18  2:28 PM  Result Value Ref Range Status   AFB Specimen Processing Concentration  Final   Acid Fast Smear Negative  Final    Comment: (NOTE) Performed At: [facility] [place], [place] [id number] Rush Farmer MD LK:[id number]    Source (AFB) BRONCHIAL ALVEOLAR LAVAGE  Final     Labs: Basic Metabolic Panel: Recent Labs  Lab 12/30/17 0227 12/31/17 0301 01/01/18 0255 01/02/18 0116 01/03/18 0432  NA 133* 136 135 135 137  K 3.6 4.0 4.0 4.0 4.3  CL 100* 102 101 100* 100*  CO2 _0 GLUCOSE 110* 108* 98 112* 99  BUN _1 CREATININE 0.69 0.61 0.71 0.61 0.63  CALCIUM 9.2 9.3 9.5 9.2 9.3   Liver Function Tests: No results for input(s): AST, ALT, ALKPHOS, BILITOT, PROT, ALBUMIN in the last 168 hours. No results for input(s): LIPASE, AMYLASE in the last 168 hours. No results for input(s): AMMONIA in the last 168 hours. CBC: Recent Labs  Lab 12/30/17 0227 01/02/18 0116 01/03/18 0432 01/04/18 0422  WBC 12.4* 6.6 6.5 6.8  NEUTROABS 8.6* 3.7 3.8 5.8  HGB 11.6* 11.5* 11.5* 11.9*  HCT 36.4 35.5* 36.0 37.4  MCV 87.5 88.3 88.7 88.4  PLT 344 366 396 472*   Cardiac Enzymes: Recent Labs  Lab 12/29/17 0443  TROPONINI <0.03   BNP: BNP (last 3 results) Recent Labs    10/07/17 1206 12/28/17 1123 12/29/17 0443  BNP 98.8 170.7* 77.6    ProBNP (last 3 results) No results for input(s): PROBNP in the last 8760 hours.  CBG: No results for input(s): GLUCAP in the last 168 hours.     Signed:  Desiree Hane, MD [facility] 01/04/2018, 2:57 PM

## 2018-01-04 NOTE — Telephone Encounter (Signed)
I have attached this staff message from Dr. Isaiah SergeMannam to this telephone message.   I called patients number and left a message for the patient to call us back in regards to making a follow up appointment. Per Dr. Isaiah SergeMannam patient needs a two week follow up with cxr and pft on return.   Routing this to triage to call patient back.

## 2018-01-04 NOTE — Progress Notes (Addendum)
SATURATION QUALIFICATIONS: (This note is used to comply with regulatory documentation for home oxygen)  Patient Saturations on Room Air at Rest = 98%  Patient Saturations on Room Air while Ambulating = 98%  Patient Saturations on 0 Liters of oxygen while Ambulating = 98%  Please briefly explain why patient needs home oxygen: Denies SOB.  1645 Pt denies SOB, feeling a lot better, ready to go home. Discharge instructions given, pt verbalized understanding. Discharged home.

## 2018-01-04 NOTE — Telephone Encounter (Signed)
Pt is calling back [phone]

## 2018-01-04 NOTE — Telephone Encounter (Signed)
Called and spoke with patient. Patient already has follow up appointment scheduled on 4/23. Dr. Isaiah SergeMannam wants patient to also have PFT and chest xray. Scheduled patient for PFT on 4/22, nothing was available on the 23 rd. Also placed order for chest xray. Patient reports that she will plan to come in early and get chest xray and then have PFT done following. Nothing further is needed.

## 2018-01-04 NOTE — Progress Notes (Signed)
   Pt can continue on her current BB and use lasix 20 mg daily as needed per Dr. Jens Somrenshaw. She has a cardiology follow up with Dr. Jens Somrenshaw in May.   Berton BonJanine Sender Rueb, AGNP-C [facility] 01/04/2018  10:59 AM Pager: [phone]

## 2018-01-04 NOTE — Telephone Encounter (Signed)
-----   Message from Chilton GreathousePraveen Mannam, MD sent at 01/03/2018  5:00 PM EDT ----- Regarding: Follow up Mrs Hannah Colon is a patient of Dr. Marchelle Gearingamaswamy. Will likely be discharged from hospital soon. Can you make follow up in 2 weeks PFTs and CXR to be done on return clinic visit. Thanks  eBayPraveen

## 2018-01-09 ENCOUNTER — Telehealth: Payer: Self-pay | Admitting: Cardiology

## 2018-01-09 NOTE — Telephone Encounter (Signed)
Spoke with pt, follow up appointment rescheduled per patient request.

## 2018-01-09 NOTE — Telephone Encounter (Signed)
New Message:   Please call,pt did not state what she wanted.

## 2018-01-16 ENCOUNTER — Telehealth: Payer: Self-pay | Admitting: Internal Medicine

## 2018-01-16 MED ORDER — PREDNISONE 20 MG PO TABS: 40.0000 mg | ORAL_TABLET | Freq: Every day | ORAL | 0 refills | Status: DC

## 2018-01-16 NOTE — Telephone Encounter (Signed)
Spoke with pt. States that when she was discharged from the hospital she was instructed to take prednisone 40mg  daily until her appointment with Dr. Marchelle Gearingamaswamy. They did not give her enough medication to last until her appointment on 01/24/18. Rx has been sent in to last until her appointment. Nothing further was needed.

## 2018-01-23 ENCOUNTER — Ambulatory Visit: Payer: 59

## 2018-01-23 ENCOUNTER — Ambulatory Visit (INDEPENDENT_AMBULATORY_CARE_PROVIDER_SITE_OTHER)
Admission: RE | Admit: 2018-01-23 | Discharge: 2018-01-23 | Disposition: A | Discharge status: Home / Self Care | Payer: 59 | Source: Ambulatory Visit | Attending: Pulmonary Disease | Admitting: Pulmonary Disease

## 2018-01-23 DIAGNOSIS — J9621 Acute and chronic respiratory failure with hypoxia: Secondary | ICD-10-CM | POA: Diagnosis not present

## 2018-01-23 LAB — PULMONARY FUNCTION TEST
DL/VA % pred: 93 %
DL/VA: 3.96 ml/min/mmHg/L
DLCO UNC % PRED: 68 %
DLCO unc: 12.99 ml/min/mmHg
FEF 25-75 PRE: 2.17 L/s
FEF 25-75 Post: 2.14 L/sec
FEF2575-%Change-Post: -1 %
FEF2575-%PRED-POST: 102 %
FEF2575-%Pred-Pre: 103 %
FEV1-%CHANGE-POST: 3 %
FEV1-%PRED-POST: 74 %
FEV1-%PRED-PRE: 71 %
FEV1-POST: 1.62 L
FEV1-Pre: 1.56 L
FEV1FVC-%Change-Post: -1 %
FEV1FVC-%PRED-PRE: 112 %
FEV6-%Change-Post: 5 %
FEV6-%PRED-POST: 69 %
FEV6-%Pred-Pre: 65 %
FEV6-POST: 1.87 L
FEV6-Pre: 1.78 L
FEV6FVC-%PRED-PRE: 104 %
FEV6FVC-%Pred-Post: 104 %
FVC-%Change-Post: 5 %
FVC-%Pred-Post: 66 %
FVC-%Pred-Pre: 63 %
FVC-Post: 1.87 L
FVC-Pre: 1.78 L
PRE FEV6/FVC RATIO: 100 %
Post FEV1/FVC ratio: 87 %
Post FEV6/FVC ratio: 100 %
Pre FEV1/FVC ratio: 88 %
RV % PRED: 99 %
RV: 1.8 L
TLC % PRED: 86 %
TLC: 3.87 L

## 2018-01-24 ENCOUNTER — Ambulatory Visit (INDEPENDENT_AMBULATORY_CARE_PROVIDER_SITE_OTHER): Payer: 59 | Admitting: Internal Medicine

## 2018-01-24 ENCOUNTER — Telehealth: Payer: Self-pay | Admitting: Internal Medicine

## 2018-01-24 ENCOUNTER — Encounter: Payer: Self-pay | Admitting: Internal Medicine

## 2018-01-24 VITALS — BP 120/70 | HR 72 | Ht 60.0 in | Wt 119.8 lb

## 2018-01-24 DIAGNOSIS — Z77120 Contact with and (suspected) exposure to mold (toxic): Secondary | ICD-10-CM

## 2018-01-24 DIAGNOSIS — R053 Chronic cough: Secondary | ICD-10-CM

## 2018-01-24 DIAGNOSIS — R05 Cough: Secondary | ICD-10-CM | POA: Diagnosis not present

## 2018-01-24 DIAGNOSIS — J679 Hypersensitivity pneumonitis due to unspecified organic dust: Secondary | ICD-10-CM

## 2018-01-24 DIAGNOSIS — J849 Interstitial pulmonary disease, unspecified: Secondary | ICD-10-CM | POA: Diagnosis not present

## 2018-01-24 MED ORDER — PREDNISONE 10 MG PO TABS: ORAL_TABLET | ORAL | 0 refills | Status: DC

## 2018-01-24 NOTE — Patient Instructions (Addendum)
ICD-10-CM   1. 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 ILD (interstitial lung disease) (HCC) J84.9   3. Mold exposure Z77.120   4. Chronic cough R05    Your diagnosis is hypersensitivity pneumonitis This is a form of interstitial lung disease You developed this because of exposure to mold at her workplace Treatment for this is prednisone over many months to a few years Treatment for this is also to avoid mold at all and any cost  Plan - can return to work only if guaranteed a mold free work place  - reduce prednisone from 40mg  per day to 30mg  per day and continue till next visit  - for cough  - try sugarless lozenge, drinking water and voice rest for few days  Followup   in 1 month do Pre-bd spiro and dlco only. No lung volume or bd response. No post-bd spiro   Return to see Dr Marchelle Gearingamaswamy in 4-6 weeks in ILD clinic

## 2018-01-24 NOTE — Addendum Note (Signed)
Addended by: Kerin RansomBLACKWELL, Dawne Casali on: 01/24/2018 10:18 AM   Modules accepted: Orders

## 2018-01-24 NOTE — Telephone Encounter (Signed)
Called and spoke to patient. Patient stated that they had tried to remember everything during office visit today 4/23 but once they were home remembered that she wanted to ask about a handicap placard.   Placing handicap placard form in MR's look at folder for patient.  Irving Burtonmily will you let patient know when he has completed? Thank you!

## 2018-01-24 NOTE — Progress Notes (Signed)
Subjective:     Patient ID: Hannah Colon, female   DOB: 10/21/1955, 62 y.o.   MRN: [id number]  HPI   OV 11/10/2017  Chief Complaint  Patient presents with  . Advice Only    Referred by Marni Griffon, PA. Pt was in hosp. 1/4-1/10 for acute resp failure with hypoxia but states that she was mainly in there because of heart problems.  Pt does currently have a dry cough and states she is SOB today.     [name] presents for follow-up from the hospital.  She was admitted with acute hypoxemic respiratory failure with diffuse groundglass opacities.  She had his ground glass opacities on the CT scan in January 2019 but also towards the end of 2018.  Therefore interstitial lung disease was suspected.  She underwent autoimmune workup but this was negative.  At that time we did an echocardiogram and it showed severe hypertrophic obstructive cardiomyopathy and with the treatment of this hypoxemia improved and a chest x-ray improved.  But given the chronicity of the chest x-ray findings the presence of ILD was still entertained.  Therefore she presents for follow-up.  Currently she is improved.  She is able to do her bed.  She sees Dr. Stanford Breed for cardiology follow-up.  Her metoprolol has been increased.  She is slowly gaining her strength.  She is doing activities of daily living such as making the bed.  She still can get short of breath when she exerts.  Today when I exerted her she did not desaturate to significance but she did drop below 3 points suggesting the presence of interstitial infiltrates.  The exam did show crackles.  Walking desaturation test on 11/10/2017 185 feet x 3 laps on ROOM AIR:  did not desaturate. Rest pulse ox was 99%, final pulse ox was 91%. HR response 76/min at rest to 84/min at peak exertion. Patient Hannah Colon  Did not Desaturate < 88% . [name] did yes  Desaturated </= 3% points. [name] did not get [facility] note 12/31/17 los 3 days. Main  issue now is severe cough despiote teassoln perles. Had RHC and PcWP 10. Bronch with BAL and TTBx planned Monday 01/02/18 by Dr Lamonte Sakai.    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  I discoveredt his only now. Last visit Feb 2019 and in Jan 2019 she denied mold exposure. 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 Says her office room  has had significant mold all along and in 2018 they cleaned it out while she was still working in office. Last worked there dec 2018. 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 Range 01/02/2018 14:54  Color, Fluid Latest Ref Range: YELLOW  COLORLESS (A)  WBC, Fluid Latest Ref Range: 0 - 1,000 cu mm 678  Lymphs, Fluid Latest Units: % 70  Eos, Fluid Latest Units: % 10  Appearance, Fluid Latest Ref Range: CLEAR  HAZY (A)  Other Cells, Fluid Latest Units: % MESOTHELIAL CELLS...  Neutrophil Count, Fluid Latest Ref Range: 0 - 25 % 14  Monocyte-Macrophage-Serous Fluid Latest Ref Range: 50 - 90 % 6 (L)    CD4: CD8 ratio 2:1   OV 01/24/2018  Chief Complaint  Patient presents with  . Follow-up    Hospitilized 3/27 for SOB. Dry cough following PFT done yesterday. Overall she reports she has been ok.    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  This is in the setting of hypertrophic obstructive cardiomyopathy.  Symptom of severe cough  On prednisone 76m per day x 3 weeks. Cough resolved with this. YHesterday had PFT that shows restriction - and cough relapsed a bit. Husband here with her. They want to go over diagnosis. OVerall feeling well. No new issues. Wants to return to work.      Results for Hannah, [name](MRN [id number] as of 01/24/2018 09:31  Ref. Range 01/23/2018 15:25  FVC-Pre Latest Units: L 1.78  FVC-%Pred-Pre Latest Units: % 63  Results for Hannah, Colon(MRN [id number] as of 01/24/2018 09:31  Ref. Range 01/23/2018 15:25  TLC Latest Units: L 3.87  TLC % pred Latest Units: % 86   Results for Hannah, Colon(MRN [id number] as of 01/24/2018 09:31  Ref. Range 01/23/2018 15:25  DLCO unc Latest Units: ml/min/mmHg 12.99  DLCO unc % pred Latest Units: % 68   Results for Hannah, [name](MRN [id number] as of 01/24/2018 09:31  Ref. Range 01/23/2018 15:25  FEV1-Pre Latest Units: L 1.56  FEV1-%Pred-Pre Latest Units: % 71  Pre FEV1/FVC ratio Latest Units: % 88    Walking desaturation test on 01/24/2018 185 feet x 3 laps on ROOM AIR:  did dd not desaturate. Rest pulse ox was 97%, final pulse ox was 95%. HR response 79/min at rest to 87/min at peak exertion. Patient BShaunika Colon Did not Desaturate < 88% . BBeatrix Fettersdid not  Desaturated </= 3% points. BBeatrix Fettersdid not get tachyardic     has a past medical history of Abnormal pulmonary finding, Anemia of chronic disease, Chronic diastolic CHF (congestive heart failure) ([place], Environmental and seasonal allergies, High cholesterol, Hypertension, and Hypertensive heart disease (05/13/2017).   reports that she has never smoked. She has never used smokeless tobacco.  Past Surgical History:  Procedure Laterality Date  . ABDOMINAL HYSTERECTOMY    . FINGER SURGERY Right 11/22/2017  . RIGHT HEART CATH  N/A 12/30/2017   Procedure: RIGHT HEART CATH;  Surgeon: BJolaine Artist MD;  [facility];  Service: Cardiovascular;  Laterality: N/A;  . SHOULDER ARTHROSCOPY Right   . VIDEO BRONCHOSCOPY Bilateral 01/02/2018   Procedure: VIDEO BRONCHOSCOPY WITH FLUORO;  Surgeon: MMarshell Garfinkel MD;  [facility];  Service: Cardiopulmonary;  Laterality: Bilateral;    Allergies  Allergen Reactions  . Iodinated Diagnostic Agents Anaphylaxis    hypotension  . Peanut Oil Swelling  . Metrizamide Other (See Comments)    Hypotension - iodinated contrast agents  . Penicillins Hives and Rash    Has patient had a PCN reaction causing immediate rash, facial/tongue/throat swelling, SOB or lightheadedness with hypotension: No Has patient had a PCN reaction causing severe rash involving mucus membranes or  skin necrosis: No Has patient had a PCN reaction that required hospitalization: No Has patient had a PCN reaction occurring within the last 10 years: Yes If all of the above answers are "NO", then may proceed with Cephalosporin use. Tolerates Cefepime 10/2017.      There is no immunization history on file for this patient.  Family History  Problem Relation Age of Onset  . Dementia Mother   . Asthma Maternal Grandmother   . Heart failure Maternal Uncle      Current Outpatient Medications:  .  acetaminophen (TYLENOL) 500 MG tablet, Take 1,000 mg by mouth every 6 (six) hours as needed for mild pain, moderate pain or headache., Disp: , Rfl:  .  aspirin EC 81 MG EC tablet, Take 1 tablet (81 mg total) by mouth daily., Disp: 30 tablet, Rfl: 0 .  atorvastatin (LIPITOR) 20 MG tablet, Take 20 mg by mouth daily after breakfast. , Disp: , Rfl:  .  azelastine (ASTELIN) 0.1 % nasal spray, Place 1 spray into both nostrils 2 (two) times daily., Disp: , Rfl:  .  calcium carbonate (TUMS - DOSED IN MG ELEMENTAL CALCIUM) 500 MG chewable tablet, Chew 1 tablet by mouth 2 (two) times daily as needed  for indigestion or heartburn., Disp: , Rfl:  .  fluticasone (FLONASE) 50 MCG/ACT nasal spray, Place 1 spray into both nostrils daily., Disp: , Rfl:  .  furosemide (LASIX) 20 MG tablet, Take 1 tablet (20 mg total) by mouth 2 (two) times daily as needed., Disp: 30 tablet, Rfl: 0 .  metoprolol succinate (TOPROL-XL) 50 MG 24 hr tablet, Take 100 mg by mouth daily. Take with or immediately following a meal. , Disp: , Rfl:  .  Multiple Vitamin (MULTIVITAMIN WITH MINERALS) TABS tablet, Take 1 tablet by mouth daily., Disp: , Rfl:  .  OVER THE COUNTER MEDICATION, Place 3 drops under the tongue daily. Allergy drops made up at Dr office, Disp: , Rfl:  .  potassium chloride (K-DUR) 10 MEQ tablet, Take 1 tablet (10 mEq total) by mouth daily. Take along with lasix, Disp: 30 tablet, Rfl: 2 .  predniSONE (DELTASONE) 20 MG tablet, Take 2 tablets (40 mg total) by mouth daily with breakfast., Disp: 30 tablet, Rfl: 0 .  VOLTAREN 1 % GEL, Apply 1 application topically daily as needed (hand pain). , Disp: , Rfl:     Review of Systems     Objective:   Physical Exam  Constitutional: She is oriented to person, place, and time. She appears well-developed and well-nourished. No distress.  Mildly cushingoid  HENT:  Head: Normocephalic and atraumatic.  Right Ear: External ear normal.  Left Ear: External ear normal.  Mouth/Throat: Oropharynx is clear and moist. No oropharyngeal exudate.  Eyes: Pupils are equal, round, and reactive to light. Conjunctivae and EOM are normal. Right eye exhibits no discharge. Left eye exhibits no discharge. No scleral icterus.  Neck: Normal range of motion. Neck supple. No JVD present. No tracheal deviation present. No thyromegaly present.  Cardiovascular: Normal rate, regular rhythm and intact distal pulses. Exam reveals no gallop and no friction rub.  Murmur heard. Pulmonary/Chest: Effort normal and breath sounds normal. No respiratory distress. She has no wheezes. She has no rales. She  exhibits no tenderness.  Abdominal: Soft. Bowel sounds are normal. She exhibits no distension and no mass. There is no tenderness. There is no rebound and no guarding.  Musculoskeletal: Normal range of motion. She exhibits no edema or tenderness.  Lymphadenopathy:  She has no cervical adenopathy.  Neurological: She is alert and oriented to person, place, and time. She has normal reflexes. No cranial nerve deficit. She exhibits normal muscle tone. Coordination normal.  Skin: Skin is warm and dry. No rash noted. She is not diaphoretic. No erythema. No pallor.  Psychiatric: She has a normal mood and affect. Her behavior is normal. Judgment and thought content normal.  Vitals reviewed.  Vitals:   01/24/18 0912  BP: 120/70  Pulse: 72  SpO2: 97%  Weight: 119 lb 12.8 oz (54.3 kg)  Height: 5' (1.524 m)    Estimated body mass index is 23.4 kg/m as calculated from the following:   Height as of this encounter: 5' (1.524 m).   Weight as of this encounter: 119 lb 12.8 oz (54.3 kg).     Assessment:       ICD-10-CM   1. Hypersensitivity pneumonitis ([place]) J67.9   2. ILD (interstitial lung disease) ([place]) J84.9   3. Mold exposure Z77.120   4. Chronic cough R05        Plan:      Your diagnosis is hypersensitivity pneumonitis This is a form of interstitial lung disease You developed this because of exposure to mold at her workplace Treatment for this is prednisone over many months to a few years Treatment for this is also to avoid mold at all and any cost  Plan - can return to work only if guaranteed a mold free work place  - reduce prednisone from 34m per day to 383mper day and continue till next visit  - for cough  - try sugarless lozenge, drinking water and voice rest for few days  Followup   in 1 month do Pre-bd spiro and dlco only. No lung volume or bd response. No post-bd spiro   Return to see Dr RaChase Callern 4-6 weeks in ILD clinic    > 50% of this > 25 min visit spent  in face to face counseling or coordination of care    Dr. MuBrand MalesM.D., F.[facility].P Pulmonary and Critical Care Medicine Staff Physician, CoHamiltonirector - Interstitial Lung Disease  Program  Pulmonary [id number]: [phone] no answer or between  15:00h - 7:00h: call [phone] Telephone: [phone]  > 50% of this > 25 min visit spent in face to face counseling or coordination of care    Dr. MuBrand MalesM.D., F.[facility].P Pulmonary and Critical Care Medicine Staff Physician, CoGray Courtirector - Interstitial Lung Disease  Program  Pulmonary FiYacoltt [id number]: [phone] no answer or between  15:00h - 7:00h: call [phone] Telephone: [phone]

## 2018-01-24 NOTE — Telephone Encounter (Signed)
Handicap application has been filled out and signed by MR.  Pt has been notified and stated to place application up front and she would come by tomorrow to pick application up.  Stated to pt there are some areas for her to fill out. Pt expressed understanding.  Nothing further needed at this time.

## 2018-01-31 ENCOUNTER — Telehealth: Payer: Self-pay | Admitting: Internal Medicine

## 2018-01-31 MED ORDER — PREDNISONE 20 MG PO TABS: 40.0000 mg | ORAL_TABLET | Freq: Every day | ORAL | 0 refills | Status: DC

## 2018-01-31 NOTE — Telephone Encounter (Signed)
Called and spoke with patient, she states that since doing the PFT she has been having a chronic cough. The prednisone was reduced from  to . Patient is having increased shortness of breath. She states that she gets short of breath easy. Patient states that she is also experiencing chest pain.    MR please advise, thanks.

## 2018-01-31 NOTE — Telephone Encounter (Signed)
We can certainly try prednisone  per day x 7 days and then go down to  daily  Start prilosec  daily empty stomach in case there is gerd going on with prednisone  Chest pain: might be due to gerd but she shold reach out to cards   Dr. Kalman Shan, M.D., [facility].C.P Pulmonary and Critical Care Medicine Staff Physician, [facility] Center Director - Interstitial Lung Disease  Program  Pulmonary Fibrosis [facility] Network at [facility] [place], [place], [address]  Pager: [phone], If no answer or between  15:00h - 7:00h: call [phone] Telephone: [phone]

## 2018-01-31 NOTE — Telephone Encounter (Signed)
Called pt and advised message from the provider. Pt understood and verbalized understanding. Nothing further is needed.   Rx sent.  

## 2018-02-01 ENCOUNTER — Ambulatory Visit: Payer: 59 | Admitting: Cardiology

## 2018-02-01 LAB — FUNGUS CULTURE WITH STAIN

## 2018-02-01 LAB — FUNGUS CULTURE RESULT

## 2018-02-01 LAB — FUNGAL ORGANISM REFLEX

## 2018-02-09 ENCOUNTER — Encounter: Payer: Self-pay | Admitting: Cardiology

## 2018-02-14 LAB — ACID FAST CULTURE WITH REFLEXED SENSITIVITIES

## 2018-02-14 LAB — ACID FAST CULTURE WITH REFLEXED SENSITIVITIES (MYCOBACTERIA): Acid Fast Culture: NEGATIVE

## 2018-02-16 ENCOUNTER — Telehealth: Payer: Self-pay | Admitting: Internal Medicine

## 2018-02-16 MED ORDER — PREDNISONE 20 MG PO TABS: 40.0000 mg | ORAL_TABLET | Freq: Every day | ORAL | 0 refills | Status: DC

## 2018-02-16 MED ORDER — PREDNISONE 10 MG PO TABS: 10.0000 mg | ORAL_TABLET | Freq: Every day | ORAL | 0 refills | Status: DC

## 2018-02-16 NOTE — Telephone Encounter (Signed)
Called pt and advised message from the provider. Pt understood and verbalized understanding. Nothing further is needed.   Rx sent.  

## 2018-02-16 NOTE — Telephone Encounter (Signed)
Called and spoke to the patient. Patient stated that she is running out of current dose of Prednisone . Patient stated that she was on 40 mg until early May. Patient stated that since going down to 30 mg she has noticed increased shortness of breath and decrease in energy. Patient stated that she needs a refill either way but would prefer the 40 mg.   MR please advise. Thanks!

## 2018-02-16 NOTE — Telephone Encounter (Signed)
Ok refill at  per day for 2 weeks and then please have her reduce to  per day to continue - ensure she atleast has 90 days of prednisone at  per day  Dr. Kalman Shan, M.D., [facility].C.P Pulmonary and Critical Care Medicine Staff Physician, [facility] Center Director - Interstitial Lung Disease  Program  Pulmonary Fibrosis [facility] Network at [facility] [place], [place], [address]  Pager: [phone], If no answer or between  15:00h - 7:00h: call [phone] Telephone: [phone]

## 2018-02-17 ENCOUNTER — Telehealth: Payer: Self-pay | Admitting: Internal Medicine

## 2018-02-17 MED ORDER — PREDNISONE 20 MG PO TABS: ORAL_TABLET | ORAL | 0 refills | Status: DC

## 2018-02-17 NOTE — Telephone Encounter (Signed)
Per MR note patient is to take 40 mg of prednisone each day for two weeks then 30 mg daily following. Cancelled prescriptions sent yesterday as they were not sent in correctly. Sent in correct scripts. Nothing further needed.

## 2018-02-22 NOTE — Progress Notes (Signed)
HPI: Follow-up diastolic congestive heart failure and hypertrophic obstructive cardiomyopathy. Nuclear study August 2018 showed ejection fraction 70% and normal perfusion. Echocardiogram January 2019 showed ejection fraction 65-70%, severe left ventricular hypertrophy, systolic anterior motion of the mitral valve with dynamic outflow obstruction with mean gradient 29 mmHg, grade 2 diastolic dysfunction,mild to moderate mitral regurgitation, severe left atrial enlargement and mild right atrial enlargement. CTA January 2019 showed no pulmonary embolus.Cardiac MRI February 2019 consistent with hypertrophic obstructive cardiomyopathy. Holter monitor January 2019 showed no nonsustained ventricular tachycardia. Exercise treadmill January 2019 showed normal systolic blood pressure response. Patient admitted with dyspnea March 2029.  Echocardiogram showed vigorous LV systolic function and systolic anterior motion of the mitral valve but LVOT gradient only 2.5 m/s.  Probable mild to moderate aortic stenosis and mild to moderate mitral regurgitation.  Right heart catheterization March 2019 showed pulmonary capillary wedge pressure 10, PA 37/13 cardiac output 6.1.  Chest CT showed diffuse bilateral airspace disease.  She was diagnosed with interstitial lung disease and placed on steroids. Previous WU showed AUREOBASIDIUM PULLULANS- fungal antigen in water cooling systems and showers. Now followed by pulmonary. Since last seen,the patient has dyspnea with more extreme activities but not with routine activities. It is relieved with rest. It is not associated with chest pain. There is no orthopnea, PND or pedal edema. There is no syncope or palpitations. There is no exertional chest pain.   Current Outpatient Medications  Medication Sig Dispense Refill  . acetaminophen (TYLENOL) 500 MG tablet Take 1,000 mg by mouth every 6 (six) hours as needed for mild pain, moderate pain or headache.    Marland Kitchen aspirin EC 81  MG EC tablet Take 1 tablet (81 mg total) by mouth daily. 30 tablet 0  . atorvastatin (LIPITOR) 20 MG tablet Take 20 mg by mouth daily after breakfast.     . azelastine (ASTELIN) 0.1 % nasal spray Place 1 spray into both nostrils 2 (two) times daily.    . calcium carbonate (TUMS - DOSED IN MG ELEMENTAL CALCIUM) 500 MG chewable tablet Chew 1 tablet by mouth 2 (two) times daily as needed for indigestion or heartburn.    . fluticasone (FLONASE) 50 MCG/ACT nasal spray Place 1 spray into both nostrils daily.    . furosemide (LASIX) 20 MG tablet Take 1 tablet (20 mg total) by mouth 2 (two) times daily as needed. 30 tablet 0  . metoprolol succinate (TOPROL-XL) 50 MG 24 hr tablet Take 100 mg by mouth daily. Take with or immediately following a meal.     . Multiple Vitamin (MULTIVITAMIN WITH MINERALS) TABS tablet Take 1 tablet by mouth daily.    Marland Kitchen OVER THE COUNTER MEDICATION Place 3 drops under the tongue daily. Allergy drops made up at Dr office    . potassium chloride (K-DUR) 10 MEQ tablet Take 1 tablet (10 mEq total) by mouth daily. Take along with lasix 30 tablet 2  . predniSONE (DELTASONE) 20 MG tablet Patient is to take 40 mg (two tablets) each day for two weeks. Then 30 mg (1.5 tablets) each day following. 90 tablet 0  . VOLTAREN 1 % GEL Apply 1 application topically daily as needed (hand pain).      No current facility-administered medications for this visit.      Past Medical History:  Diagnosis Date  . Abnormal pulmonary finding    a. has been considered to have possible ILD.  Marland Kitchen Anemia of chronic disease   . Chronic diastolic CHF (congestive  heart failure) (HCC)   . Environmental and seasonal allergies   . High cholesterol   . Hypertension   . Hypertensive heart disease 05/13/2017    Past Surgical History:  Procedure Laterality Date  . ABDOMINAL HYSTERECTOMY    . FINGER SURGERY Right 11/22/2017  . RIGHT HEART CATH N/A 12/30/2017   Procedure: RIGHT HEART CATH;  Surgeon: Dolores Patty, MD;  Location: [facility];  Service: Cardiovascular;  Laterality: N/A;  . SHOULDER ARTHROSCOPY Right   . VIDEO BRONCHOSCOPY Bilateral 01/02/2018   Procedure: VIDEO BRONCHOSCOPY WITH FLUORO;  Surgeon: Chilton Greathouse, MD;  Location: [facility];  Service: Cardiopulmonary;  Laterality: Bilateral;    Social History   Socioeconomic History  . Marital status: Married    Spouse name: Not on file  . Number of children: Not on file  . Years of education: Not on file  . Highest education level: Not on file  Occupational History  . Occupation: Audiological scientist  Social Needs  . Financial resource strain: Not on file  . Food insecurity:    Worry: Not on file    Inability: Not on file  . Transportation needs:    Medical: Not on file    Non-medical: Not on file  Tobacco Use  . Smoking status: Never Smoker  . Smokeless tobacco: Never Used  Substance and Sexual Activity  . Alcohol use: Yes    Alcohol/week: 2.4 - 3.0 oz    Types: 4 - 5 Glasses of wine per week    Comment: 1 total glass since 12/19  . Drug use: No  . Sexual activity: Not on file  Lifestyle  . Physical activity:    Days per week: Not on file    Minutes per session: Not on file  . Stress: Not on file  Relationships  . Social connections:    Talks on phone: Not on file    Gets together: Not on file    Attends religious service: Not on file    Active member of club or organization: Not on file    Attends meetings of clubs or organizations: Not on file    Relationship status: Not on file  . Intimate partner violence:    Fear of current or ex partner: Not on file    Emotionally abused: Not on file    Physically abused: Not on file    Forced sexual activity: Not on file  Other Topics Concern  . Not on file  Social History Narrative  . Not on file    Family History  Problem Relation Age of Onset  . Dementia Mother   . Asthma Maternal Grandmother   . Heart failure Maternal Uncle     ROS: no fevers or  chills, productive cough, hemoptysis, dysphasia, odynophagia, melena, hematochezia, dysuria, hematuria, rash, seizure activity, orthopnea, PND, pedal edema, claudication. Remaining systems are negative.  Physical Exam: Well-developed well-nourished in no acute distress.  Skin is warm and dry.  HEENT is normal.  Neck is supple.  Chest is clear to auscultation with normal expansion.  Cardiovascular exam is regular rate and rhythm.  2/6 systolic murmur Abdominal exam nontender or distended. No masses palpated. Extremities show no edema. neuro grossly intact  ECG-sinus bradycardia at a rate of 52.  Left ventricular hypertrophy.  Personally reviewed  A/P  1 hypertrophic cardiomyopathy-patient's dyspnea is felt predominantly secondary to interstitial lung disease from hypersensitivity pneumonitis.  This is being treated by pulmonary.  We will continue with present dose of  Toprol and low-dose diuretic as needed for chronic diastolic congestive heart failure.  2 interstitial lung disease-secondary to hypersensitivity pneumonitis.  Followed by pulmonary.  Continue steroids.  She is markedly improved.  3 chronic diastolic congestive heart failure-she is euvolemic.  Continue present dose of Lasix as needed.  4 hypertension-blood pressure is elevated but typically controlled.  Continue present medications and follow.  Olga Millers, MD

## 2018-02-23 ENCOUNTER — Ambulatory Visit (INDEPENDENT_AMBULATORY_CARE_PROVIDER_SITE_OTHER): Payer: 59 | Admitting: Internal Medicine

## 2018-02-23 DIAGNOSIS — J679 Hypersensitivity pneumonitis due to unspecified organic dust: Secondary | ICD-10-CM

## 2018-02-23 LAB — PULMONARY FUNCTION TEST
DL/VA % pred: 95 %
DL/VA: 4.06 ml/min/mmHg/L
DLCO UNC: 13.82 ml/min/mmHg
DLCO cor % pred: 78 %
DLCO cor: 14.76 ml/min/mmHg
DLCO unc % pred: 73 %
FEF 25-75 PRE: 1.87 L/s
FEF2575-%PRED-PRE: 89 %
FEV1-%PRED-PRE: 82 %
FEV1-PRE: 1.79 L
FEV1FVC-%Pred-Pre: 105 %
FEV6-%PRED-PRE: 80 %
FEV6-PRE: 2.19 L
FEV6FVC-%PRED-PRE: 104 %
FVC-%Pred-Pre: 77 %
FVC-Pre: 2.19 L
PRE FEV1/FVC RATIO: 82 %
Pre FEV6/FVC Ratio: 100 %

## 2018-02-23 NOTE — Progress Notes (Signed)
PFT completed 02/23/18 spiro and DLCO only.

## 2018-03-01 ENCOUNTER — Encounter: Payer: Self-pay | Admitting: Cardiology

## 2018-03-01 ENCOUNTER — Ambulatory Visit (INDEPENDENT_AMBULATORY_CARE_PROVIDER_SITE_OTHER): Payer: 59 | Admitting: Cardiology

## 2018-03-01 ENCOUNTER — Encounter: Payer: Self-pay | Admitting: *Deleted

## 2018-03-01 VITALS — BP 150/91 | HR 52 | Ht 60.0 in | Wt 119.8 lb

## 2018-03-01 DIAGNOSIS — I5032 Chronic diastolic (congestive) heart failure: Secondary | ICD-10-CM

## 2018-03-01 DIAGNOSIS — I421 Obstructive hypertrophic cardiomyopathy: Secondary | ICD-10-CM | POA: Diagnosis not present

## 2018-03-01 DIAGNOSIS — I10 Essential (primary) hypertension: Secondary | ICD-10-CM

## 2018-03-01 NOTE — Patient Instructions (Signed)
Your physician wants you to follow-up in: 6 MONTHS WITH DR CRENSHAW You will receive a reminder letter in the mail two months in advance. If you don't receive a letter, please call our office to schedule the follow-up appointment.   If you need a refill on your cardiac medications before your next appointment, please call your pharmacy.  

## 2018-03-07 ENCOUNTER — Ambulatory Visit (INDEPENDENT_AMBULATORY_CARE_PROVIDER_SITE_OTHER): Payer: 59 | Admitting: Internal Medicine

## 2018-03-07 ENCOUNTER — Encounter: Payer: Self-pay | Admitting: Internal Medicine

## 2018-03-07 VITALS — BP 110/62 | HR 54 | Ht 60.0 in | Wt 120.0 lb

## 2018-03-07 DIAGNOSIS — Z569 Unspecified problems related to employment: Secondary | ICD-10-CM

## 2018-03-07 DIAGNOSIS — Z77120 Contact with and (suspected) exposure to mold (toxic): Secondary | ICD-10-CM

## 2018-03-07 DIAGNOSIS — J679 Hypersensitivity pneumonitis due to unspecified organic dust: Secondary | ICD-10-CM | POA: Diagnosis not present

## 2018-03-07 DIAGNOSIS — J849 Interstitial pulmonary disease, unspecified: Secondary | ICD-10-CM | POA: Diagnosis not present

## 2018-03-07 NOTE — Progress Notes (Signed)
Subjective:     Patient ID: Hannah Colon, female   DOB: 24-Oct-1955, 62 y.o.   MRN: [id number]  HPI  OV 11/10/2017  Chief Complaint  Patient presents with  . Advice Only    Referred by Hannah Griffon, PA. Pt was in hosp. 1/4-1/10 for acute resp failure with hypoxia but states that she was mainly in there because of heart problems.  Pt does currently have a dry cough and states she is SOB today.     Hannah Colon presents for follow-up from the hospital.  She was admitted with acute hypoxemic respiratory failure with diffuse groundglass opacities.  She had his ground glass opacities on the CT scan in January 2019 but also towards the end of 2018.  Therefore interstitial lung disease was suspected.  She underwent autoimmune workup but this was negative.  At that time we did an echocardiogram and it showed severe hypertrophic obstructive cardiomyopathy and with the treatment of this hypoxemia improved and a chest x-ray improved.  But given the chronicity of the chest x-ray findings the presence of ILD was still entertained.  Therefore she presents for follow-up.  Currently she is improved.  She is able to do her bed.  She sees Dr. Stanford Colon for cardiology follow-up.  Her metoprolol has been increased.  She is slowly gaining her strength.  She is doing activities of daily living such as making the bed.  She still can get short of breath when she exerts.  Today when I exerted her she did not desaturate to significance but she did drop below 3 points suggesting the presence of interstitial infiltrates.  The exam did show crackles.  Walking desaturation test on 11/10/2017 185 feet x 3 laps on ROOM AIR:  did not desaturate. Rest pulse ox was 99%, final pulse ox was 91%. HR response 76/min at rest to 84/min at peak exertion. Patient Hannah Colon  Did not Desaturate < 88% . Hannah Colon did yes  Desaturated </= 3% points. Hannah Colon did not get [facility] note 12/31/17 los 3 days. Main  issue now is severe cough despiote teassoln perles. Had RHC and PcWP 10. Bronch with BAL and TTBx planned Monday 01/02/18 by Dr Lamonte Sakai.    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  I discoveredt his only now. Last visit Feb 2019 and in Jan 2019 she denied mold exposure. 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 Says her office room  has had significant mold all along and in 2018 they cleaned it out while she was still working in office. Last worked there dec 2018. 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 Range 01/02/2018 14:54  Color, Fluid Latest Ref Range: YELLOW  COLORLESS (A)  WBC, Fluid Latest Ref Range: 0 - 1,000 cu mm 678  Lymphs, Fluid Latest Units: % 70  Eos, Fluid Latest Units: % 10  Appearance, Fluid Latest Ref Range: CLEAR  HAZY (A)  Other Cells, Fluid Latest Units: % MESOTHELIAL CELLS...  Neutrophil Count, Fluid Latest Ref Range: 0 - 25 % 14  Monocyte-Macrophage-Serous Fluid Latest Ref Range: 50 - 90 % 6 (L)    CD4: CD8 ratio 2:1   OV 01/24/2018  Chief Complaint  Patient presents with  . Follow-up    Hospitilized 3/27 for SOB. Dry cough following PFT done yesterday. Overall she reports she has been ok.    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  This is in the setting of hypertrophic obstructive cardiomyopathy.  Symptom of severe cough  On prednisone 12m per day x 3 weeks. Cough resolved with this. YHesterday had PFT that shows restriction - and cough relapsed a bit. Husband here with her. They want to go over diagnosis. OVerall feeling well. No new issues. Wants to return to work.       Walking desaturation test on 01/24/2018 185 feet x 3 laps on ROOM AIR:  did dd not desaturate. Rest pulse ox was 97%, final pulse ox was 95%. HR response 79/min at rest to 87/min at peak exertion. Patient Hannah Colon Did not Desaturate < 88% . Hannah Fettersdid not  Desaturated </= 3% points. BMarcey Persaddid not get tachyardic   OV 03/07/2018  Chief Complaint  Patient presents with  . Follow-up    Pt states she is doing better since last visit. States she has days where fatigue is worse but other than that, she has been doing good.    Followup    ICD-10-CM   1. ILD (interstitial lung disease) (HCC) J84.9 Pulmonary function test  2. Hypersensitivity pneumonitis ([place] J67.9   3. Adverse exposure in workplace Z56.9   4. Mold exposure Z77.120     She is now finished prednisone 452mper day and since few days ago on 3017mrednisone per day. Witht his her breathing is improved but she has gained weight. PFTs show improvement to near normalization. She tells me she is still off work. Her employer is fixing mold exposure and apparently recent recheck still showed mold. There is an old building and new building. She is knowing how and under what circumstances she can go back to work and how long to take prednisone   Results for Hannah Colon, Colon [id number] of 03/07/2018 16:56  Ref. Range 01/23/2018 15:25 02/23/2018 10:07  FVC-Pre Latest Units: L 1.78 2.19  FVC-%Pred-Pre Latest Units: % 63 77   Results for NICTAKEIA, Colon [id number] of 03/07/2018 16:56  Ref. Range  01/23/2018 15:25 02/23/2018 10:07  DLCO unc Latest Units: ml/min/mmHg 12.99 13.82  DLCO unc % pred Latest Units: % 68 73     has a past medical history of Abnormal pulmonary finding, Anemia of chronic disease, Chronic diastolic CHF (congestive heart failure) (HCCS[place]Environmental and seasonal allergies, High cholesterol, Hypertension, and Hypertensive heart disease (05/13/2017).   reports that she has never smoked. She has never used smokeless tobacco.  Past Surgical History:  Procedure Laterality Date  . ABDOMINAL HYSTERECTOMY    . FINGER SURGERY Right 11/22/2017  . RIGHT HEART CATH N/A 12/30/2017   Procedure: RIGHT HEART CATH;  Surgeon: BenJolaine ArtistD;  Location: [facility];  Service: Cardiovascular;  Laterality: N/A;  . SHOULDER ARTHROSCOPY Right   . VIDEO BRONCHOSCOPY Bilateral 01/02/2018   Procedure: VIDEO BRONCHOSCOPY WITH FLUORO;  Surgeon: Marshell Garfinkel, MD;  Location: [facility];  Service: Cardiopulmonary;  Laterality: Bilateral;    Allergies  Allergen Reactions  . Amoxicillin-Pot Clavulanate Rash  . Iodinated Diagnostic Agents Anaphylaxis    hypotension hypotension  . Peanut Oil Swelling  . Penicillins Hives and Rash    Has patient had a PCN reaction causing immediate rash, facial/tongue/throat swelling, SOB or lightheadedness with hypotension: No Has patient had a PCN reaction causing severe rash involving mucus membranes or skin necrosis: No Has patient had a PCN reaction that required hospitalization: No Has patient had a PCN reaction occurring within the last 10 years: Yes If all of the above answers are "NO", then may proceed with Cephalosporin use. Tolerates Cefepime 10/2017.   Marland Kitchen Metrizamide Other (See Comments)    Hypotension - iodinated contrast agents     There is no immunization history on file for this patient.  Family History  Problem Relation Age of Onset  . Dementia Mother   . Asthma Maternal Grandmother   . Heart failure Maternal  Uncle      Current Outpatient Medications:  .  acetaminophen (TYLENOL) 500 MG tablet, Take 1,000 mg by mouth every 6 (six) hours as needed for mild pain, moderate pain or headache., Disp: , Rfl:  .  aspirin EC 81 MG EC tablet, Take 1 tablet (81 mg total) by mouth daily., Disp: 30 tablet, Rfl: 0 .  atorvastatin (LIPITOR) 20 MG tablet, Take 20 mg by mouth daily after breakfast. , Disp: , Rfl:  .  azelastine (ASTELIN) 0.1 % nasal spray, Place 1 spray into both nostrils 2 (two) times daily., Disp: , Rfl:  .  calcium carbonate (TUMS - DOSED IN MG ELEMENTAL CALCIUM) 500 MG chewable tablet, Chew 1 tablet by mouth 2 (two) times daily as needed for indigestion or heartburn., Disp: , Rfl:  .  fluticasone (FLONASE) 50 MCG/ACT nasal spray, Place 1 spray into both nostrils daily., Disp: , Rfl:  .  furosemide (LASIX) 20 MG tablet, Take 1 tablet (20 mg total) by mouth 2 (two) times daily as needed., Disp: 30 tablet, Rfl: 0 .  metoprolol succinate (TOPROL-XL) 50 MG 24 hr tablet, Take 100 mg by mouth daily. Take with or immediately following a meal. , Disp: , Rfl:  .  Multiple Vitamin (MULTIVITAMIN WITH MINERALS) TABS tablet, Take 1 tablet by mouth daily., Disp: , Rfl:  .  OVER THE COUNTER MEDICATION, Place 3 drops under the tongue daily. Allergy drops made up at Dr office, Disp: , Rfl:  .  potassium chloride (K-DUR) 10 MEQ tablet, Take 1 tablet (10 mEq total) by mouth daily. Take along with lasix, Disp: 30 tablet, Rfl: 2 .  predniSONE (DELTASONE) 20 MG tablet, Patient is to take 40 mg (two tablets) each day for two weeks. Then 30 mg (1.5 tablets) each day following., Disp: 90 tablet, Rfl: 0 .  VOLTAREN 1 % GEL, Apply 1 application topically daily as needed (hand pain). , Disp: , Rfl:      Review of Systems     Objective:   Physical Exam  Constitutional: She is oriented to person, place, and time. She appears well-developed and well-nourished. No distress.  Cushingoid face  HENT:  Head: Normocephalic  and atraumatic.  Right Ear: External ear normal.  Left Ear: External ear normal.  Mouth/Throat: Oropharynx is clear and moist. No oropharyngeal exudate.  Eyes: Pupils are equal, round, and reactive to light. Conjunctivae and EOM are normal. Right eye exhibits no discharge. Left eye exhibits no discharge. No scleral icterus.  Neck: Normal range of motion. Neck supple. No JVD present. No tracheal deviation present. No thyromegaly present.  Cardiovascular: Normal rate, regular rhythm, normal heart sounds and intact distal pulses. Exam reveals no gallop and no friction rub.  No murmur heard. Pulmonary/Chest: Effort normal and breath sounds normal. No respiratory distress. She has no wheezes. She has no rales. She exhibits no tenderness.  No crackles  Abdominal: Soft. Bowel sounds are normal. She exhibits no distension and no mass. There is no tenderness. There is no rebound and no guarding.  Musculoskeletal: Normal range of motion. She exhibits no edema or tenderness.  Lymphadenopathy:    She has no cervical adenopathy.  Neurological: She is alert and oriented to person, place, and time. She has normal reflexes. No cranial nerve deficit. She exhibits normal muscle tone. Coordination normal.  Skin: Skin is warm and dry. No rash noted. She is not diaphoretic. No erythema. No pallor.  Psychiatric: She has a normal mood and affect. Her behavior is normal. Judgment and thought content normal.  Vitals reviewed.  Vitals:   03/07/18 1609  BP: 110/62  Pulse: (!) 54  SpO2: 98%  Weight: 120 lb (54.4 kg)  Height: 5' (1.524 m)        Assessment:       ICD-10-CM   1. ILD (interstitial lung disease) ([place]) J84.9   2. Hypersensitivity pneumonitis ([place]) J67.9   3. Adverse exposure in workplace Z56.9   4. Mold exposure Z77.120        Plan:      Glad you are feeling better - this is because you have been removed from mold exposure by being at home + prednisone we started In fact lung function has  improved   Plan - continue prednisone 43m daily for June 2019 -> move to prednisone 271mper day in July 2019 - you likely need prednisone for 3-12 months or maybe longer depending on course - can return to work as long as there is guarantee no mold exposure anymore at work site - talk to PCP Fu[place]anPA[place]bout strategies for protecting bone health while on chronic prednisone  Followup End July 2019 - do spirometry alone (no dlco, no lung volume, no post bD response) Return to see Dr RaChase Callern ILD clinic end July 2019    > 50% of this > 25 min visit spent in face to face counseling or coordination of care    Dr. MuBrand MalesM.D., F.[facility].P Pulmonary and Critical Care Medicine Staff Physician, CoMascoutahirector - Interstitial Lung Disease  Program  Pulmonary FiNemahat [id number]: [phone] no answer or between  15:00h - 7:00h: call [phone] Telephone: [phone]

## 2018-03-07 NOTE — Patient Instructions (Addendum)
ICD-10-CM   1. ILD (interstitial lung disease) (HCC) J84.9   2. Hypersensitivity pneumonitis (HCC) J67.9   3. Adverse exposure in workplace Z56.9   4. Mold exposure Z77.120     Glad you are feeling better - this is because you have been removed from mold exposure by being at home + prednisone we started In fact lung function has improved   Plan - continue prednisone 30mg  daily for June 2019 -> move to prednisone 20mg  per day in July 2019 - you likely need prednisone for 3-12 months or maybe longer depending on course - can return to work as long as there is guarantee no mold exposure anymore at work site - talk to PCP CameronFulbright, [place], [place] about strategies for protecting bone health while on chronic prednisone  Followup End July 2019 - do spirometry alone (no dlco, no lung volume, no post bD response) Return to see Dr Marchelle Gearingamaswamy in ILD clinic end July 2019

## 2018-03-22 ENCOUNTER — Telehealth: Payer: Self-pay | Admitting: Internal Medicine

## 2018-03-22 NOTE — Telephone Encounter (Signed)
Rec'd fax from [employer] wanting to know if patient can return to work and includes a form for Dr. Marchelle Gearingamaswamy to complete - fwd to [employer] via interoffice mail -pr

## 2018-04-25 ENCOUNTER — Ambulatory Visit (INDEPENDENT_AMBULATORY_CARE_PROVIDER_SITE_OTHER): Payer: 59 | Admitting: Internal Medicine

## 2018-04-25 ENCOUNTER — Encounter: Payer: Self-pay | Admitting: Internal Medicine

## 2018-04-25 VITALS — BP 130/72 | HR 61 | Ht 60.0 in | Wt 125.2 lb

## 2018-04-25 DIAGNOSIS — Z569 Unspecified problems related to employment: Secondary | ICD-10-CM

## 2018-04-25 DIAGNOSIS — J679 Hypersensitivity pneumonitis due to unspecified organic dust: Secondary | ICD-10-CM

## 2018-04-25 DIAGNOSIS — J849 Interstitial pulmonary disease, unspecified: Secondary | ICD-10-CM

## 2018-04-25 LAB — PULMONARY FUNCTION TEST
DL/VA % pred: 91 %
DL/VA: 3.87 ml/min/mmHg/L
DLCO UNC: 14.72 ml/min/mmHg
DLCO unc % pred: 78 %
FEF 25-75 Pre: 2.67 L/sec
FEF2575-%PRED-PRE: 128 %
FEV1-%PRED-PRE: 100 %
FEV1-Pre: 2.17 L
FEV1FVC-%PRED-PRE: 108 %
FEV6-%PRED-PRE: 94 %
FEV6-PRE: 2.56 L
FEV6FVC-%Pred-Pre: 104 %
FVC-%PRED-PRE: 91 %
FVC-PRE: 2.56 L
PRE FEV6/FVC RATIO: 100 %
Pre FEV1/FVC ratio: 84 %

## 2018-04-25 MED ORDER — PREDNISONE 10 MG PO TABS: 20.0000 mg | ORAL_TABLET | Freq: Every day | ORAL | 1 refills | Status: DC

## 2018-04-25 NOTE — Progress Notes (Signed)
Spirometry and Dlco done today. 

## 2018-04-25 NOTE — Progress Notes (Signed)
Subjective:     Patient ID: Hannah [facility], female   DOB: 08-08-1956, 62 y.o.   MRN: [id number]  HPI  OV 11/10/2017  Chief Complaint  Patient presents with  . Advice Only    Referred by Hannah Griffon, PA. Pt was in hosp. 1/4-1/10 for acute resp failure with hypoxia but states that she was mainly in there because of heart problems.  Pt does currently have a dry cough and states she is SOB today.     Hannah Colon presents for follow-up from the Colon.  She was admitted with acute hypoxemic respiratory failure with diffuse groundglass opacities.  She had his ground glass opacities on the CT scan in January 2019 but also towards the end of 2018.  Therefore interstitial lung disease was suspected.  She underwent autoimmune workup but this was negative.  At that time we did an echocardiogram and it showed severe hypertrophic obstructive cardiomyopathy and with the treatment of this hypoxemia improved and a chest x-ray improved.  But given the chronicity of the chest x-ray findings the presence of ILD was still entertained.  Therefore she presents for follow-up.  Currently she is improved.  She is able to do her bed.  She sees Dr. Stanford Colon for cardiology follow-up.  Her metoprolol has been increased.  She is slowly gaining her strength.  She is doing activities of daily living such as making the bed.  She still can get short of breath when she exerts.  Today when I exerted her she did not desaturate to significance but she did drop below 3 points suggesting the presence of interstitial infiltrates.  The exam did show crackles.  Walking desaturation test on 11/10/2017 185 feet x 3 laps on ROOM AIR:  did not desaturate. Rest pulse ox was 99%, final pulse ox was 91%. HR response 76/min at rest to 84/min at peak exertion. Patient Hannah [facility]  Did not Desaturate < 88% . Hannah [facility] did yes  Desaturated </= 3% points. Hannah Colon did not get Hannah Colon note 12/31/17 los 3 days. Main  issue now is severe cough despiote teassoln perles. Had RHC and PcWP 10. Bronch with BAL and TTBx planned Monday 01/02/18 by Dr Lamonte Sakai.    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  I discoveredt his only now. Last visit Feb 2019 and in Jan 2019 she denied mold exposure. 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 Says her office room  has had significant mold all along and in 2018 they cleaned it out while she was still working in office. Last worked there dec 2018. 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 Range 01/02/2018 14:54  Color, Fluid Latest Ref Range: YELLOW  COLORLESS (A)  WBC, Fluid Latest Ref Range: 0 - 1,000 cu mm 678  Lymphs, Fluid Latest Units: % 70  Eos, Fluid Latest Units: % 10  Appearance, Fluid Latest Ref Range: CLEAR  HAZY (A)  Other Cells, Fluid Latest Units: % MESOTHELIAL CELLS...  Neutrophil Count, Fluid Latest Ref Range: 0 - 25 % 14  Monocyte-Macrophage-Serous Fluid Latest Ref Range: 50 - 90 % 6 (L)    CD4: CD8 ratio 2:1   OV 01/24/2018  Chief Complaint  Patient presents with  . Follow-up    Hospitilized 3/27 for SOB. Dry cough following PFT done yesterday. Overall she reports she has been ok.    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  This is in the setting of hypertrophic obstructive cardiomyopathy.  Symptom of severe cough  On prednisone 41m per day x 3 weeks. Cough resolved with this. YHesterday had PFT that shows restriction - and cough relapsed a bit. Husband here with her. They want to go over diagnosis. OVerall feeling well. No new issues. Wants to return to work.       Walking desaturation test on 01/24/2018 185 feet x 3 laps on ROOM AIR:  did dd not desaturate. Rest pulse ox was 97%, final pulse ox was 95%. HR response 79/min at rest to 87/min at peak exertion. Patient [facility] Did not Desaturate < 88% . BBeatrix Fettersdid not  Desaturated </= 3% points. Hannah Uedadid not get tachyardic   OV 03/07/2018  Chief Complaint  Patient presents with  . Follow-up    Pt states she is doing better since last visit. States she has days where fatigue is worse but other than that, she has been doing good.    Followup    ICD-10-CM   1. ILD (interstitial lung disease) (HCC) J84.9 Pulmonary function test  2. Hypersensitivity pneumonitis ([place] J67.9   3. Adverse exposure in workplace Z56.9   4. Mold exposure Z77.120     She is now finished prednisone 466mper day and since few days ago on 3057mrednisone per day. Witht his her breathing is improved but she has gained weight. PFTs show improvement to near normalization. She tells me she is still off work. Her employer is fixing mold exposure and apparently recent recheck still showed mold. There is an old building and new building. She is knowing how and under what circumstances she can go back to work and how long to take prednisone    OV 04/25/2018  Chief Complaint  Patient presents with  . Follow-up    PFT performed today.  Pt states she has been doing okay since last visit. States itching has been better since pred was lowered and insomnia is not as bad but pt states she does not have as much  energy as she used to. States she does have some occ worsening SOB and also has some occ CP.    Follow-up interstitial lung disease due to hypersensitivity pneumonitis following mold exposure at the workplace  She is currently on 30 mg prednisone. The dyspnea is improved. She says however when she cut down from 40 to 30 mg prednisone she started feeling a little bit more fatigued. She still not return to work. Lung function today shows near total normalization but while on prednisone. She's avoided exposure to mold at the workplace which is critical. History retake again suggests there is no mold exposure at the home and she continues  to improve while she is at the home apartment currently  She did bring in her allergy workup from [facility] - . Looks like is serum . In April 2016 -> she was negative for mold x 11 typles including aureobasidii but in July 2019 she was positive for alternaria and aspergillus      Results for Hannah [facility], Hannah Colon (MRN [id number]) as of 04/25/2018 11:29  Ref. Range 01/23/2018 15:25 02/23/2018 10:07 04/25/2018 10:27  FVC-Pre Latest Units: L 1.78 2.19 2.56  FVC-%Pred-Pre Latest Units: % 63 77 91   Results for Hannah [facility], Hannah Colon (MRN [id number]) as of 04/25/2018 11:29  Ref. Range 01/23/2018 15:25 02/23/2018 10:07 04/25/2018 10:27  DLCO unc Latest Units: ml/min/mmHg 12.99 13.82 14.72  DLCO unc % pred Latest Units: % 68 73 78      has a past medical history of Abnormal pulmonary finding, Anemia of chronic disease, Chronic diastolic CHF (congestive heart failure) ([place]), Environmental and seasonal allergies, High cholesterol, Hypertension, and Hypertensive heart disease (05/13/2017).   reports that she has never smoked. She has never used smokeless tobacco.  Past Surgical History:  Procedure Laterality Date  . ABDOMINAL HYSTERECTOMY    . FINGER SURGERY Right 11/22/2017  . RIGHT HEART CATH N/A 12/30/2017   Procedure: RIGHT HEART CATH;  Surgeon: Jolaine Artist, MD;   Location: [facility];  Service: Cardiovascular;  Laterality: N/A;  . SHOULDER ARTHROSCOPY Right   . VIDEO BRONCHOSCOPY Bilateral 01/02/2018   Procedure: VIDEO BRONCHOSCOPY WITH FLUORO;  Surgeon: Marshell Garfinkel, MD;  Location: [facility];  Service: Cardiopulmonary;  Laterality: Bilateral;    Allergies  Allergen Reactions  . Amoxicillin-Pot Clavulanate Rash  . Iodinated Diagnostic Agents Anaphylaxis    hypotension hypotension  . Molds & Smuts Shortness Of Breath  . Peanut Oil Swelling  . Penicillins Hives and Rash    Has patient had a PCN reaction causing immediate rash, facial/tongue/throat swelling, SOB or lightheadedness with hypotension: No Has patient had a PCN reaction causing severe rash involving mucus membranes or skin necrosis: No Has patient had a PCN reaction that required hospitalization: No Has patient had a PCN reaction occurring within the last 10 years: Yes If all of the above answers are "NO", then may proceed with Cephalosporin use. Tolerates Cefepime 10/2017.   Marland Kitchen Metrizamide Other (See Comments)    Hypotension - iodinated contrast agents     There is no immunization history on file for this patient.  Family History  Problem Relation Age of Onset  . Dementia Mother   . Asthma Maternal Grandmother   . Heart failure Maternal Uncle      Current Outpatient Medications:  .  acetaminophen (TYLENOL) 500 MG tablet, Take 1,000 mg by mouth every 6 (six) hours as needed for mild pain, moderate pain or headache., Disp: , Rfl:  .  aspirin EC 81 MG EC tablet, Take 1 tablet (81 mg total) by mouth daily., Disp: 30 tablet, Rfl: 0 .  atorvastatin (LIPITOR) 20 MG tablet, Take 20 mg by mouth daily after breakfast. , Disp: , Rfl:  .  azelastine (ASTELIN) 0.1 % nasal spray, Place 1 spray into both nostrils 2 (two) times daily., Disp: , Rfl:  .  fluticasone (FLONASE) 50 MCG/ACT nasal spray, Place 1 spray into both nostrils daily., Disp: , Rfl:  .  furosemide (LASIX) 20 MG  tablet, Take 1 tablet (20 mg total) by mouth 2 (two) times daily as needed., Disp: 30 tablet, Rfl: 0 .  metoprolol succinate (TOPROL-XL) 50 MG  24 hr tablet, Take 100 mg by mouth daily. Take with or immediately following a meal. , Disp: , Rfl:  .  Multiple Vitamin (MULTIVITAMIN WITH MINERALS) TABS tablet, Take 1 tablet by mouth daily., Disp: , Rfl:  .  OVER THE COUNTER MEDICATION, Place 3 drops under the tongue daily. Allergy drops made up at Dr office, Disp: , Rfl:  .  potassium chloride (K-DUR) 10 MEQ tablet, Take 1 tablet (10 mEq total) by mouth daily. Take along with lasix, Disp: 30 tablet, Rfl: 2 .  predniSONE (DELTASONE) 10 MG tablet, Take 30 mg by mouth daily with breakfast., Disp: , Rfl:  .  VOLTAREN 1 % GEL, Apply 1 application topically daily as needed (hand pain). , Disp: , Rfl:  .  Calcium Carb-Cholecalciferol (CALCIUM + VITAMIN D3 PO), Take by mouth., Disp: , Rfl:  .  calcium carbonate (TUMS - DOSED IN MG ELEMENTAL CALCIUM) 500 MG chewable tablet, Chew 1 tablet by mouth 2 (two) times daily as needed for indigestion or heartburn., Disp: , Rfl:    Review of Systems     Objective:   Physical Exam  Constitutional: She is oriented to person, place, and time. She appears well-developed and well-nourished. No distress.  HENT:  Head: Normocephalic and atraumatic.  Right Ear: External ear normal.  Left Ear: External ear normal.  Mouth/Throat: Oropharynx is clear and moist. No oropharyngeal exudate.  Eyes: Pupils are equal, round, and reactive to light. Conjunctivae and EOM are normal. Right eye exhibits no discharge. Left eye exhibits no discharge. No scleral icterus.  Neck: Normal range of motion. Neck supple. No JVD present. No tracheal deviation present. No thyromegaly present.  Cardiovascular: Normal rate, regular rhythm, normal heart sounds and intact distal pulses. Exam reveals no gallop and no friction rub.  No murmur heard. Pulmonary/Chest: Effort normal and breath sounds normal.  No respiratory distress. She has no wheezes. She has no rales. She exhibits no tenderness.  Abdominal: Soft. Bowel sounds are normal. She exhibits no distension and no mass. There is no tenderness. There is no rebound and no guarding.  Musculoskeletal: Normal range of motion. She exhibits no edema or tenderness.  Lymphadenopathy:    She has no cervical adenopathy.  Neurological: She is alert and oriented to person, place, and time. She has normal reflexes. No cranial nerve deficit. She exhibits normal muscle tone. Coordination normal.  Skin: Skin is warm and dry. No rash noted. She is not diaphoretic. No erythema. No pallor.  Psychiatric: She has a normal mood and affect. Her behavior is normal. Judgment and thought content normal.  Vitals reviewed.  Vitals:   04/25/18 1056  BP: 130/72  Pulse: 61  SpO2: 97%  Weight: 125 lb 3.2 oz (56.8 kg)  Height: 5' (1.524 m)    Estimated body mass index is 24.45 kg/m as calculated from the following:   Height as of this encounter: 5' (1.524 m).   Weight as of this encounter: 125 lb 3.2 oz (56.8 kg).     Assessment:       ICD-10-CM   1. Hypersensitivity pneumonitis ([place]) J67.9   2. Adverse exposure in workplace Z56.9        Plan:      You are better Lung function normalizing - this is because of prednisone and 100% avoidance of exposure at workplace to mold No evidence clinically of exposure to mold at locations other than work  Plan - reduce prednisone to 18m per day from 342m Per day -  100% avoidance of mold exposure needed  Followup 2 months do sprirometry and dlco Return to ILD clinic in 2 months or sooner if needed   > 50% of this > 25 min visit spent in face to face counseling or coordination of care - by this undersigned MD - Dr Brand Males. This includes one or more of the following documented above: discussion of test results, diagnostic or treatment recommendations, prognosis, risks and benefits of management  options, instructions, education, compliance or risk-factor reduction   Dr. Brand Males, M.D., [facility].C.P Pulmonary and Critical Care Medicine Staff Physician, [place] Director - Interstitial Lung Disease  Program  P[facility] [place] [facility] [place], [place], [address]  Pager: [phone], If no answer or between  15:00h - 7:00h: call [phone] Telephone: [phone]

## 2018-04-25 NOTE — Patient Instructions (Signed)
ICD-10-CM   1. 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 Adverse exposure in workplace Z56.9     You are better Lung function normalizing - this is because of prednisone and 100% avoidance of exposure at workplace to mold  Plan - reduce prednisone to 20mg  per day from 30mg   Per day - 100% avoidance of mold exposure needed  Followup 2 months do sprirometry and dlco Return to ILD clinic in 2 months or sooner if needed

## 2018-04-27 ENCOUNTER — Encounter: Payer: Self-pay | Admitting: Internal Medicine

## 2018-05-23 ENCOUNTER — Telehealth: Payer: Self-pay | Admitting: Cardiology

## 2018-05-23 NOTE — Telephone Encounter (Signed)
Spoke with pt. Pt sts that she has been having onging chest pain in the center of her chest for 2-3 days.  Pt sts that she has been having associated episodes of sob. Today she had two episodes of diaphoresis. Some nausea, no vomiting. Pt sts that she is having active chest pain now. Adv pt that she needs to go to the ED asap for further evaluation. Pt sts that she will have someone drive her now.

## 2018-05-23 NOTE — Telephone Encounter (Signed)
New Message       Pt c/o of Chest Pain: 1. Are you having CP right now? Yes 2. Are you experiencing any other symptoms (ex. SOB, nausea, vomiting, sweating)? SOB/Sweating 3. How long have you been experiencing CP? 2 to 3 days 4. Is your CP continuous or coming and going? Ongoing today 5. Have you taken Nitroglycerin? No

## 2018-05-25 ENCOUNTER — Telehealth: Payer: Self-pay | Admitting: Cardiology

## 2018-05-25 NOTE — Telephone Encounter (Signed)
Left message for pt to call.

## 2018-05-25 NOTE — Telephone Encounter (Signed)
Follow Up:    Pt wants to give you an update on her condition. Pt called on 05-23-18 with Chest Pains and was advised to go to the ER>Pt did not go to the ER that day.

## 2018-05-25 NOTE — Telephone Encounter (Signed)
Spoke with pt, she is feeling better today. She reports the day she called with the chest pain she did not go to the ER, she fell asleep and slept for 10 hours. She reports no more chest pain. She feels like it maybe related to the prednisone and the fact that she had to stop the prilosec. She has had similar symptoms from the prednisone. She has made her follow up 6 month appt and feels that is fine. She will call prior to appt with concerns.

## 2018-06-07 ENCOUNTER — Telehealth: Payer: Self-pay | Admitting: Internal Medicine

## 2018-06-07 DIAGNOSIS — J849 Interstitial pulmonary disease, unspecified: Secondary | ICD-10-CM

## 2018-06-07 MED ORDER — OMEPRAZOLE-SODIUM BICARBONATE 20-1100 MG PO CAPS: 1.0000 | ORAL_CAPSULE | Freq: Every day | ORAL | 0 refills | Status: AC

## 2018-06-07 NOTE — Telephone Encounter (Signed)
Pt is aware of below recommendations and voiced her understanding.  CXR has been ordered. Nothing further is needed.  

## 2018-06-07 NOTE — Telephone Encounter (Signed)
Pt is returning call. Cb is [phone].

## 2018-06-07 NOTE — Telephone Encounter (Signed)
Left message for patient to call back  

## 2018-06-07 NOTE — Telephone Encounter (Signed)
Called and spoke to pt.  Pt is requesting to have cxr prior to 06/29/18 OV, as has been experiencing intermittent chest discomfort. Pt also reports of sweats. Denied additional symptoms.   Pt stated she contacted cardiology, who suggested ED if chest discomfort worsens.   Pt is concerned that chest discomfort may be related to prednisone.   MR please advise. Thanks.

## 2018-06-07 NOTE — Telephone Encounter (Signed)
Ok to get cxr prio r to next visit Start OTC Zegerid 20mg  empty stomach and If helps we will know that prednisone causing gerd is causing this

## 2018-06-13 ENCOUNTER — Telehealth: Payer: Self-pay | Admitting: Cardiology

## 2018-06-13 NOTE — Progress Notes (Signed)
Cardiology Office Note   Date:  06/14/2018   ID:  Hannah Colon, DOB [birth year], MRN [id number]  PCP:  Burnis Medin PA-C  Cardiologist:  Jens Som  Chief Complaint  Patient presents with  . Chest Pain    LASTS 15-30MIN  . Cardiomyopathy    HOCM  . Congestive Heart Failure    Grade II   History of Present Illness: Hannah Colon is a 62 y.o. female who presents for complaints of chest pain. She called our office on 06/13/2018 with complaints of recurrent chest pain over the last 3 weeks. She has been seen by pulmonology, Dr. Marchelle Gearing and was started on prednisone. Zegerid was started due to GERD symptoms from prednisone. Despite the steroids, she continues to have intermittent chest pain. .   She has a history of HOCM and diastolic CHF. Last echocardiogram in 10/2017 revealed normal EF of 65%-70% with dynamic outflow obstruction with severe LVH, Grade II diastolic dysfunction, mild to moderate MR, severe LA enlargement and mild right atrial enlargement. Cardiac MRI 11/2017 consistent with HOCM. Holter monitor 10/2017 was normal without NSVT.   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    She states that most of the time she feels sharp substernal chest pain usually when her heart rate is elevated or when she is tired, or over does during the day. Rest often helps. She continues to have GERD symptoms as well.   Past Medical History:  Diagnosis Date  . Abnormal pulmonary finding    a. has been considered to have possible ILD.  Marland Kitchen Anemia of chronic disease   . Chronic diastolic CHF (congestive heart failure) (HCC)   . Environmental and seasonal allergies   . High cholesterol   . Hypertension   . Hypertensive heart disease 05/13/2017    Past Surgical History:  Procedure Laterality Date  . ABDOMINAL HYSTERECTOMY    . FINGER SURGERY Right 11/22/2017  . RIGHT HEART CATH N/A 12/30/2017   Procedure: RIGHT HEART CATH;   Surgeon: Dolores Patty, MD;  Location: [facility] INVASIVE CV LAB;  Service: Cardiovascular;  Laterality: N/A;  . SHOULDER ARTHROSCOPY Right   . VIDEO BRONCHOSCOPY Bilateral 01/02/2018   Procedure: VIDEO BRONCHOSCOPY WITH FLUORO;  Surgeon: Chilton Greathouse, MD;  Location: [facility] ENDOSCOPY;  Service: Cardiopulmonary;  Laterality: Bilateral;     Current Outpatient Medications  Medication Sig Dispense Refill  . acetaminophen (TYLENOL) 500 MG tablet Take 1,000 mg by mouth every 6 (six) hours as needed for mild pain, moderate pain or headache.    Marland Kitchen aspirin EC 81 MG EC tablet Take 1 tablet (81 mg total) by mouth daily. 30 tablet 0  . atorvastatin (LIPITOR) 20 MG tablet Take 40 mg by mouth daily after breakfast.    . azelastine (ASTELIN) 0.1 % nasal spray Place 1 spray into both nostrils 2 (two) times daily.    . Calcium Carb-Cholecalciferol (CALCIUM + VITAMIN D3 PO) Take by mouth.    . calcium carbonate (TUMS - DOSED IN MG ELEMENTAL CALCIUM) 500 MG chewable tablet Chew 1 tablet by mouth 2 (two) times daily as needed for indigestion or heartburn.    . fluticasone (FLONASE) 50 MCG/ACT nasal spray Place 1 spray into both nostrils daily.    . furosemide (LASIX) 20 MG tablet Take 1 tablet (20 mg total) by mouth 2 (two) times daily as needed. 30 tablet 0  . metoprolol succinate (TOPROL-XL) 50 MG 24 hr tablet Take 1 tablet (50 mg  total) by mouth 2 (two) times daily as needed (INCREASED HR OR SYSTOLIC >140). 40 tablet 1  . Multiple Vitamin (MULTIVITAMIN WITH MINERALS) TABS tablet Take 1 tablet by mouth daily.    Maxwell Caul Bicarbonate (ZEGERID OTC) 20-1100 MG CAPS capsule Take 1 capsule by mouth daily before breakfast. 28 each 0  . OVER THE COUNTER MEDICATION Place 3 drops under the tongue daily. Allergy drops made up at Dr office    . potassium chloride (K-DUR) 10 MEQ tablet Take 1 tablet (10 mEq total) by mouth daily. Take along with lasix 30 tablet 2  . predniSONE (DELTASONE) 10 MG tablet Take 2 tablets  (20 mg total) by mouth daily with breakfast. 60 tablet 1  . ranitidine (ZANTAC) 150 MG tablet Take 1 tablet (150 mg total) by mouth daily. 30 tablet 11  . VOLTAREN 1 % GEL Apply 1 application topically daily as needed (hand pain).      No current facility-administered medications for this visit.     Allergies:   Amoxicillin-pot clavulanate; Iodinated diagnostic agents; Molds & smuts; Peanut oil; Penicillins; and Metrizamide    Social History:  The patient  reports that she has never smoked. She has never used smokeless tobacco. She reports that she drinks about 4.0 - 5.0 standard drinks of alcohol per week. She reports that she does not use drugs.   Family History:  The patient's family history includes Asthma in her maternal grandmother; Dementia in her mother; Heart failure in her maternal uncle.    ROS: All other systems are reviewed and negative. Unless otherwise mentioned in H&P    PHYSICAL EXAM: VS:  BP (!) 148/72   Pulse 63   Ht 5' (1.524 m)   Wt 129 lb 3.2 oz (58.6 kg)   BMI 25.23 kg/m  , BMI Body mass index is 25.23 kg/m. GEN: Well nourished, well developed, in no acute distress  HEENT: normal  Neck: no JVD, carotid bruits, or masses Cardiac: RRR; no murmurs, rubs, or gallops,no edema  Respiratory: Clear to auscultation bilaterally, normal work of breathing GI: soft, nontender, nondistended, + BS Negative Murphy's sign  MS: no deformity or atrophy  Skin: warm and dry, no rash Neuro:  Strength and sensation are intact Psych: euthymic mood, full affect   EKG: NSR possible left atrial enlargement, with LVH.   Recent Labs: 10/03/2017: TSH 2.477 10/11/2017: Magnesium 1.9 12/28/2017: ALT 14 12/29/2017: B Natriuretic Peptide 77.6 01/03/2018: BUN 13; Creatinine, Ser 0.63; Potassium 4.3; Sodium 137 01/04/2018: Hemoglobin 11.9; Platelets 472    Lipid Panel    Component Value Date/Time   CHOL 173 05/13/2017 0121   TRIG 275 (H) 05/13/2017 0121   HDL 48 05/13/2017 0121    CHOLHDL 3.6 05/13/2017 0121   VLDL 55 (H) 05/13/2017 0121   LDLCALC 70 05/13/2017 0121      Wt Readings from Last 3 Encounters:  06/14/18 129 lb 3.2 oz (58.6 kg)  04/25/18 125 lb 3.2 oz (56.8 kg)  03/07/18 120 lb (54.4 kg)      Other studies Reviewed: Study Highlights 11/02/2017    Blood pressure demonstrated a normal response to exercise.  There was no ST segment deviation noted during stress.  No T wave inversion was noted during stress.   Patient did not achieve 85% max predicted heartrate, therefore the study is inconclusive. The HR was attenuated due to medication. Consider pharmacologic nuclear stress test or CT coronary angiogram as alternative ischemia evaluations.   Right Heart Cardiac Cath  Conclusion  Findings:  RA = 2 RV = 37/4 PA = 37/13 (23) PCW = 10 Fick cardiac output/index = 6.1/4.0 PVR = 2.2 WU FA sat = 91% PA sat = 70%, 70% SVC sat = 64%  Assessment: 1. Relatively normal R/L heart pressures with normal PCWP and cardiac output 2. No evidence of intracardiac shunting    ASSESSMENT AND PLAN:  1. HOCM: She has chest pain when she exerts herself of "over does it" and HR goes up. Once she rests and HR goes down she feels better. She remains on metoprolol 100 mg daily. She is advised to take additional 25 mg prn for rapid HR. She has some of the lower dose metoprolol at home to use. I have asked her pace herself and rest in between activities to prevent elevated HR.   She was going to be seen by Dr. Modesto Charon at [facility] for HOCM evaluation and recommendations for further treatment but this was postponed when she was diagnosed with lung disease. Will discuss with Dr. Jens Som need for re-referral now that she is being treated by pulmonary.  2. Hypertension: BP is elevated today, but home BP's are much lower. She is advised to make sure she keeps BP under control and avoid salt. She is keeping a record of her daily salt intake.   3 GERD symptoms: She is on  Zegerid for symptoms. I have also suggested H2 blocker for further GERD symptom control, such and Zantac or Pepcid. Consider GB evaluation if symptoms persist. She is to discuss this with her PCP.   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  Over 40 minutes spent with this patient answering multiple questions.   Labs/ tests ordered today include: None   Bettey Mare. Liborio Nixon, ANP, AACC   06/14/2018 9:22 AM    [facility] [address] [place], [place] [address] Phone: [phone]; Fax: [phone]

## 2018-06-13 NOTE — Telephone Encounter (Signed)
Thank you :)

## 2018-06-13 NOTE — Telephone Encounter (Signed)
Received call from patient-patient states she has been experiencing episodes of chest pain x 3 weeks.  She went to see Dr. Marchelle Gearing and was started on prednisone which she is still taking, then was started on Zegerid as well as they thought the prednisone may have been causing GERD.   She states she continues to have episodes of chest pain.   Pain is in the center of her chest, does not radiate.  Had episode this AM, reports lasting 10 mins then resolved.   No SOB, N/V.  States she has had episodes of diaphoresis.  Pain does not correlate with exertion, can happen at rest.    No pain at current.     appt made for tomorrow 9/11 at 8AM with Harriet Pho DNP at [facility].  Patient is aware and verbalized understanding.  Advised if symptoms return or worsen, proceed to ER for evaluation.

## 2018-06-13 NOTE — Telephone Encounter (Signed)
Pt c/o of Chest Pain: STAT if CP now or developed within 24 hours  1. Are you having CP right now? No- just a few minutes ago  2. Are you experiencing any other symptoms (ex. SOB, nausea, vomiting, sweating)? Last night sweating some  3. How long have you been experiencing CP?  About 3 weeks ago  4. Is your CP continuous or coming and going? offf and on  5. Have you taken Nitroglycerin? no ?

## 2018-06-14 ENCOUNTER — Ambulatory Visit (INDEPENDENT_AMBULATORY_CARE_PROVIDER_SITE_OTHER): Payer: 59 | Admitting: Adult Health

## 2018-06-14 ENCOUNTER — Encounter: Payer: Self-pay | Admitting: Adult Health

## 2018-06-14 VITALS — BP 148/72 | HR 63 | Ht 60.0 in | Wt 129.2 lb

## 2018-06-14 DIAGNOSIS — I10 Essential (primary) hypertension: Secondary | ICD-10-CM | POA: Diagnosis not present

## 2018-06-14 DIAGNOSIS — R079 Chest pain, unspecified: Secondary | ICD-10-CM

## 2018-06-14 DIAGNOSIS — I421 Obstructive hypertrophic cardiomyopathy: Secondary | ICD-10-CM | POA: Diagnosis not present

## 2018-06-14 DIAGNOSIS — K219 Gastro-esophageal reflux disease without esophagitis: Secondary | ICD-10-CM

## 2018-06-14 DIAGNOSIS — J984 Other disorders of lung: Secondary | ICD-10-CM | POA: Diagnosis not present

## 2018-06-14 MED ORDER — RANITIDINE HCL 150 MG PO TABS: 150.0000 mg | ORAL_TABLET | Freq: Every day | ORAL | 11 refills | Status: DC

## 2018-06-14 MED ORDER — METOPROLOL SUCCINATE ER 50 MG PO TB24: 100.0000 mg | ORAL_TABLET | Freq: Two times a day (BID) | ORAL | 1 refills | Status: DC | PRN

## 2018-06-14 MED ORDER — RANITIDINE HCL 150 MG PO TABS: 150.0000 mg | ORAL_TABLET | Freq: Two times a day (BID) | ORAL | 11 refills | Status: DC

## 2018-06-14 MED ORDER — METOPROLOL SUCCINATE ER 50 MG PO TB24: 50.0000 mg | ORAL_TABLET | Freq: Two times a day (BID) | ORAL | 1 refills | Status: DC | PRN

## 2018-06-14 NOTE — Patient Instructions (Signed)
Medication Instructions:  MAY TAKE EXTRA METOPROLOL WITH INCREASED HR-OR-SBP >140 If you need a refill on your cardiac medications before your next appointment, please call your pharmacy.  Special Instructions: PACE YOURSELF WITH EXERTION  Follow-Up: Your physician wants you to follow-up in: KEEP SCHEDULED DR CRENSHAW APPT.   Thank you for choosing [facility] at [facility]!!

## 2018-06-21 ENCOUNTER — Telehealth: Payer: Self-pay | Admitting: Internal Medicine

## 2018-06-21 ENCOUNTER — Telehealth: Payer: Self-pay | Admitting: Cardiology

## 2018-06-21 MED ORDER — PREDNISONE 10 MG PO TABS: 20.0000 mg | ORAL_TABLET | Freq: Every day | ORAL | 1 refills | Status: AC

## 2018-06-21 NOTE — Telephone Encounter (Signed)
New Message:    Pt said her last office visit they added 50 mg of Metoprolol to her medicine list, but they took the 100 mg of Metoprolol off. She needs you to add  Her 100 mg of Metoprolol back to her list please.

## 2018-06-21 NOTE — Telephone Encounter (Signed)
Returned pt's call. [email or web address] 

## 2018-06-21 NOTE — Telephone Encounter (Signed)
Per MR last AVS patient is to take 20 mg of prednisone daily. Prescription has been sent in. Nothing further needed.

## 2018-06-22 MED ORDER — METOPROLOL SUCCINATE ER 100 MG PO TB24: 100.0000 mg | ORAL_TABLET | Freq: Every day | ORAL | Status: DC

## 2018-06-22 MED ORDER — METOPROLOL SUCCINATE ER 50 MG PO TB24: 100.0000 mg | ORAL_TABLET | Freq: Every day | ORAL | Status: DC

## 2018-06-22 NOTE — Telephone Encounter (Signed)
Spoke with pt. Pt that when she was seen by Joni ReiningKathryn Lawrence, NP on 9/11 her Metoprolol dosage was changed incorrectly.  She has always taken Metoprolol XL 100mg  daily not prn. Samara DeistKathryn wanted her to take an additional 50mg  daily for breakthrough for increased HR or SBP >140.  She would like her med list updated accordingly.  Adv her that I would update the Metoprolol 100mg  daily and send a message to Samara DeistKathryn to confirm the additional 50mg  daily prn.

## 2018-06-22 NOTE — Telephone Encounter (Signed)
No changes in instructions for now. Thank you for following up.

## 2018-06-22 NOTE — Telephone Encounter (Signed)
Tried to CB pt, LMVM with metoprolol directions-She remains on metoprolol 100 mg daily(we have rx 2-50mg  tab so she can take 1/2 tab for the extra 25mg  that is PRN). She is advised to take additional 25 mg prn for rapid HR. Lm2cb if anything else is needed.

## 2018-06-22 NOTE — Telephone Encounter (Signed)
Follow up  ° ° °Patient is returning your call. °

## 2018-06-26 ENCOUNTER — Telehealth: Payer: Self-pay | Admitting: Internal Medicine

## 2018-06-26 NOTE — Telephone Encounter (Signed)
Rec'd faxed LTD forms from [employer] - Fwd to [employer] via [employer] -pr

## 2018-06-27 ENCOUNTER — Ambulatory Visit: Payer: 59 | Admitting: Internal Medicine

## 2018-06-29 ENCOUNTER — Encounter: Payer: Self-pay | Admitting: Internal Medicine

## 2018-06-29 ENCOUNTER — Ambulatory Visit (INDEPENDENT_AMBULATORY_CARE_PROVIDER_SITE_OTHER): Payer: 59 | Admitting: Internal Medicine

## 2018-06-29 ENCOUNTER — Ambulatory Visit (INDEPENDENT_AMBULATORY_CARE_PROVIDER_SITE_OTHER)
Admission: RE | Admit: 2018-06-29 | Discharge: 2018-06-29 | Disposition: A | Discharge status: Home / Self Care | Payer: 59 | Source: Ambulatory Visit | Attending: Internal Medicine | Admitting: Internal Medicine

## 2018-06-29 VITALS — BP 144/80 | HR 61 | Ht 59.0 in | Wt 131.0 lb

## 2018-06-29 DIAGNOSIS — Z569 Unspecified problems related to employment: Secondary | ICD-10-CM

## 2018-06-29 DIAGNOSIS — Z77120 Contact with and (suspected) exposure to mold (toxic): Secondary | ICD-10-CM | POA: Diagnosis not present

## 2018-06-29 DIAGNOSIS — Z23 Encounter for immunization: Secondary | ICD-10-CM

## 2018-06-29 DIAGNOSIS — J849 Interstitial pulmonary disease, unspecified: Secondary | ICD-10-CM

## 2018-06-29 DIAGNOSIS — J679 Hypersensitivity pneumonitis due to unspecified organic dust: Secondary | ICD-10-CM | POA: Diagnosis not present

## 2018-06-29 LAB — PULMONARY FUNCTION TEST
DL/VA % pred: 111 %
DL/VA: 4.54 ml/min/mmHg/L
DLCO unc % pred: 102 %
DLCO unc: 17.93 ml/min/mmHg
FEF 25-75 Pre: 2.47 L/sec
FEF2575-%Pred-Pre: 122 %
FEV1-%PRED-PRE: 101 %
FEV1-PRE: 2.11 L
FEV1FVC-%Pred-Pre: 107 %
FEV6-%Pred-Pre: 97 %
FEV6-Pre: 2.52 L
FEV6FVC-%Pred-Pre: 104 %
FVC-%PRED-PRE: 93 %
FVC-Pre: 2.52 L
Pre FEV1/FVC ratio: 84 %
Pre FEV6/FVC Ratio: 100 %

## 2018-06-29 NOTE — Addendum Note (Signed)
Addended by: Cydney Ok on: 06/29/2018 10:36 AM   Modules accepted: Orders

## 2018-06-29 NOTE — Progress Notes (Signed)
OV 11/10/2017  Chief Complaint  Patient presents with  . Advice Only    Referred by Marni Griffon, PA. Pt was in hosp. 1/4-1/10 for acute resp failure with hypoxia but states that she was mainly in there because of heart problems.  Pt does currently have a dry cough and states she is SOB today.     Hannah Colon presents for follow-up from the hospital.  She was admitted with acute hypoxemic respiratory failure with diffuse groundglass opacities.  She had his ground glass opacities on the CT scan in January 2019 but also towards the end of 2018.  Therefore interstitial lung disease was suspected.  She underwent autoimmune workup but this was negative.  At that time we did an echocardiogram and it showed severe hypertrophic obstructive cardiomyopathy and with the treatment of this hypoxemia improved and a chest x-ray improved.  But given the chronicity of the chest x-ray findings the presence of ILD was still entertained.  Therefore she presents for follow-up.  Currently she is improved.  She is able to do her bed.  She sees Dr. Stanford Breed for cardiology follow-up.  Her metoprolol has been increased.  She is slowly gaining her strength.  She is doing activities of daily living such as making the bed.  She still can get short of breath when she exerts.  Today when I exerted her she did not desaturate to significance but she did drop below 3 points suggesting the presence of interstitial infiltrates.  The exam did show crackles.  Walking desaturation test on 11/10/2017 185 feet x 3 laps on ROOM AIR:  did not desaturate. Rest pulse ox was 99%, final pulse ox was 91%. HR response 76/min at rest to 84/min at peak exertion. Patient Hannah Colon  Did not Desaturate < 88% . Hannah Colon did yes  Desaturated </= 3% points. Hannah Colon did not get [facility] note 12/31/17 los 3 days. Main issue now is severe cough despiote teassoln perles. Had RHC and PcWP 10. Bronch with BAL and TTBx  planned Monday 01/02/18 by Dr Lamonte Sakai.    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  I discoveredt his only now. Last visit Feb 2019 and in Jan 2019 she denied mold exposure. 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 Says her office room  has had significant mold all along and in 2018 they cleaned it out while she was still working in office. Last worked there dec 2018. 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 Range 01/02/2018 14:54  Color, Fluid Latest Ref Range: YELLOW  COLORLESS (A)  WBC, Fluid Latest Ref Range: 0 - 1,000 cu mm 678  Lymphs, Fluid Latest Units: % 70  Eos, Fluid Latest Units: % 10  Appearance, Fluid Latest Ref Range: CLEAR  HAZY (A)  Other Cells, Fluid Latest Units: % MESOTHELIAL CELLS...  Neutrophil Count, Fluid Latest Ref Range: 0 - 25 % 14  Monocyte-Macrophage-Serous Fluid Latest Ref Range: 50 - 90 % 6 (L)    CD4: CD8 ratio 2:1   OV 01/24/2018  Chief Complaint  Patient presents with  . Follow-up    Hospitilized 3/27 for SOB. Dry cough following PFT done yesterday. Overall she reports she has been ok.    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  This is in the setting of hypertrophic  obstructive cardiomyopathy.  Symptom of severe cough  On prednisone 55m per day x 3 weeks. Cough resolved with this. YHesterday had PFT that shows restriction - and cough relapsed a bit. Husband here with her. They want to go over diagnosis. OVerall feeling well. No new issues. Wants to return to work.       Walking desaturation test on 01/24/2018 185 feet x 3 laps on ROOM AIR:  did dd not desaturate. Rest pulse ox was 97%, final pulse ox was 95%. HR response 79/min at rest to 87/min at peak exertion. Patient BMelvenia Colon Did not Desaturate < 88% . BBeatrix Fettersdid not  Desaturated </= 3% points. BMishelle Hassandid not get tachyardic   OV 03/07/2018  Chief Complaint  Patient presents with  . Follow-up    Pt states she is doing better since last visit. States she has days where fatigue is worse but other than that, she has been doing good.    Followup    ICD-10-CM   1. ILD (interstitial lung disease) (HCC) J84.9 Pulmonary function test  2. Hypersensitivity pneumonitis ([place] J67.9   3. Adverse exposure in workplace Z56.9   4. Mold exposure Z77.120     She is now finished prednisone 457mper day and since few days ago on 3024mrednisone per day. Witht his her breathing is improved but she has gained weight. PFTs show improvement to near normalization. She tells me she is still off work. Her employer is fixing mold exposure and apparently recent recheck still showed mold. There is an old building and new building. She is knowing how and under what circumstances she can go back to work and how long to take prednisone    OV 04/25/2018  Chief Complaint  Patient presents with  . Follow-up    PFT performed today.  Pt states she has been doing okay since last visit. States itching has been better since pred was lowered and insomnia is not as bad but pt states she does not have as much energy as she used to. States she does have some occ worsening SOB and also has some occ CP.     Follow-up interstitial lung disease due to hypersensitivity pneumonitis following mold exposure at the workplace  She is currently on 30 mg prednisone. The dyspnea is improved. She says however when she cut down from 40 to 30 mg prednisone she started feeling a little bit more fatigued. She still not return to work. Lung function today shows near total normalization but while on prednisone. She's avoided exposure to mold at the workplace which is critical. History retake again suggests there is no mold exposure at the home and she continues to improve while she is at the home apartment currently  She did bring in her allergy workup from  [facility] - . Looks like is serum . In April 2016 -> she was negative for mold x 11 typles including aureobasidii but in July 2019 she was positive for alternaria and aspergillus     OV 06/29/2018  Subjective:  Patient ID: Hannah Colon, female , DOB: 1956-05-23 , age 42 y.o. , MRN: [id number] , ADDRESS: [address] [place] [address]   06/29/2018 -   Chief Complaint  Patient presents with  . Follow-up    PFT performed today.  Pt states she was having chest pain 3 weeks ago and went to see her cardiologist and per cardiologist, they believe it might be coming from the prednisone so pt was prescribed a course of zegerid to see if that would help with the chest pains and even with that, she was still having chest pains and was then told to take zantac. Pt states she is still taking the prednisone and since the zantac was started 9/11, the chest pains has stopped.     HPI Fayth Trefry 62 y.o. -follow-up chronic hypersensitivity pneumonitis/interstitial lung disease secondary to mold exposure in the workplace  She presents with her husband as usual.  In terms of her respiratory status she is doing well.  She is currently on prednisone 20 mg/day.  But she is dealing with side effects of prednisone and she has gained weight.  In addition  she is having significant chest pain and heartburn that prompted her to go to the cardiologist.  Finally diagnosis acid reflux and required both H2 blockade and PPI to resolve the pains.  Today the lung function test is normal and walking desaturation test is normal.  These are documented below.  She has not had a flu shot.  She is somewhat reluctant to have a flu shot but after counseling she has agreed.  She does not have any egg allergy or fever.  It appears that a hypertrophic obstructive cardiomyopathy is under control.  We again discussed the unusual presentation she had in the intensive care unit.  It appears that she might have had hypertrophic obstructive cardiomyopathy all along but it was the hypersensitivity pneumonitis that precipitated this into a crisis and resulted in dual diagnosis in the early part of 2019     Results for IZEL, HOCHBERG (MRN [id number]) as of 06/29/2018 10:01  Ref. Range 01/23/2018 15:25 02/23/2018 10:07 04/25/2018 10:27 06/29/2018   FVC-Pre Latest Units: L 1.78 2.19 2.56 2/593%  FVC-%Pred-Pre Latest Units: % 63 77 91    Results for AMULYA, QUINTIN (MRN [id number]) as of 06/29/2018 10:01  Ref. Range 01/23/2018 15:25 02/23/2018 10:07 04/25/2018 10:27 06/29/2018   DLCO unc Latest Units: ml/min/mmHg 12.99 13.82 14.72 17.9  DLCO unc % pred Latest Units: % 68 73 78 102%    ROS - per HPI     has a past medical history of Abnormal pulmonary finding, Anemia of chronic disease, Chronic diastolic CHF (congestive heart failure) ([place]), Environmental and seasonal allergies, High cholesterol, Hypertension, and Hypertensive heart disease (05/13/2017).   reports that she has never smoked. She has never used smokeless tobacco.  Past Surgical History:  Procedure Laterality Date  . ABDOMINAL HYSTERECTOMY    . FINGER SURGERY Right 11/22/2017  . RIGHT HEART CATH N/A 12/30/2017   Procedure: RIGHT HEART CATH;  Surgeon: Jolaine Artist, MD;  Location: [facility];  Service:  Cardiovascular;  Laterality: N/A;  . SHOULDER ARTHROSCOPY Right   . VIDEO BRONCHOSCOPY Bilateral 01/02/2018  Procedure: VIDEO BRONCHOSCOPY WITH FLUORO;  Surgeon: Marshell Garfinkel, MD;  Location: [facility];  Service: Cardiopulmonary;  Laterality: Bilateral;    Allergies  Allergen Reactions  . Amoxicillin-Pot Clavulanate Rash  . Iodinated Diagnostic Agents Anaphylaxis    hypotension hypotension  . Molds & Smuts Shortness Of Breath  . Peanut Oil Swelling  . Penicillins Hives and Rash    Has patient had a PCN reaction causing immediate rash, facial/tongue/throat swelling, SOB or lightheadedness with hypotension: No Has patient had a PCN reaction causing severe rash involving mucus membranes or skin necrosis: No Has patient had a PCN reaction that required hospitalization: No Has patient had a PCN reaction occurring within the last 10 years: Yes If all of the above answers are "NO", then may proceed with Cephalosporin use. Tolerates Cefepime 10/2017.   Marland Kitchen Metrizamide Other (See Comments)    Hypotension - iodinated contrast agents     There is no immunization history on file for this patient.  Family History  Problem Relation Age of Onset  . Dementia Mother   . Asthma Maternal Grandmother   . Heart failure Maternal Uncle      Current Outpatient Medications:  .  acetaminophen (TYLENOL) 500 MG tablet, Take 1,000 mg by mouth every 6 (six) hours as needed for mild pain, moderate pain or headache., Disp: , Rfl:  .  aspirin EC 81 MG EC tablet, Take 1 tablet (81 mg total) by mouth daily., Disp: 30 tablet, Rfl: 0 .  atorvastatin (LIPITOR) 20 MG tablet, Take 40 mg by mouth daily after breakfast., Disp: , Rfl:  .  azelastine (ASTELIN) 0.1 % nasal spray, Place 1 spray into both nostrils 2 (two) times daily., Disp: , Rfl:  .  Calcium Carb-Cholecalciferol (CALCIUM + VITAMIN D3 PO), Take by mouth., Disp: , Rfl:  .  calcium carbonate (TUMS - DOSED IN MG ELEMENTAL CALCIUM) 500 MG chewable tablet,  Chew 1 tablet by mouth 2 (two) times daily as needed for indigestion or heartburn., Disp: , Rfl:  .  fluticasone (FLONASE) 50 MCG/ACT nasal spray, Place 1 spray into both nostrils daily., Disp: , Rfl:  .  furosemide (LASIX) 20 MG tablet, Take 1 tablet (20 mg total) by mouth 2 (two) times daily as needed., Disp: 30 tablet, Rfl: 0 .  metoprolol succinate (TOPROL-XL) 50 MG 24 hr tablet, Take 2 tablets (100 mg total) by mouth daily. May take xtra 30m (1/2 tab) for increased HR or SBP>140, Disp: , Rfl:  .  Multiple Vitamin (MULTIVITAMIN WITH MINERALS) TABS tablet, Take 1 tablet by mouth daily., Disp: , Rfl:  .  Omeprazole-Sodium Bicarbonate (ZEGERID OTC) 20-1100 MG CAPS capsule, Take 1 capsule by mouth daily before breakfast., Disp: 28 each, Rfl: 0 .  OVER THE COUNTER MEDICATION, Place 3 drops under the tongue daily. Allergy drops made up at Dr office, Disp: , Rfl:  .  potassium chloride (K-DUR) 10 MEQ tablet, Take 1 tablet (10 mEq total) by mouth daily. Take along with lasix, Disp: 30 tablet, Rfl: 2 .  predniSONE (DELTASONE) 10 MG tablet, Take 2 tablets (20 mg total) by mouth daily with breakfast., Disp: 60 tablet, Rfl: 1 .  ranitidine (ZANTAC) 150 MG tablet, Take 1 tablet (150 mg total) by mouth daily., Disp: 30 tablet, Rfl: 11 .  VOLTAREN 1 % GEL, Apply 1 application topically daily as needed (hand pain). , Disp: , Rfl:       Objective:   Vitals:   06/29/18 0947  BP: (!) 144/80  Pulse: 61  SpO2: 99%  Weight: 131 lb (59.4 kg)  Height: _0  (1.499 m)    Estimated body mass index is 26.46 kg/m as calculated from the following:   Height as of this encounter: _1  (1.499 m).   Weight as of this encounter: 131 lb (59.4 kg).  _2 @  [employer]   06/29/18 0947  Weight: 131 lb (59.4 kg)     Physical Exam  General Appearance:    Alert, cooperative, no distress, appears stated age - older , sitting on - chair , Deconditioned looking - mild and cushingoid  Head:     Normocephalic, without obvious abnormality, atraumatic  Eyes:    PERRL, conjunctiva/corneas clear,  Ears:    Normal TM's and external ear canals, both ears  Nose:   Nares normal, septum midline, mucosa normal, no drainage    or sinus tenderness. OXYGEN ON  - no . Patient is @ ra   Throat:   Lips, mucosa, and tongue normal; teeth and gums normal. Cyanosis on lips - no  Neck:   Supple, symmetrical, trachea midline, no adenopathy;    thyroid:  no enlargement/tenderness/nodules; no carotid   bruit or JVD  Back:     Symmetric, no curvature, ROM normal, no CVA tenderness  Lungs:     Distress - no , Wheeze no, Barrell Chest - no, Purse lip breathing - no, Crackles - no   Chest Wall:    No tenderness or deformity.   Heart:    Regular rate and rhythm, S1 and S2 normal, no rub   or gallop, Murmur - no  Breast Exam:    NOT DONE  Abdomen:     Soft, non-tender, bowel sounds active all four quadrants,    no masses, no organomegaly. Visceral obesity - yes  Genitalia:   NOT DONE  Rectal:   NOT DONE  Extremities:   Extremities normal, atraumatic, Clubbing - no, Edema - no  Pulses:   2+ and symmetric all extremities  Skin:   Stigmata of Connective Tissue Disease - no  Lymph nodes:   Cervical, supraclavicular, and axillary nodes normal  Psychiatric:  Neurologic:   YES - pleasant  CAm-ICU - neg, Alert and Oriented x 3 - yes, Moves all 4s - yes, Speech - normal, Cognition - intact           Assessment:       ICD-10-CM   1. Hypersensitivity pneumonitis ([place]) J67.9   2. ILD (interstitial lung disease) ([place]) J84.9   3. Adverse exposure in workplace Z56.9   4. Mold exposure Z77.120        Plan:     Patient Instructions     ICD-10-CM   1. Hypersensitivity pneumonitis ([place]) J67.9   2. ILD (interstitial lung disease) ([place]) J84.9   3. Adverse exposure in workplace Z56.9   4. Mold exposure Z77.120     Lung function normalized You are dealing with side effects of prednisone - heart burn,  weight gain etc.  Plan - flu shot 06/29/2018 (regular dose) - reduce prednisone to 48m per day and continue at this dose - continue to avoid mold  Followup - 2 months do spiromety and dlco  - return in 2 months to ILD clinic  > 50% of this > 25 min visit spent in face to face counseling or coordination of care - by this undersigned MD - Dr MBrand Males This includes one or more of the following documented above: discussion of test results, diagnostic or  treatment recommendations, prognosis, risks and benefits of management options, instructions, education, compliance or risk-factor reduction    SIGNATURE    Dr. Brand Males, M.D., F.C.C.P,  Pulmonary and Critical Care Medicine Staff Physician, [place] Director - Interstitial Lung Disease  Program  Pulmonary [place] at [place], [place], [address]  Pager: [phone], If no answer or between  15:00h - 7:00h: call [phone] Telephone: [phone]  10:16 AM 06/29/2018

## 2018-06-29 NOTE — Progress Notes (Signed)
PFT completed today 06/29/18.

## 2018-06-29 NOTE — Patient Instructions (Addendum)
ICD-10-CM   1. 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 ILD (interstitial lung disease) (HCC) J84.9   3. Adverse exposure in workplace Z56.9   4. Mold exposure Z77.120     Lung function normalized You are dealing with side effects of prednisone - heart burn, weight gain etc.  Plan - flu shot 06/29/2018 (regular dose) - reduce prednisone to 10mg  per day and continue at this dose - continue to avoid mold  Followup - 2 months do spiromety and dlco  - return in 2 months to ILD clinic

## 2018-07-31 ENCOUNTER — Telehealth: Payer: Self-pay | Admitting: Cardiology

## 2018-07-31 NOTE — Telephone Encounter (Signed)
Received a call from Fleet Contras with Dr.Novak's office calling to see if patient needs antibiotic before dental cleaning.Message sent to [facility] for advice.

## 2018-07-31 NOTE — Telephone Encounter (Signed)
No need for SBE prophylaxis /Brian Crenshaw  

## 2018-07-31 NOTE — Telephone Encounter (Signed)
  1. What dental office are you calling from? Dr Veneta Penton  2. What is your office phone number? [phone]  3. What is your fax number?  4. What type of procedure is the patient having performed? routine cleaning  5. What date is procedure scheduled or is the patient there now? now  6. What is your question (ex. Antibiotics prior to procedure, holding medication-we need to know how long dentist wants pt to hold med)? Does she need antibiotics

## 2018-07-31 NOTE — Telephone Encounter (Signed)
Spoke to patient. She does not needed SBE  PROPHYLAXIS- PATIENT AWARE

## 2018-08-22 ENCOUNTER — Other Ambulatory Visit: Payer: Self-pay | Admitting: Adult Health

## 2018-08-23 ENCOUNTER — Other Ambulatory Visit: Payer: Self-pay | Admitting: *Deleted

## 2018-08-23 MED ORDER — METOPROLOL SUCCINATE ER 50 MG PO TB24: 50.0000 mg | ORAL_TABLET | Freq: Two times a day (BID) | ORAL | 1 refills | Status: DC | PRN

## 2018-08-23 NOTE — Telephone Encounter (Signed)
Rx request sent to pharmacy.  

## 2018-08-29 ENCOUNTER — Ambulatory Visit (INDEPENDENT_AMBULATORY_CARE_PROVIDER_SITE_OTHER): Payer: 59 | Admitting: Internal Medicine

## 2018-08-29 ENCOUNTER — Encounter: Payer: Self-pay | Admitting: Internal Medicine

## 2018-08-29 VITALS — BP 150/84 | HR 67 | Ht 59.0 in | Wt 134.0 lb

## 2018-08-29 DIAGNOSIS — J849 Interstitial pulmonary disease, unspecified: Secondary | ICD-10-CM

## 2018-08-29 DIAGNOSIS — Z569 Unspecified problems related to employment: Secondary | ICD-10-CM

## 2018-08-29 DIAGNOSIS — J679 Hypersensitivity pneumonitis due to unspecified organic dust: Secondary | ICD-10-CM

## 2018-08-29 DIAGNOSIS — Z77120 Contact with and (suspected) exposure to mold (toxic): Secondary | ICD-10-CM | POA: Diagnosis not present

## 2018-08-29 LAB — PULMONARY FUNCTION TEST
DL/VA % pred: 116 %
DL/VA: 4.76 ml/min/mmHg/L
DLCO UNC % PRED: 105 %
DLCO unc: 18.47 ml/min/mmHg
FEF 25-75 PRE: 2.94 L/s
FEF2575-%Pred-Pre: 144 %
FEV1-%Pred-Pre: 100 %
FEV1-PRE: 2.09 L
FEV1FVC-%Pred-Pre: 112 %
FEV6-%PRED-PRE: 91 %
FEV6-Pre: 2.37 L
FEV6FVC-%PRED-PRE: 103 %
FVC-%PRED-PRE: 88 %
FVC-Pre: 2.39 L
PRE FEV1/FVC RATIO: 87 %
PRE FEV6/FVC RATIO: 100 %

## 2018-08-29 MED ORDER — PREDNISONE 5 MG PO TABS: 5.0000 mg | ORAL_TABLET | Freq: Every day | ORAL | 2 refills | Status: DC

## 2018-08-29 NOTE — Progress Notes (Signed)
OV 11/10/2017  Chief Complaint  Patient presents with  . Advice Only    Referred by Hannah Griffon, PA. Pt was in hosp. 1/4-1/10 for acute resp failure with hypoxia but states that she was mainly in there because of heart problems.  Pt does currently have a dry cough and states she is SOB today.     Hannah Colon presents for follow-up from the hospital.  She was admitted with acute hypoxemic respiratory failure with diffuse groundglass opacities.  She had his ground glass opacities on the CT scan in January 2019 but also towards the end of 2018.  Therefore interstitial lung disease was suspected.  She underwent autoimmune workup but this was negative.  At that time we did an echocardiogram and it showed severe hypertrophic obstructive cardiomyopathy and with the treatment of this hypoxemia improved and a chest x-ray improved.  But given the chronicity of the chest x-ray findings the presence of ILD was still entertained.  Therefore she presents for follow-up.  Currently she is improved.  She is able to do her bed.  She sees Hannah Colon for cardiology follow-up.  Her metoprolol has been increased.  She is slowly gaining her strength.  She is doing activities of daily living such as making the bed.  She still can get short of breath when she exerts.  Today when I exerted her she did not desaturate to significance but she did drop below 3 points suggesting the presence of interstitial infiltrates.  The exam did show crackles.  Walking desaturation test on 11/10/2017 185 feet x 3 laps on ROOM AIR:  did not desaturate. Rest pulse ox was 99%, final pulse ox was 91%. HR response 76/min at rest to 84/min at peak exertion. Patient Hannah Colon  Did not Desaturate < 88% . Hannah Colon did yes  Desaturated </= 3% points. Hannah Colon did not get [facility] note 12/31/17 los 3 days. Main issue now is severe cough despiote teassoln perles. Had RHC and PcWP 10. Bronch with BAL and TTBx  planned Monday 01/02/18 by Hannah Colon.    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  I discoveredt his only now. Last visit Feb 2019 and in Jan 2019 she denied mold exposure. 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 Says her office room  has had significant mold all along and in 2018 they cleaned it out while she was still working in office. Last worked there dec 2018. 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 Range 01/02/2018 14:54  Color, Fluid Latest Ref Range: YELLOW  COLORLESS (A)  WBC, Fluid Latest Ref Range: 0 - 1,000 cu mm 678  Lymphs, Fluid Latest Units: % 70  Eos, Fluid Latest Units: % 10  Appearance, Fluid Latest Ref Range: CLEAR  HAZY (A)  Other Cells, Fluid Latest Units: % MESOTHELIAL CELLS...  Neutrophil Count, Fluid Latest Ref Range: 0 - 25 % 14  Monocyte-Macrophage-Serous Fluid Latest Ref Range: 50 - 90 % 6 (L)    CD4: CD8 ratio 2:1   OV 01/24/2018  Chief Complaint  Patient presents with  . Follow-up    Hospitilized 3/27 for SOB. Dry cough following PFT done yesterday. Overall she reports she has been ok.    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  This is in the setting of hypertrophic  obstructive cardiomyopathy.  Symptom of severe cough  On prednisone 40m per day x 3 weeks. Cough resolved with this. YHesterday had PFT that shows restriction - and cough relapsed a bit. Husband here with her. They want to go over diagnosis. OVerall feeling well. No new issues. Wants to return to work.       Walking desaturation test on 01/24/2018 185 feet x 3 laps on ROOM AIR:  did dd not desaturate. Rest pulse ox was 97%, final pulse ox was 95%. HR response 79/min at rest to 87/min at peak exertion. Patient Hannah Colon Did not Desaturate < 88% . BBeatrix Fettersdid not  Desaturated </= 3% points. Hannah Amydid not get tachyardic   OV 03/07/2018  Chief Complaint  Patient presents with  . Follow-up    Pt states she is doing better since last visit. States she has days where fatigue is worse but other than that, she has been doing good.    Followup    ICD-10-CM   1. ILD (interstitial lung disease) (HCC) J84.9 Pulmonary function test  2. Hypersensitivity pneumonitis ([place] J67.9   3. Adverse exposure in workplace Z56.9   4. Mold exposure Z77.120     She is now finished prednisone 463mper day and since few days ago on 3085mrednisone per day. Witht his her breathing is improved but she has gained weight. PFTs show improvement to near normalization. She tells me she is still off work. Her employer is fixing mold exposure and apparently recent recheck still showed mold. There is an old building and new building. She is knowing how and under what circumstances she can go back to work and how long to take prednisone    OV 04/25/2018  Chief Complaint  Patient presents with  . Follow-up    PFT performed today.  Pt states she has been doing okay since last visit. States itching has been better since pred was lowered and insomnia is not as bad but pt states she does not have as much energy as she used to. States she does have some occ worsening SOB and also has some occ CP.     Follow-up interstitial lung disease due to hypersensitivity pneumonitis following mold exposure at the workplace  She is currently on 30 mg prednisone. The dyspnea is improved. She says however when she cut down from 40 to 30 mg prednisone she started feeling a little bit more fatigued. She still not return to work. Lung function today shows near total normalization but while on prednisone. She's avoided exposure to mold at the workplace which is critical. History retake again suggests there is no mold exposure at the home and she continues to improve while she is at the home apartment currently  She did bring in her allergy workup from  [facility] - . Looks like is serum . In April 2016 -> she was negative for mold x 11 typles including aureobasidii but in July 2019 she was positive for alternaria and aspergillus     OV 06/29/2018  Subjective:  Patient ID: Hannah Colon, female , DOB: 1956-04-06 , age 62 y.o. , MRN: [id number] , ADDRESS: [address] [place] [address]   06/29/2018 -   Chief Complaint  Patient presents with  . Follow-up    PFT performed today.  Pt states she was having chest pain 3 weeks ago and went to see her cardiologist and per cardiologist, they believe it might be coming from the prednisone so pt was prescribed a course of zegerid to see if that would help with the chest pains and even with that, she was still having chest pains and was then told to take zantac. Pt states she is still taking the prednisone and since the zantac was started 9/11, the chest pains has stopped.     HPI Hannah Colon 61 y.o. -follow-up chronic hypersensitivity pneumonitis/interstitial lung disease secondary to mold exposure in the workplace  She presents with her husband as usual.  In terms of her respiratory status she is doing well.  She is currently on prednisone 20 mg/day.  But she is dealing with side effects of prednisone and she has gained weight.  In addition  she is having significant chest pain and heartburn that prompted her to go to the cardiologist.  Finally diagnosis acid reflux and required both H2 blockade and PPI to resolve the pains.  Today the lung function test is normal and walking desaturation test is normal.  These are documented below.  She has not had a flu shot.  She is somewhat reluctant to have a flu shot but after counseling she has agreed.  She does not have any egg allergy or fever.  It appears that a hypertrophic obstructive cardiomyopathy is under control.  We again discussed the unusual presentation she had in the intensive care unit.  It appears that she might have had hypertrophic obstructive cardiomyopathy all along but it was the hypersensitivity pneumonitis that precipitated this into a crisis and resulted in dual diagnosis in the early part of 2019   OV 08/29/2018  Subjective:  Patient ID: Hannah Colon, female , DOB: 09-29-1956 , age 18 y.o. , MRN: [id number] , ADDRESS: [address] [place] [address]   08/29/2018 -   Chief Complaint  Patient presents with  . Follow-up    f/u ILD, breathing is doing well, even with exertion, she has gotten her strength back     HPI Hannah Colon 62 y.o. -presents for follow-up of interstitial lung disease secondary to hypersensitivity pneumonitis secondary to mold at the workplace.  She is currently on prednisone 10 mg/day and tolerating it well.  She has slowly gotten stronger.  She is able to exert and do her activities of daily living without issues.  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  She would ideally like to reduce the prednisone even further.  She has not returned to work and is continue to avoid mold.  There are no other new issues.  Pulmonary function test below shows stability.  We went over her mold exposures -she traces it all back to workplace exposures.  Her husband did mention that they got home mold testing done and it was negative.   The last few months  we have now developed a new interstitial lung disease questionnaire with extensive exposure history.  We will administer this to her.  She will take it home and bring it back.     Results for BOBBYJO, MARULANDA (MRN [id number]) as of 08/29/2018 15:04  Ref. Range 01/23/2018 15:25 02/23/2018 10:07 04/25/2018 10:27 06/29/2018 08:52 08/29/2018 13:46  FVC-Pre Latest Units: L 1.78 2.19 2.56 2.52 2.39  FVC-%Pred-Pre Latest Units: % 63 77 91 93 88   Results for ANAYLA, GIANNETTI (MRN [id number]) as of 08/29/2018 15:04  Ref. Range 01/23/2018 15:25 02/23/2018 10:07 04/25/2018 10:27 06/29/2018 08:52 08/29/2018 13:46  DLCO unc Latest Units: ml/min/mmHg 12.99 13.82 14.72 17.93 18.47  DLCO unc % pred Latest Units: % 68 73 78 102 105    Simple office walk 250 feet x 3 lapos 08/29/2018   O2 used Room air  Number laps completed 3  Comments about pace normal  Resting Pulse Ox/HR 100% and 66/min  Final Pulse Ox/HR 96% and 82/min  Desaturated </= 88% no  Desaturated <= 3% points yes  Got Tachycardic >/= 90/min no  Symptoms at end of test Not elicited by CMA  Miscellaneous comments x      ROS - per HPI     has a past medical history of Abnormal pulmonary finding, Anemia of chronic disease, Chronic diastolic CHF (congestive heart failure) ([place]), Environmental and seasonal allergies, High cholesterol, Hypertension, and Hypertensive heart disease (05/13/2017).   reports that she has never smoked. She has never used smokeless tobacco.  Past Surgical History:  Procedure Laterality Date  . ABDOMINAL HYSTERECTOMY    . FINGER SURGERY Right 11/22/2017  . RIGHT HEART CATH N/A 12/30/2017   Procedure: RIGHT HEART CATH;  Surgeon: Jolaine Artist, MD;  Location: [facility];  Service: Cardiovascular;  Laterality: N/A;  . SHOULDER ARTHROSCOPY Right   . VIDEO BRONCHOSCOPY Bilateral 01/02/2018   Procedure: VIDEO BRONCHOSCOPY WITH FLUORO;  Surgeon: Marshell Garfinkel, MD;  Location: [facility];  Service: Cardiopulmonary;  Laterality: Bilateral;    Allergies  Allergen Reactions  . Amoxicillin-Pot Clavulanate Rash  . Iodinated Diagnostic Agents Anaphylaxis    hypotension hypotension  . Molds & Smuts Shortness Of Breath  . Peanut Oil Swelling  . Penicillins Hives and Rash    Has patient had a PCN reaction causing immediate rash, facial/tongue/throat swelling, SOB or lightheadedness with hypotension: No Has patient had a PCN reaction causing severe rash involving mucus membranes or skin necrosis: No Has patient had a PCN reaction that required hospitalization: No Has patient had a PCN reaction occurring within the last 10 years: Yes If all of the above answers are "NO", then may proceed with Cephalosporin use. Tolerates Cefepime 10/2017.   Marland Kitchen Metrizamide Other (See Comments)    Hypotension - iodinated contrast agents    Immunization History  Administered Date(s) Administered  . Influenza,inj,Quad PF,6+ Mos 06/29/2018    Family History  Problem Relation Age of Onset  . Dementia Mother   . Asthma Maternal Grandmother   . Heart failure Maternal Uncle      Current Outpatient Medications:  .  acetaminophen (TYLENOL) 500 MG tablet, Take 1,000 mg by mouth every 6 (six) hours as needed for mild pain, moderate pain or headache., Disp: , Rfl:  .  aspirin EC 81 MG EC tablet, Take 1 tablet (81 mg total) by mouth daily., Disp: 30 tablet, Rfl: 0 .  atorvastatin (LIPITOR) 20 MG tablet, Take 40 mg by mouth daily after breakfast., Disp: , Rfl:  .  azelastine (ASTELIN) 0.1 % nasal spray, Place 1 spray into both nostrils 2 (two) times daily., Disp: , Rfl:  .  Calcium Carb-Cholecalciferol (CALCIUM + VITAMIN D3 PO), Take by mouth., Disp: , Rfl:  .  Famotidine-Ca Carb-Mag Hydrox (PEPCID COMPLETE PO), Take 1 tablet by mouth daily as needed., Disp: , Rfl:  .  fluticasone (FLONASE) 50 MCG/ACT nasal spray, Place 1 spray into both nostrils daily., Disp: , Rfl:  .  furosemide (LASIX)  20 MG tablet, Take 1 tablet (20 mg total) by mouth 2 (two) times daily as needed., Disp: 30 tablet, Rfl: 0 .  metoprolol succinate (TOPROL-XL) 50 MG 24 hr tablet, Take 2 tablets (100 mg total) by mouth daily. May take xtra 40m (1/2 tab) for increased HR or SBP>140, Disp: , Rfl:  .  metoprolol succinate (TOPROL-XL) 50 MG 24 hr tablet, Take 1 tablet (50 mg total) by mouth 2 (two) times daily as needed. Take with or immediately following a meal., Disp: 180 tablet, Rfl: 1 .  Multiple Vitamin (MULTIVITAMIN WITH MINERALS) TABS tablet, Take 1 tablet by mouth daily., Disp: , Rfl:  .  Omeprazole-Sodium Bicarbonate (ZEGERID OTC) 20-1100 MG CAPS capsule, Take 1 capsule by mouth daily before breakfast., Disp: 28 each, Rfl: 0 .  OVER THE COUNTER MEDICATION, Place 3 drops under the tongue daily. Allergy drops made up at Hannah office, Disp: , Rfl:  .  potassium chloride (K-DUR) 10 MEQ tablet, Take 1 tablet (10 mEq total) by mouth daily. Take along with lasix, Disp: 30 tablet, Rfl: 2 .  predniSONE (DELTASONE) 10 MG tablet, Take 2 tablets (20 mg total) by mouth daily with breakfast., Disp: 60 tablet, Rfl: 1 .  VOLTAREN 1 % GEL, Apply 1 application topically daily as needed (hand pain). , Disp: , Rfl:       Objective:   Vitals:   08/29/18 1434  BP: (!) 150/84  Pulse: 67  SpO2: 99%  Weight: 134 lb (60.8 kg)  Height: _0  (1.499 m)    Estimated body mass index is 27.06 kg/m as calculated from the following:   Height as of this encounter: _1  (1.499 m).   Weight as of this encounter: 134 lb (60.8 kg).  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 OXYGEN ON  - no . Patient is @ ra   Throat:   Lips, mucosa, and tongue normal; teeth and gums normal. Cyanosis on lips - no  Neck:   Supple, symmetrical, trachea midline, no adenopathy;    thyroid:  no enlargement/tenderness/nodules; no carotid   bruit or JVD  Back:     Symmetric, no curvature, ROM normal, no CVA tenderness  Lungs:     Distress - no , Wheeze no, Barrell Chest - no, Purse lip breathing - no, Crackles - no   Chest Wall:    No tenderness or deformity.    Heart:    Regular rate and rhythm, S1 and S2 normal,  no rub   or gallop, Murmur - no  Breast Exam:    NOT DONE  Abdomen:     Soft, non-tender, bowel sounds active all four quadrants,    no masses, no organomegaly. Visceral obesity - yes  Genitalia:   NOT DONE  Rectal:   NOT DONE  Extremities:   Extremities - normal, Has Cane - no, Clubbing - no, Edema - no  Pulses:   2+ and symmetric all extremities  Skin:   Stigmata of Connective Tissue Disease - no  Lymph nodes:   Cervical, supraclavicular, and axillary nodes normal  Psychiatric:  Neurologic:   Pleasant - yes, Anxious - no, Flat affect - no  CAm-ICU - neg, Alert and Oriented x 3 - yes, Moves all 4s - yes, Speech - normal, Cognition - intact           Assessment:       ICD-10-CM   1. ILD (interstitial lung disease) ([place]) J84.9   2. Hypersensitivity pneumonitis ([place]) J67.9   3. Adverse exposure in workplace Z56.9   4. Mold exposure Z77.120   5. Interstitial pulmonary disease ([place]) J84.9        Plan:     Patient Instructions     ICD-10-CM   1. ILD (interstitial lung disease) ([place]) J84.9   2. Hypersensitivity pneumonitis ([place]) J67.9   3. Adverse exposure in workplace Z56.9   4. Mold exposure Z77.120     Glad you are better and feeling stable  Glad you continue to avoid mold (I heard about this patient in academic case discussion in  a meeting in [facility] of how repeat bird exposure killed after initially she got better)  Plan Reduced prednisone to 73m per day Do the ILD questionnaire that we have developed  Followup 2 months do HRCT 2 months do spirometry and DLCO Return to ILD clinic in 2 months or sooner if needed  > 50% of this > 25 min visit spent in face to face counseling or coordination of care - by this undersigned MD - Hannah MBrand Males This includes one or more of the following documented above: discussion of test results, diagnostic or treatment recommendations, prognosis, risks and benefits of management options, instructions, education, compliance or risk-factor reduction    SIGNATURE    Hannah. MBrand Males M.D., F.C.C.P,  Pulmonary and Critical Care Medicine Staff Physician, CLydenDirector - Interstitial Lung Disease  Program  [place]ulmonary [place] [address] Pager: [phone] If no answer or between  15:00h - 7:00h: call [phone] Telephone: [phone]  3:28 PM 08/29/2018

## 2018-08-29 NOTE — Progress Notes (Signed)
HPI: Follow-up diastolic congestive heart failure and hypertrophic obstructive cardiomyopathy. Nuclear study August 2018 showed ejection fraction 70% and normal perfusion. Echocardiogram January 2019 showed ejection fraction 65-70%, severe left ventricular hypertrophy, systolic anterior motion of the mitral valve with dynamic outflow obstruction with mean gradient 29 mmHg, grade 2 diastolic dysfunction,mild to moderate mitral regurgitation, severe left atrial enlargement and mild right atrial enlargement. CTA January 2019 showed no pulmonary embolus.Cardiac MRI February 2019 consistent with hypertrophic obstructive cardiomyopathy. Holter monitor January 2019 showed no nonsustained ventricular tachycardia. Exercise treadmill January 2019 showed normal systolic blood pressure response. Patient admitted with dyspnea March 2029.  Echocardiogram showed vigorous LV systolic function and systolic anterior motion of the mitral valve but LVOT gradient only 2.5 m/s.  Probable mild to moderate aortic stenosis and mild to moderate mitral regurgitation.  Right heart catheterization March 2019 showed pulmonary capillary wedge pressure 10, PA 37/13 cardiac output 6.1.  Chest CT showed diffuse bilateral airspace disease.  She was diagnosed with interstitial lung disease and placed on steroids. Previous WU showed AUREOBASIDIUM PULLULANS- fungal antigen in water cooling systems and showers. Now followed by pulmonary. Since last seen,the patient has dyspnea with more extreme activities but not with routine activities. It is relieved with rest. It is not associated with chest pain. There is no orthopnea, PND or pedal edema. There is no syncope or palpitations. There is no exertional chest pain.   Current Outpatient Medications  Medication Sig Dispense Refill  . acetaminophen (TYLENOL) 500 MG tablet Take 1,000 mg by mouth every 6 (six) hours as needed for mild pain, moderate pain or headache.    Marland Kitchen aspirin EC 81  MG EC tablet Take 1 tablet (81 mg total) by mouth daily. 30 tablet 0  . atorvastatin (LIPITOR) 20 MG tablet Take 40 mg by mouth daily after breakfast.    . azelastine (ASTELIN) 0.1 % nasal spray Place 1 spray into both nostrils 2 (two) times daily.    . Calcium Carb-Cholecalciferol (CALCIUM + VITAMIN D3 PO) Take by mouth.    . Famotidine-Ca Carb-Mag Hydrox (PEPCID COMPLETE PO) Take 1 tablet by mouth daily as needed.    . fluticasone (FLONASE) 50 MCG/ACT nasal spray Place 1 spray into both nostrils daily.    . furosemide (LASIX) 20 MG tablet Take 1 tablet (20 mg total) by mouth 2 (two) times daily as needed. 30 tablet 0  . metoprolol succinate (TOPROL-XL) 50 MG 24 hr tablet Take 2 tablets (100 mg total) by mouth daily. May take xtra 25mg  (1/2 tab) for increased HR or SBP>140    . Multiple Vitamin (MULTIVITAMIN WITH MINERALS) TABS tablet Take 1 tablet by mouth daily.    Maxwell Caul Bicarbonate (ZEGERID OTC) 20-1100 MG CAPS capsule Take 1 capsule by mouth daily before breakfast. 28 each 0  . OVER THE COUNTER MEDICATION Place 3 drops under the tongue daily. Allergy drops made up at Dr office    . potassium chloride (K-DUR) 10 MEQ tablet Take 1 tablet (10 mEq total) by mouth daily. Take along with lasix 30 tablet 2  . predniSONE (DELTASONE) 10 MG tablet Take 2 tablets (20 mg total) by mouth daily with breakfast. 60 tablet 1  . predniSONE (DELTASONE) 5 MG tablet Take 1 tablet (5 mg total) by mouth daily with breakfast. 30 tablet 2  . VOLTAREN 1 % GEL Apply 1 application topically daily as needed (hand pain).      No current facility-administered medications for this visit.  Past Medical History:  Diagnosis Date  . Abnormal pulmonary finding    a. has been considered to have possible ILD.  Marland Kitchen. Anemia of chronic disease   . Chronic diastolic CHF (congestive heart failure) (HCC)   . Environmental and seasonal allergies   . High cholesterol   . Hypertension   . Hypertensive heart disease  05/13/2017    Past Surgical History:  Procedure Laterality Date  . ABDOMINAL HYSTERECTOMY    . FINGER SURGERY Right 11/22/2017  . RIGHT HEART CATH N/A 12/30/2017   Procedure: RIGHT HEART CATH;  Surgeon: Dolores PattyBensimhon, Daniel R, MD;  [facility];  Service: Cardiovascular;  Laterality: N/A;  . SHOULDER ARTHROSCOPY Right   . VIDEO BRONCHOSCOPY Bilateral 01/02/2018   Procedure: VIDEO BRONCHOSCOPY WITH FLUORO;  Surgeon: Chilton GreathouseMannam, Praveen, MD;  Location: [facility] ENDOSCOPY;  Service: Cardiopulmonary;  Laterality: Bilateral;    Social History   Socioeconomic History  . Marital status: Married    Spouse name: Not on file  . Number of children: Not on file  . Years of education: Not on file  . Highest education level: Not on file  Occupational History  . Occupation: Audiological scientistaccounting  Social Needs  . Financial resource strain: Not on file  . Food insecurity:    Worry: Not on file    Inability: Not on file  . Transportation needs:    Medical: Not on file    Non-medical: Not on file  Tobacco Use  . Smoking status: Never Smoker  . Smokeless tobacco: Never Used  Substance and Sexual Activity  . Alcohol use: Yes    Alcohol/week: 4.0 - 5.0 standard drinks    Types: 4 - 5 Glasses of wine per week    Comment: 1 total glass since 12/19  . Drug use: No  . Sexual activity: Not on file  Lifestyle  . Physical activity:    Days per week: Not on file    Minutes per session: Not on file  . Stress: Not on file  Relationships  . Social connections:    Talks on phone: Not on file    Gets together: Not on file    Attends religious service: Not on file    Active member of club or organization: Not on file    Attends meetings of clubs or organizations: Not on file    Relationship status: Not on file  . Intimate partner violence:    Fear of current or ex partner: Not on file    Emotionally abused: Not on file    Physically abused: Not on file    Forced sexual activity: Not on file  Other Topics  Concern  . Not on file  Social History Narrative  . Not on file    Family History  Problem Relation Age of Onset  . Dementia Mother   . Asthma Maternal Grandmother   . Heart failure Maternal Uncle     ROS: no fevers or chills, productive cough, hemoptysis, dysphasia, odynophagia, melena, hematochezia, dysuria, hematuria, rash, seizure activity, orthopnea, PND, pedal edema, claudication. Remaining systems are negative.  Physical Exam: Well-developed well-nourished in no acute distress.  Skin is warm and dry.  HEENT is normal.  Neck is supple.  Chest is clear to auscultation with normal expansion.  Cardiovascular exam is regular rate and rhythm.  Abdominal exam nontender or distended. No masses palpated. Extremities show no edema. neuro grossly intact   A/P  1 hypertrophic cardiomyopathy-as outlined in previous notes patient's dyspnea is felt  predominantly secondary to interstitial lung disease which is being treated by pulmonary.  Continue present dose of Toprol.  Continue low-dose diuretic as needed.  Likely repeat her echocardiogram when she returns in 6 months.  2 interstitial lung disease-managed by pulmonary.  Felt secondary to hypersensitivity pneumonitis.  3 chronic diastolic congestive heart failure-she does not appear to be volume overloaded on examination today.  Continue fluid restriction and low-sodium diet.  Continue Lasix as needed.  Check potassium and renal function.  4 hypertension-blood pressure is elevated; add losartan 50 mg daily.  Check potassium and renal function in 1 week.  5 valvular heart disease-mild to moderate aortic stenosis and mitral regurgitation on previous echocardiogram.  Repeat study when she returns in 6 months.  Olga Millers, MD

## 2018-08-29 NOTE — Progress Notes (Signed)
PFT completed today.  

## 2018-08-29 NOTE — Patient Instructions (Addendum)
ICD-10-CM   1. ILD (interstitial lung disease) (HCC) J84.9   2. Hypersensitivity pneumonitis (HCC) J67.9   3. Adverse exposure in workplace Z56.9   4. Mold exposure Z77.120     Glad you are better and feeling stable  Glad you continue to avoid mold (I heard about this patient in academic case discussion in a [facility] [employer] [facility] [employer] [facility] [facility] [place] of how repeat bird exposure killed after initially she got better)  Plan Reduced prednisone to 5mg  per day Do the ILD questionnaire that we have developed  Followup 2 months do HRCT 2 months do spirometry and DLCO Return to ILD clinic in 2 months or sooner if needed

## 2018-08-29 NOTE — Addendum Note (Signed)
Addended by: Cydney OkAUGUSTIN, [place] Perra N on: 08/29/2018 03:43 PM   Modules accepted: Orders

## 2018-09-06 ENCOUNTER — Ambulatory Visit (INDEPENDENT_AMBULATORY_CARE_PROVIDER_SITE_OTHER): Payer: 59 | Admitting: Cardiology

## 2018-09-06 ENCOUNTER — Encounter: Payer: Self-pay | Admitting: Cardiology

## 2018-09-06 VITALS — BP 152/70 | HR 67 | Ht 59.0 in | Wt 137.0 lb

## 2018-09-06 DIAGNOSIS — I5032 Chronic diastolic (congestive) heart failure: Secondary | ICD-10-CM | POA: Diagnosis not present

## 2018-09-06 DIAGNOSIS — I421 Obstructive hypertrophic cardiomyopathy: Secondary | ICD-10-CM | POA: Diagnosis not present

## 2018-09-06 DIAGNOSIS — I10 Essential (primary) hypertension: Secondary | ICD-10-CM | POA: Diagnosis not present

## 2018-09-06 MED ORDER — LOSARTAN POTASSIUM 50 MG PO TABS: 50.0000 mg | ORAL_TABLET | Freq: Every day | ORAL | 3 refills | Status: DC

## 2018-09-06 NOTE — Patient Instructions (Signed)
Medication Instructions:   START LOSARTAN 50 MG ONCE DAILY  Labwork:  Your physician recommends that you return for lab work in: ONE WEEK  Follow-Up:  Your physician recommends that you schedule a follow-up appointment in: 6 MONTHS WITH DR CRENSHAW PLEASE GIVE OUR OFFICE A CALL 2 MONTHS PRIOR TO THAT APPOINTMENT TIME TO SCHEDULE

## 2018-09-14 LAB — BASIC METABOLIC PANEL
BUN/Creatinine Ratio: 18 (ref 12–28)
BUN: 13 mg/dL (ref 8–27)
CALCIUM: 10 mg/dL (ref 8.7–10.3)
CHLORIDE: 102 mmol/L (ref 96–106)
CO2: 27 mmol/L (ref 20–29)
Creatinine, Ser: 0.73 mg/dL (ref 0.57–1.00)
GFR, EST AFRICAN AMERICAN: 103 mL/min/{1.73_m2} (ref >59)
GFR, EST NON AFRICAN AMERICAN: 89 mL/min/{1.73_m2} (ref >59)
Glucose: 58 mg/dL — ABNORMAL LOW (ref 65–99)
POTASSIUM: 4.5 mmol/L (ref 3.5–5.2)
SODIUM: 144 mmol/L (ref 134–144)

## 2018-10-26 ENCOUNTER — Other Ambulatory Visit (HOSPITAL_BASED_OUTPATIENT_CLINIC_OR_DEPARTMENT_OTHER): Payer: 59

## 2018-11-02 ENCOUNTER — Ambulatory Visit: Payer: 59 | Admitting: Internal Medicine

## 2018-11-02 ENCOUNTER — Ambulatory Visit: Payer: 59 | Admitting: Pulmonary Disease

## 2018-12-01 ENCOUNTER — Telehealth: Payer: Self-pay | Admitting: Internal Medicine

## 2018-12-01 ENCOUNTER — Telehealth: Payer: Self-pay | Admitting: Cardiology

## 2018-12-01 DIAGNOSIS — J849 Interstitial pulmonary disease, unspecified: Secondary | ICD-10-CM

## 2018-12-01 NOTE — Telephone Encounter (Signed)
  Due to insurance issues patient has to switch to [facility] and would like a referral to a Cardiologist within that system.

## 2018-12-01 NOTE — Telephone Encounter (Signed)
Called and spoke with Patient.  Dr Marchelle Gearing recommendations given.  Understanding stated. Pulmonary referral to [facility] placed.  Nothing further at this time.

## 2018-12-01 NOTE — Telephone Encounter (Signed)
LMTCB x1 for pt.  

## 2018-12-01 NOTE — Telephone Encounter (Signed)
Spoke with patient. She stated that she needs a referral to [facility] since her insurance has changed. [place] is no longer in network.   MR, are you ok with [place] placing this referral for her? Please advise. Thanks!

## 2018-12-01 NOTE — Telephone Encounter (Signed)
This is really sad. Could you please ask her why she went with [facility] insurance? Please refer to pulmonary Dr [place] Klaefkorn or Dr Robb Matar at [facility] - they do ILD

## 2018-12-01 NOTE — Telephone Encounter (Signed)
Informed pt that insurance will give her names of the MD's

## 2018-12-22 ENCOUNTER — Ambulatory Visit (HOSPITAL_BASED_OUTPATIENT_CLINIC_OR_DEPARTMENT_OTHER): Payer: Self-pay

## 2019-01-01 ENCOUNTER — Ambulatory Visit: Payer: Self-pay | Admitting: Internal Medicine

## 2019-01-01 ENCOUNTER — Other Ambulatory Visit: Payer: Self-pay | Admitting: Internal Medicine

## 2019-01-02 ENCOUNTER — Ambulatory Visit: Payer: Self-pay | Admitting: Internal Medicine

## 2019-01-30 ENCOUNTER — Telehealth: Payer: Self-pay | Admitting: *Deleted

## 2019-01-30 NOTE — Telephone Encounter (Signed)
Dr. Jens Som is no longer on Pt's insurance plan so she will be going to DR. Allena Katz. Deleted recall and thanked her for letting us know

## 2019-02-08 ENCOUNTER — Other Ambulatory Visit: Payer: Self-pay | Admitting: Adult Health

## 2019-02-08 NOTE — Telephone Encounter (Signed)
Metoprolol succ 50 mg refilled. 

## 2019-04-10 ENCOUNTER — Other Ambulatory Visit: Payer: Self-pay | Admitting: Cardiovascular Disease

## 2019-04-10 NOTE — Telephone Encounter (Signed)
Rx(s) sent to pharmacy electronically.  

## 2019-07-30 ENCOUNTER — Other Ambulatory Visit: Payer: Self-pay | Admitting: Cardiology

## 2019-07-30 DIAGNOSIS — I10 Essential (primary) hypertension: Secondary | ICD-10-CM

## 2019-10-16 ENCOUNTER — Other Ambulatory Visit: Payer: Self-pay | Admitting: Cardiology
